# Patient Record
Sex: Female | Born: 1955 | Race: White | Hispanic: No | Marital: Married | Smoking: Current every day smoker
Health system: Southern US, Community
[De-identification: ages and names within clinical notes are randomized; demographics above are authoritative.]

## PROBLEM LIST (undated history)

## (undated) DIAGNOSIS — J189 Pneumonia, unspecified organism: Secondary | ICD-10-CM

## (undated) DIAGNOSIS — I10 Essential (primary) hypertension: Secondary | ICD-10-CM

## (undated) DIAGNOSIS — E039 Hypothyroidism, unspecified: Secondary | ICD-10-CM

## (undated) DIAGNOSIS — M4800 Spinal stenosis, site unspecified: Secondary | ICD-10-CM

## (undated) DIAGNOSIS — N189 Chronic kidney disease, unspecified: Secondary | ICD-10-CM

## (undated) DIAGNOSIS — G43019 Migraine without aura, intractable, without status migrainosus: Secondary | ICD-10-CM

## (undated) DIAGNOSIS — Z9889 Other specified postprocedural states: Secondary | ICD-10-CM

## (undated) DIAGNOSIS — G8929 Other chronic pain: Secondary | ICD-10-CM

## (undated) DIAGNOSIS — Z8701 Personal history of pneumonia (recurrent): Secondary | ICD-10-CM

## (undated) DIAGNOSIS — I5181 Takotsubo syndrome: Secondary | ICD-10-CM

## (undated) DIAGNOSIS — D473 Essential (hemorrhagic) thrombocythemia: Secondary | ICD-10-CM

## (undated) DIAGNOSIS — F419 Anxiety disorder, unspecified: Secondary | ICD-10-CM

## (undated) DIAGNOSIS — K219 Gastro-esophageal reflux disease without esophagitis: Secondary | ICD-10-CM

## (undated) DIAGNOSIS — E538 Deficiency of other specified B group vitamins: Secondary | ICD-10-CM

## (undated) DIAGNOSIS — F5104 Psychophysiologic insomnia: Secondary | ICD-10-CM

## (undated) DIAGNOSIS — N83209 Unspecified ovarian cyst, unspecified side: Secondary | ICD-10-CM

## (undated) HISTORY — DX: Other specified postprocedural states: Z98.890

## (undated) HISTORY — DX: Takotsubo syndrome: I51.81

## (undated) HISTORY — PX: CATARACT EXTRACTION: SUR2

## (undated) HISTORY — DX: Migraine without aura, intractable, without status migrainosus: G43.019

## (undated) HISTORY — PX: DOPPLER ECHOCARDIOGRAPHY: SHX263

## (undated) HISTORY — PX: LEEP: SHX91

## (undated) HISTORY — DX: Psychophysiologic insomnia: F51.04

## (undated) HISTORY — DX: Deficiency of other specified B group vitamins: E53.8

## (undated) HISTORY — PX: RETINAL DETACHMENT REPAIR W/ SCLERAL BUCKLE LE: SHX2338

## (undated) HISTORY — PX: GUM SURGERY: SHX658

## (undated) HISTORY — DX: Essential (hemorrhagic) thrombocythemia: D47.3

## (undated) HISTORY — PX: CHOLECYSTECTOMY: SHX55

---

## 1998-04-10 ENCOUNTER — Other Ambulatory Visit: Admission: RE | Admit: 1998-04-10 | Discharge: 1998-04-10 | Payer: Self-pay | Admitting: *Deleted

## 1998-05-14 ENCOUNTER — Ambulatory Visit (HOSPITAL_COMMUNITY): Admission: RE | Admit: 1998-05-14 | Discharge: 1998-05-14 | Payer: Self-pay | Admitting: *Deleted

## 1999-08-29 ENCOUNTER — Other Ambulatory Visit: Admission: RE | Admit: 1999-08-29 | Discharge: 1999-08-29 | Payer: Self-pay | Admitting: Obstetrics and Gynecology

## 1999-08-30 ENCOUNTER — Other Ambulatory Visit: Admission: RE | Admit: 1999-08-30 | Discharge: 1999-08-30 | Payer: Self-pay | Admitting: Obstetrics and Gynecology

## 1999-08-30 ENCOUNTER — Encounter (INDEPENDENT_AMBULATORY_CARE_PROVIDER_SITE_OTHER): Payer: Self-pay

## 2000-01-24 ENCOUNTER — Encounter: Admission: RE | Admit: 2000-01-24 | Discharge: 2000-01-24 | Payer: Self-pay | Admitting: Otolaryngology

## 2000-01-24 ENCOUNTER — Encounter: Payer: Self-pay | Admitting: Otolaryngology

## 2000-08-28 ENCOUNTER — Other Ambulatory Visit: Admission: RE | Admit: 2000-08-28 | Discharge: 2000-08-28 | Payer: Self-pay | Admitting: Obstetrics and Gynecology

## 2000-09-30 ENCOUNTER — Other Ambulatory Visit: Admission: RE | Admit: 2000-09-30 | Discharge: 2000-09-30 | Payer: Self-pay | Admitting: Obstetrics and Gynecology

## 2000-09-30 ENCOUNTER — Encounter (INDEPENDENT_AMBULATORY_CARE_PROVIDER_SITE_OTHER): Payer: Self-pay | Admitting: Specialist

## 2001-09-22 ENCOUNTER — Other Ambulatory Visit: Admission: RE | Admit: 2001-09-22 | Discharge: 2001-09-22 | Payer: Self-pay | Admitting: Obstetrics and Gynecology

## 2003-02-24 ENCOUNTER — Other Ambulatory Visit: Admission: RE | Admit: 2003-02-24 | Discharge: 2003-02-24 | Payer: Self-pay | Admitting: Obstetrics and Gynecology

## 2003-09-22 ENCOUNTER — Ambulatory Visit (HOSPITAL_COMMUNITY): Admission: RE | Admit: 2003-09-22 | Discharge: 2003-09-22 | Payer: Self-pay | Admitting: General Surgery

## 2004-07-10 ENCOUNTER — Other Ambulatory Visit: Admission: RE | Admit: 2004-07-10 | Discharge: 2004-07-10 | Payer: Self-pay | Admitting: Obstetrics and Gynecology

## 2005-06-13 ENCOUNTER — Ambulatory Visit (HOSPITAL_COMMUNITY): Admission: RE | Admit: 2005-06-13 | Discharge: 2005-06-13 | Payer: Self-pay | Admitting: Family Medicine

## 2005-07-30 ENCOUNTER — Other Ambulatory Visit: Admission: RE | Admit: 2005-07-30 | Discharge: 2005-07-30 | Payer: Self-pay | Admitting: Obstetrics and Gynecology

## 2006-09-15 ENCOUNTER — Ambulatory Visit (HOSPITAL_COMMUNITY): Admission: RE | Admit: 2006-09-15 | Discharge: 2006-09-15 | Payer: Self-pay | Admitting: Family Medicine

## 2007-02-01 ENCOUNTER — Ambulatory Visit (HOSPITAL_COMMUNITY): Admission: RE | Admit: 2007-02-01 | Discharge: 2007-02-01 | Payer: Self-pay | Admitting: Family Medicine

## 2008-03-24 ENCOUNTER — Ambulatory Visit (HOSPITAL_COMMUNITY): Admission: RE | Admit: 2008-03-24 | Discharge: 2008-03-24 | Payer: Self-pay | Admitting: *Deleted

## 2008-03-24 ENCOUNTER — Encounter (INDEPENDENT_AMBULATORY_CARE_PROVIDER_SITE_OTHER): Payer: Self-pay | Admitting: *Deleted

## 2008-04-07 ENCOUNTER — Ambulatory Visit (HOSPITAL_COMMUNITY): Admission: RE | Admit: 2008-04-07 | Discharge: 2008-04-07 | Payer: Self-pay | Admitting: Family Medicine

## 2008-06-28 ENCOUNTER — Ambulatory Visit (HOSPITAL_COMMUNITY): Admission: RE | Admit: 2008-06-28 | Discharge: 2008-06-28 | Payer: Self-pay | Admitting: Family Medicine

## 2008-07-03 ENCOUNTER — Ambulatory Visit (HOSPITAL_COMMUNITY): Admission: RE | Admit: 2008-07-03 | Discharge: 2008-07-03 | Payer: Self-pay | Admitting: Family Medicine

## 2008-12-02 ENCOUNTER — Emergency Department (HOSPITAL_COMMUNITY): Admission: EM | Admit: 2008-12-02 | Discharge: 2008-12-02 | Payer: Self-pay | Admitting: Emergency Medicine

## 2009-05-09 LAB — LAB REPORT - SCANNED
EGFR: 60
TSH: 0.69 (ref 0.41–5.90)

## 2009-10-14 ENCOUNTER — Emergency Department (HOSPITAL_COMMUNITY): Admission: EM | Admit: 2009-10-14 | Discharge: 2009-10-14 | Payer: Self-pay | Admitting: Emergency Medicine

## 2010-02-18 LAB — HEMOGLOBIN A1C: A1c: 5.6

## 2010-05-17 LAB — LAB REPORT - SCANNED: TSH: 0.91 (ref 0.41–5.90)

## 2010-09-03 NOTE — Op Note (Signed)
NAMELYNDSEY, LICHTMAN                 ACCOUNT NO.:  [id number]   MEDICAL RECORD NO.:  [id number]          PATIENT TYPE:  AMB   LOCATION:  ENDO                         FACILITY:  [facility]   PHYSICIAN:  Waverly Ferrari, M.D.    DATE OF BIRTH:  24-Jan-1956   DATE OF PROCEDURE:  DATE OF DISCHARGE:                               OPERATIVE REPORT   PROCEDURE:  Upper endoscopy.   INDICATIONS:  GERD.   ANESTHESIA:  Fentanyl 100 mcg, Versed 7 mg.   PROCEDURE:  With the patient mildly sedated in the left lateral  decubitus position, the Pentax videoscopic endoscope was inserted in the  mouth, passed under direct vision through the esophagus which appeared  normal until we reached the distal esophagus and there was a question of  an area of a tongue of Barrett esophagus, photographed and biopsied.  We  entered into the stomach.  Fundus, body, antrum, duodenal bulb, and  second portion of the duodenum all appeared normal.  From this point the  endoscope was slowly withdrawn, taking circumferential views of duodenal  mucosa until the endoscope had been pulled back into stomach, placed in  retroflexion to view the stomach from below.  The endoscope was  straightened and withdrawn, taking circumferential views of remaining  gastric and esophageal mucosa.  The patient's vital signs and pulse  oximeter remained stable.  The patient tolerated the procedure well  without apparent complications.   FINDINGS:  Loose wrap around the endoscope indicating laxity of the  lower esophageal sphincter and question of Barrett esophagus, biopsied.  Await biopsy report.  The patient will call me for results and follow up  with me as an outpatient.  Proceed to colonoscopy as planned.           ______________________________  Waverly Ferrari, M.D.     GMO/MEDQ  D:  03/24/2008  T:  03/24/2008  Job:  [id number]   cc:   Bonne Dolores, M.D.  Fax: [phone]

## 2010-09-03 NOTE — Op Note (Signed)
NAMEMALIEA, HORGER                 ACCOUNT NO.:  [id number]   MEDICAL RECORD NO.:  [id number]          PATIENT TYPE:  AMB   LOCATION:  ENDO                         [facility]ACILITY:  [facility]   PHYSICIAN:  Waverly Ferrari, M.D.    DATE OF BIRTH:  17-Feb-1956   DATE OF PROCEDURE:  DATE OF DISCHARGE:                               OPERATIVE REPORT   PROCEDURE:  Colonoscopy.   INDICATIONS:  Diarrhea, colon cancer screening.   ANESTHESIA:  1. Fentanyl 35 mcg.  2. Versed 5 mg.   PROCEDURE:  With the patient mildly sedated in the left lateral  decubitus position, the Pentax videoscopic pediatric colonoscope was  inserted in the rectum and passed under direct vision with pressure  applied.  The patient turned to her back, to her right side, and  subsequently back to her back and left side.  We were able to reach the  cecum, identified by ileocecal valve and appendiceal orifice, both of  which were photographed.  From this point the colonoscope was slowly  withdrawn, taking circumferential views of the colonic mucosa, stopping  only in the rectum other than stopping for random biopsies of normal-  appearing mucosa because of the diarrhea.  The rectum appeared normal on  direct and showed hemorrhoids on retroflexed view.  The endoscope was  straightened and withdrawn.  The patient's vital signs and pulse  oximeter remained stable.  The patient tolerated the procedure well  without apparent complication.   FINDINGS:  Internal hemorrhoids, otherwise unremarkable examination.  Biopsies taken from normal-appearing mucosa to evaluate for her  diarrhea.           ______________________________  Waverly Ferrari, M.D.     GMO/MEDQ  D:  03/24/2008  T:  03/24/2008  Job:  [id number]   cc:   Bonne Dolores, M.D.  Fax: [phone]

## 2010-09-06 NOTE — Op Note (Signed)
Rita Stephens, Rita Stephens                           ACCOUNT NO.:  [id number]   MEDICAL RECORD NO.:  [id number]                   PATIENT TYPE:  AMB   LOCATION:  DAY                                  FACILITY:  [facility]   PHYSICIAN:  Jamesetta So, M.D.               DATE OF BIRTH:  [birth year]   DATE OF PROCEDURE:  09/22/2003  DATE OF DISCHARGE:                                 OPERATIVE REPORT   PREOPERATIVE DIAGNOSIS:  Cyst, left elbow.   POSTOPERATIVE DIAGNOSIS:  Cyst, left elbow, 1.5 cm.   PROCEDURE:  Excision of cyst, left elbow.   SURGEON:  Jamesetta So, M.D.   ANESTHESIA:  Regional block.   INDICATIONS FOR PROCEDURE:  The patient is a 55 year old white female who  presents with an enlarging mass just distal to her left elbow.  The risks  and benefits of the procedure, including bleeding, infection, and recurrence  of the cyst were fully explained to the patient who gave informed consent.   DESCRIPTION OF PROCEDURE:  The patient was placed in the supine position.  A  block with tourniquet was performed by anesthesia.  The left elbow was then  prepped and draped using the usual sterile technique with Betadine.  Surgical site confirmation was performed.   A longitudinal incision was made over the mass down to the subcutaneous  tissue.  The mass was excised without difficulty.  Any bleeding was  controlled using Bovie electrocautery.  The skin was reapproximated using 4-  0 nylon interrupted suture.  Betadine ointment and dry sterile dressings  were applied.  Sensorcaine 0.5% was instilled into the surrounding wound.   All tape and needle counts were correct at the end of the procedure.  The  patient was transferred to day surgery in stable condition.   COMPLICATIONS:  None.   SPECIMENS:  Cyst, left elbow   ESTIMATED BLOOD LOSS:  None.      ___________________________________________                                            Jamesetta So, M.D.   MAJ/MEDQ  D:   09/22/2003  T:  09/22/2003  Job:  [id number]   cc:   Bonne Dolores, M.D.  [address], [place] [address]  Fax: [phone]

## 2010-09-06 NOTE — H&P (Signed)
NAME:  Rita Stephens, Rita Stephens                           ACCOUNT NO.:  [id number]   MEDICAL RECORD NO.:  [id number]                   PATIENT TYPE:  AMB   LOCATION:  DAY                                  FACILITY:  [facility]   PHYSICIAN:  Jamesetta So, M.D.               DATE OF BIRTH:  02-23-1956   DATE OF ADMISSION:  DATE OF DISCHARGE:                                HISTORY & PHYSICAL   CHIEF COMPLAINT:  Enlarging mass, left forearm.   HISTORY OF PRESENT ILLNESS:  The patient is a 55 year old white female who  was referred for evaluation and treatment of a mass on her left forearm.  It  has been present for some time, but recently it has increased in size and is  causing her discomfort.  No drainage has been noted.  She would like it  remove.   PAST MEDICAL HISTORY:  1. Depression.  2. __________allergies.   PAST SURGICAL HISTORY:  Laparoscopic cholecystectomy in 1996.   CURRENT MEDICATIONS:  1. Zyrtec.  2. Lexapro.  3. Xanax.   ALLERGIES:  SUDAFED.   REVIEW OF SYSTEMS:  The patient does smoke a pack of cigarettes a day.  She  denies any significant alcohol use.  She denies any other cardiopulmonary  difficulties or bleeding disorders.   PHYSICAL EXAMINATION:  The patient is a well-developed, well-nourished white  female, in no acute distress.  VITAL SIGNS:  She is afebrile.  Vitals signs are stable.  LUNGS:  Clear to auscultation with equal breath sounds bilaterally.  HEART:  Examination reveals a regular rate and rhythm without S3, S4, or  murmurs.  EXTREMITIES:  Examination reveals a bi-lobed, mobile, 2 to 3 cm subcutaneous  mass on the left forearm, dorsal aspect, just distal to the elbow.  No  punctum is present.   IMPRESSION:  Cyst, left forearm.   PLAN:  The patient is scheduled for excision of the mass, left forearm on  September 22, 2003.  The risks and benefits of the procedure, including bleeding,  infection and recurrence of the mass, were full explained to the patient,  who gave informed consent.     ___________________________________________                                         Jamesetta So, M.D.   MAJ/MEDQ  D:  09/21/2003  T:  09/21/2003  Job:  [id number]   cc:   Bonne Dolores, M.D.  [address] [place]  [place] [address]  Fax: [phone]

## 2011-04-03 ENCOUNTER — Other Ambulatory Visit (HOSPITAL_COMMUNITY): Payer: Self-pay | Admitting: Internal Medicine

## 2011-04-03 ENCOUNTER — Encounter: Payer: Self-pay | Admitting: Internal Medicine

## 2011-04-03 ENCOUNTER — Ambulatory Visit (HOSPITAL_COMMUNITY)
Admission: RE | Admit: 2011-04-03 | Discharge: 2011-04-03 | Disposition: A | Discharge status: Home / Self Care | Payer: 59 | Source: Ambulatory Visit | Attending: Internal Medicine | Admitting: Internal Medicine

## 2011-04-03 DIAGNOSIS — R079 Chest pain, unspecified: Secondary | ICD-10-CM | POA: Insufficient documentation

## 2011-04-03 DIAGNOSIS — M549 Dorsalgia, unspecified: Secondary | ICD-10-CM

## 2011-04-03 DIAGNOSIS — M546 Pain in thoracic spine: Secondary | ICD-10-CM | POA: Insufficient documentation

## 2011-04-10 ENCOUNTER — Other Ambulatory Visit (HOSPITAL_COMMUNITY): Payer: Self-pay | Admitting: Internal Medicine

## 2011-04-10 DIAGNOSIS — R945 Abnormal results of liver function studies: Secondary | ICD-10-CM

## 2011-04-16 ENCOUNTER — Ambulatory Visit (HOSPITAL_COMMUNITY)
Admission: RE | Admit: 2011-04-16 | Discharge: 2011-04-16 | Disposition: A | Discharge status: Home / Self Care | Payer: 59 | Source: Ambulatory Visit | Attending: Internal Medicine | Admitting: Internal Medicine

## 2011-04-16 DIAGNOSIS — R748 Abnormal levels of other serum enzymes: Secondary | ICD-10-CM | POA: Insufficient documentation

## 2011-04-16 DIAGNOSIS — R945 Abnormal results of liver function studies: Secondary | ICD-10-CM | POA: Insufficient documentation

## 2011-04-28 ENCOUNTER — Other Ambulatory Visit: Payer: Self-pay | Admitting: Obstetrics and Gynecology

## 2011-05-06 ENCOUNTER — Emergency Department (HOSPITAL_COMMUNITY): Payer: 59

## 2011-05-06 ENCOUNTER — Emergency Department (HOSPITAL_COMMUNITY)
Admission: EM | Admit: 2011-05-06 | Discharge: 2011-05-06 | Disposition: A | Discharge status: Home / Self Care | Payer: 59 | Attending: Emergency Medicine | Admitting: Emergency Medicine

## 2011-05-06 ENCOUNTER — Other Ambulatory Visit: Payer: Self-pay

## 2011-05-06 ENCOUNTER — Encounter (HOSPITAL_COMMUNITY): Payer: Self-pay | Admitting: *Deleted

## 2011-05-06 DIAGNOSIS — R0602 Shortness of breath: Secondary | ICD-10-CM | POA: Insufficient documentation

## 2011-05-06 DIAGNOSIS — Z79899 Other long term (current) drug therapy: Secondary | ICD-10-CM | POA: Insufficient documentation

## 2011-05-06 DIAGNOSIS — I10 Essential (primary) hypertension: Secondary | ICD-10-CM | POA: Insufficient documentation

## 2011-05-06 DIAGNOSIS — R0609 Other forms of dyspnea: Secondary | ICD-10-CM | POA: Insufficient documentation

## 2011-05-06 DIAGNOSIS — R079 Chest pain, unspecified: Secondary | ICD-10-CM | POA: Insufficient documentation

## 2011-05-06 DIAGNOSIS — R61 Generalized hyperhidrosis: Secondary | ICD-10-CM | POA: Insufficient documentation

## 2011-05-06 DIAGNOSIS — R0989 Other specified symptoms and signs involving the circulatory and respiratory systems: Secondary | ICD-10-CM | POA: Insufficient documentation

## 2011-05-06 DIAGNOSIS — F17200 Nicotine dependence, unspecified, uncomplicated: Secondary | ICD-10-CM | POA: Insufficient documentation

## 2011-05-06 LAB — BASIC METABOLIC PANEL
Calcium: 9.3 mg/dL (ref 8.4–10.5)
GFR calc Af Amer: >90 mL/min (ref >90)
GFR calc non Af Amer: >90 mL/min (ref >90)
Sodium: 129 mEq/L — ABNORMAL LOW (ref 135–145)

## 2011-05-06 LAB — TROPONIN I: Troponin I: <0.3 ng/mL (ref <0.30)

## 2011-05-06 LAB — CBC
MCV: 90.6 fL (ref 78.0–100.0)
Platelets: 346 10*3/uL (ref 150–400)
RDW: 13.1 % (ref 11.5–15.5)
WBC: 8.8 10*3/uL (ref 4.0–10.5)

## 2011-05-06 LAB — D-DIMER, QUANTITATIVE: D-Dimer, Quant: <0.22 ug/mL-FEU (ref 0.00–0.48)

## 2011-05-06 NOTE — ED Provider Notes (Signed)
History     CSN: [id number]  Arrival date & time 05/06/11  1111   First MD Initiated Contact with Patient 05/06/11 1226      Chief Complaint  Patient presents with  . Chest Pain  . Shortness of Breath    (Consider location/radiation/quality/duration/timing/severity/associated sxs/prior treatment) HPI  Patient states she has chest tightness, dyspnea, and sweating for months.  Pain is present constantly now for at least a month.  No increasing or decreasing factors except when she takes percocet.  Risk factors positive for smoking, hypertension, no family history of heart disease.    Past Medical History  Diagnosis Date  . Migraine   . Immune deficiency disorder     Past Surgical History  Procedure Date  . Cholecystectomy     No family history on file.  History  Substance Use Topics  . Smoking status: Current Everyday Smoker  . Smokeless tobacco: Not on file  . Alcohol Use: No    OB History    Grav Para Term Preterm Abortions TAB SAB Ect Mult Living                  Review of Systems  All other systems reviewed and are negative.    Allergies  Sudafed  Home Medications   Current Outpatient Rx  Name Route Sig Dispense Refill  . ALPRAZOLAM 1 MG PO TABS Oral Take 1 mg by mouth 3 (three) times daily as needed. For anxiety    . CALCIUM + D PO Oral Take 1 tablet by mouth daily.    . COQ-10 PO Oral Take 1 tablet by mouth daily.    . DULOXETINE HCL 60 MG PO CPEP Oral Take 60 mg by mouth daily.    [name] Kitchen LEVOTHYROXINE SODIUM 75 MCG PO TABS Oral Take 75 mcg by mouth daily.    [name] Kitchen METHOCARBAMOL 750 MG PO TABS Oral Take 750 mg by mouth 3 (three) times daily.    . OXYCODONE-ACETAMINOPHEN 7.5-325 MG PO TABS Oral Take 1 tablet by mouth every 4 (four) hours as needed. For pain    . TRAZODONE HCL 100 MG PO TABS Oral Take 100 mg by mouth at bedtime.    [name] Kitchen VITAMIN B-12 500 MCG PO TABS Oral Take 500 mcg by mouth daily.      BP 131/74  Pulse 71  Temp(Src) 98.6 F (37 C) (Oral)   Resp 20  SpO2 100%  Physical Exam  Vitals reviewed. Constitutional: She is oriented to person, place, and time. She appears well-developed and well-nourished.  HENT:  Head: Normocephalic and atraumatic.  Eyes: Conjunctivae and EOM are normal. Pupils are equal, round, and reactive to light.  Neck: Normal range of motion. Neck supple.  Cardiovascular: Normal rate, regular rhythm and normal heart sounds.   Pulmonary/Chest: Effort normal and breath sounds normal.  Abdominal: Soft. Bowel sounds are normal.  Neurological: She is alert and oriented to person, place, and time. She has normal reflexes.  Skin: Skin is warm and dry.  Psychiatric: She has a normal mood and affect.    ED Course  Procedures (including critical care time)   Labs Reviewed  CBC  BASIC METABOLIC PANEL  TROPONIN I   Dg Chest 2 View  05/06/2011  *RADIOLOGY REPORT*  Clinical Data: Chest and mid back pain.  CHEST - 2 VIEW  Comparison: PA and lateral chest 04/03/2011.  Findings: The lungs are clear.  Heart size is normal.  No pneumothorax or effusion.  No focal bony abnormality.  IMPRESSION: No acute disease.  Original Report Authenticated By: Arvid Right. Luther Parody, M.D.     No diagnosis found.   Date: 05/06/2011  Rate: 79  Rhythm: normal sinus rhythm  QRS Axis: right  Intervals: normal  ST/T Wave abnormalities: normal  Conduction Disutrbances:none  Narrative Interpretation:   Old EKG Reviewed: none available  Results for orders placed during the hospital encounter of [id number]  BASIC METABOLIC PANEL      Component Value Range   Sodium 129 (*) 135 - 145 (mEq/L)   Potassium 4.2  3.5 - 5.1 (mEq/L)   Chloride 92 (*) 96 - 112 (mEq/L)   CO2 29  19 - 32 (mEq/L)   Glucose, Bld 90  70 - 99 (mg/dL)   BUN 9  6 - 23 (mg/dL)   Creatinine, Ser 0.60  0.50 - 1.10 (mg/dL)   Calcium 9.3  8.4 - 10.5 (mg/dL)   GFR calc non Af Amer >90  >90 (mL/min)   GFR calc Af Amer >90  >90 (mL/min)  TROPONIN I      Component Value  Range   Troponin I <0.30  <0.30 (ng/mL)  CBC      Component Value Range   WBC 8.8  4.0 - 10.5 (K/uL)   RBC 4.14  3.87 - 5.11 (MIL/uL)   Hemoglobin 13.0  12.0 - 15.0 (g/dL)   HCT 37.5  36.0 - 46.0 (%)   MCV 90.6  78.0 - 100.0 (fL)   MCH 31.4  26.0 - 34.0 (pg)   MCHC 34.7  30.0 - 36.0 (g/dL)   RDW 13.1  11.5 - 15.5 (%)   Platelets 346  150 - 400 (K/uL)  D-DIMER, QUANTITATIVE      Component Value Range   D-Dimer, Quant <0.22  0.00 - 0.48 (ug/mL-FEU)     MDM  With pain that has been constant for several months which she states only results of Percocet. Followup arranged with cardiology prior to her evaluation here. The cardiac ischemia on EKG or. Chest x-Dodd Schmid is clear without any evidence of pneumothorax or infiltrate. Her d-dimer is normal.      12:46 PM Patient not in room yet.   Shaune Pollack, MD 05/06/11 [phone]

## 2011-05-06 NOTE — ED Notes (Signed)
Pt is here with intermittent chest pain with radiation and sts that it has been intermittent for couple of months.  Pt sts tightness in chest.  Reports sob and diaphoresis

## 2011-05-06 NOTE — ED Notes (Signed)
Pt discharged home. Had no further questions. Ambulatory to discharge.

## 2011-05-08 ENCOUNTER — Institutional Professional Consult (permissible substitution): Payer: 59 | Admitting: Cardiology

## 2011-05-23 ENCOUNTER — Encounter: Payer: Self-pay | Admitting: *Deleted

## 2011-05-26 ENCOUNTER — Ambulatory Visit (INDEPENDENT_AMBULATORY_CARE_PROVIDER_SITE_OTHER): Payer: 59 | Admitting: Internal Medicine

## 2011-05-26 ENCOUNTER — Telehealth: Payer: Self-pay | Admitting: Internal Medicine

## 2011-05-26 ENCOUNTER — Encounter: Payer: Self-pay | Admitting: Internal Medicine

## 2011-05-26 DIAGNOSIS — R079 Chest pain, unspecified: Secondary | ICD-10-CM | POA: Insufficient documentation

## 2011-05-26 DIAGNOSIS — Z720 Tobacco use: Secondary | ICD-10-CM

## 2011-05-26 DIAGNOSIS — Z0000 Encounter for general adult medical examination without abnormal findings: Secondary | ICD-10-CM | POA: Insufficient documentation

## 2011-05-26 DIAGNOSIS — F17200 Nicotine dependence, unspecified, uncomplicated: Secondary | ICD-10-CM

## 2011-05-26 DIAGNOSIS — I10 Essential (primary) hypertension: Secondary | ICD-10-CM | POA: Insufficient documentation

## 2011-05-26 MED ORDER — OMEPRAZOLE 40 MG PO CPDR: 40.0000 mg | DELAYED_RELEASE_CAPSULE | Freq: Every day | ORAL | Status: DC

## 2011-05-26 MED ORDER — VARENICLINE TARTRATE 1 MG PO TABS: 1.0000 mg | ORAL_TABLET | Freq: Two times a day (BID) | ORAL | Status: AC

## 2011-05-26 MED ORDER — VARENICLINE TARTRATE 0.5 MG PO TABS: 0.5000 mg | ORAL_TABLET | Freq: Two times a day (BID) | ORAL | Status: AC

## 2011-05-26 NOTE — Assessment & Plan Note (Signed)
Counselled  Willing to try Chantix.

## 2011-05-26 NOTE — Progress Notes (Signed)
HPI Patient is a 56 year old who was referred from the ER for evaluation of CP. The patient was seen in ER on 1/15.  Complained of chest tightness, dyspnea that was constant for at least a month.  Sent home for cardiac f/u. Still having discomfort  For at least 2 months.  Was constant.  Hurts to wear bra.    Bad in December.  Whole chest would be hurting.  Back hurts bad.  Sometimes get hot, sweat.  Occcasional R shoullder pain. Has a history of HTN, finally controlled.  CP is different from the chest tightness.  Chest pain mid and R upper chest.  occurs at least 1x per day. Not associated with activity.  Breathing is worse than it used to be.  Also not increased indigestion.  Used to be on meds for GERD.  Has had GB out. Does note increased stress.  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 Does go to gym  [place] in swimming pool.,  OCcasionally uses machines. Willing to try Chantix again  Did quit once with it. Allergies  Allergen Reactions  . Sudafed (Pseudoephedrine Hcl)     Climbs walls    Current Outpatient Prescriptions  Medication Sig Dispense Refill  . ALPRAZolam (XANAX) 1 MG tablet Take 1 mg by mouth 3 (three) times daily as needed. For anxiety      . Calcium Carbonate-Vitamin D (CALCIUM + D PO) Take 1 tablet by mouth daily.      . Coenzyme Q10 (COQ-10 PO) Take 1 tablet by mouth daily.      . DULoxetine (CYMBALTA) 60 MG capsule Take 60 mg by mouth daily.      [name] Kitchen levothyroxine (SYNTHROID, LEVOTHROID) 75 MCG tablet Take 75 mcg by mouth daily.      [name] Kitchen olmesartan-hydrochlorothiazide (BENICAR HCT) 20-12.5 MG per tablet Take 1 tablet by mouth daily.      . traZODone (DESYREL) 100 MG tablet Take 100 mg by mouth at bedtime.      . vitamin B-12 (CYANOCOBALAMIN) 500 MCG tablet Take 500 mcg by mouth daily.        Past Medical History  Diagnosis Date  . Migraine   . Immune deficiency disorder   . Chest pain   . HTN (hypertension)   . SOB (shortness of breath)     Past  Surgical History  Procedure Date  . Cholecystectomy   . Cardiac catheterization     No family history on file.  History   Social History  . Marital Status: Married    Spouse Name: N/A    Number of Children: N/A  . Years of Education: N/A   Occupational History  . Not on file.   Social History Main Topics  . Smoking status: Current Everyday Smoker  . Smokeless tobacco: Not on file  . Alcohol Use: No  . Drug Use: No  . Sexually Active:    Other Topics Concern  . Not on file   Social History Narrative  . No narrative on file    Review of Systems:  All systems reviewed.  They are negative to the above problem except as previously stated.  Vital Signs: BP 110/70  Pulse 71  Ht 5\' 2"  (1.575 m)  Wt 145 lb (65.772 kg)  BMI 26.52 kg/m2  Physical Exam Patient is in NAD HEENT:  Normocephalic, atraumatic. EOMI, PERRLA.  Neck: JVP is normal. No thyromegaly. No bruits.  Lungs: clear to auscultation.  Some deceased airflow.  No rales no wheezes.  Chest:  Mild tender in Right upper chest. Heart: Regular rate and rhythm. Normal S1, S2. No S3.   No significant murmurs. PMI not displaced.  Abdomen:  Supple, nontender. Normal bowel sounds. No masses. No hepatomegaly.  Extremities:   Good distal pulses throughout. No lower extremity edema.  Musculoskeletal :moving all extremities.  Neuro:   alert and oriented x3.  CN II-XII grossly intact.  EKG:  SR  71.    Assessment and Plan:

## 2011-05-26 NOTE — Assessment & Plan Note (Signed)
CP is very atypical.  I do nto think it represents cardiac ischemia.  Follow.  Counselled on exercise, quituitting tobacco.

## 2011-05-26 NOTE — Telephone Encounter (Signed)
New problem phamacy wants to verify quantity of chantix please call back

## 2011-05-26 NOTE — Patient Instructions (Addendum)
Call us in 3 weeks to let us know if your chest pain is better.  Fasting Lipid Panel at [facility]. We will call you with results.  Will let you know about Chantix.

## 2011-05-26 NOTE — Assessment & Plan Note (Signed)
Adequate control. 

## 2011-05-26 NOTE — Telephone Encounter (Signed)
Reviewed instructions for Chantix.

## 2011-05-29 ENCOUNTER — Encounter: Payer: Self-pay | Admitting: Internal Medicine

## 2011-05-29 ENCOUNTER — Other Ambulatory Visit: Payer: Self-pay | Admitting: Internal Medicine

## 2011-05-29 LAB — LIPID PANEL
Cholesterol: 185 mg/dL (ref 0–200)
LDL Cholesterol: 120 mg/dL — ABNORMAL HIGH (ref 0–99)
Triglycerides: 92 mg/dL (ref <150)

## 2011-07-03 ENCOUNTER — Telehealth: Payer: Self-pay | Admitting: Internal Medicine

## 2011-07-03 NOTE — Telephone Encounter (Signed)
Gave patient [place] number

## 2011-07-03 NOTE — Telephone Encounter (Signed)
New msg Pt wants to know number to nutritionist that she was referred to. Please call her back

## 2011-08-14 ENCOUNTER — Ambulatory Visit: Payer: 59 | Admitting: *Deleted

## 2012-02-06 LAB — LAB REPORT - SCANNED
EGFR: 85
TSH: 0.74 (ref 0.41–5.90)

## 2012-04-02 ENCOUNTER — Other Ambulatory Visit: Payer: Self-pay

## 2012-04-02 DIAGNOSIS — G4700 Insomnia, unspecified: Secondary | ICD-10-CM

## 2012-04-09 ENCOUNTER — Ambulatory Visit: Payer: 59 | Attending: Neurology | Admitting: Sleep Medicine

## 2012-04-09 DIAGNOSIS — R0609 Other forms of dyspnea: Secondary | ICD-10-CM | POA: Insufficient documentation

## 2012-04-09 DIAGNOSIS — G4700 Insomnia, unspecified: Secondary | ICD-10-CM

## 2012-04-09 DIAGNOSIS — R5381 Other malaise: Secondary | ICD-10-CM | POA: Insufficient documentation

## 2012-04-09 DIAGNOSIS — R0989 Other specified symptoms and signs involving the circulatory and respiratory systems: Secondary | ICD-10-CM | POA: Insufficient documentation

## 2012-04-24 NOTE — Procedures (Signed)
Rita A. Merlene Laughter, MD     [email or web address]        Rita Stephens, Rita Stephens                 ACCOUNT NO.:  [id number]  MEDICAL RECORD NO.:  [id number]          PATIENT TYPE:  OUT  LOCATION:  SLEEP LAB                     FACILITY:  [facility]  PHYSICIAN:  [name], M.D. DATE OF BIRTH:  [birth year]  DATE OF STUDY:  04/09/2012                           NOCTURNAL POLYSOMNOGRAM  REFERRING PHYSICIAN:  [name], M.D.  REFERRING PHYSICIAN:  [name], M.D.  INDICATION:  A 57 year old, who presents with fatigue, snoring, and difficulty sleeping.  The study is being done to evaluate for obstructive sleep apnea syndrome.  MEDICATIONS:  Alprazolam, Neurontin, trazodone, levothyroxine, melatonin, and Mobic.  EPWORTH SLEEPINESS SCALE:  0.  BMI:  21.  ARCHITECTURAL SUMMARY:  The total recording time is 422 minutes.  Sleep efficiency 96%, sleep latency 1.5 minutes, REM latency 128 minutes. Stage N1 3% N2 is 46%, N3 27% and REM sleep 24%.  RESPIRATORY SUMMARY:  Baseline oxygen saturation is 97, lowest saturation 83 during REM sleep.  Diagnostic AHI is 1.5 and RDI 2.2.  LIMB MOVEMENT SUMMARY:  PLM index 0.  ELECTROCARDIOGRAM SUMMARY:  Average heart rate is 69, with no significant dysrhythmias observed.  IMPRESSION:  Unremarkable nocturnal polysomnography.   Virgel Haro A. Merlene Stephens, M.D.    KAD/MEDQ  D:  04/24/2012 15:56:37  T:  04/24/2012 17:26:29  Job:  [id number]

## 2012-09-17 ENCOUNTER — Emergency Department (HOSPITAL_COMMUNITY)
Admission: EM | Admit: 2012-09-17 | Discharge: 2012-09-17 | Disposition: A | Discharge status: Home / Self Care | Payer: 59 | Attending: Emergency Medicine | Admitting: Emergency Medicine

## 2012-09-17 ENCOUNTER — Encounter (HOSPITAL_COMMUNITY): Payer: Self-pay | Admitting: *Deleted

## 2012-09-17 DIAGNOSIS — F17200 Nicotine dependence, unspecified, uncomplicated: Secondary | ICD-10-CM | POA: Insufficient documentation

## 2012-09-17 DIAGNOSIS — G43909 Migraine, unspecified, not intractable, without status migrainosus: Secondary | ICD-10-CM | POA: Insufficient documentation

## 2012-09-17 DIAGNOSIS — Z79899 Other long term (current) drug therapy: Secondary | ICD-10-CM | POA: Insufficient documentation

## 2012-09-17 DIAGNOSIS — R51 Headache: Secondary | ICD-10-CM | POA: Insufficient documentation

## 2012-09-17 DIAGNOSIS — Z8639 Personal history of other endocrine, nutritional and metabolic disease: Secondary | ICD-10-CM | POA: Insufficient documentation

## 2012-09-17 DIAGNOSIS — I10 Essential (primary) hypertension: Secondary | ICD-10-CM | POA: Insufficient documentation

## 2012-09-17 DIAGNOSIS — Z8709 Personal history of other diseases of the respiratory system: Secondary | ICD-10-CM | POA: Insufficient documentation

## 2012-09-17 DIAGNOSIS — Z862 Personal history of diseases of the blood and blood-forming organs and certain disorders involving the immune mechanism: Secondary | ICD-10-CM | POA: Insufficient documentation

## 2012-09-17 DIAGNOSIS — H53149 Visual discomfort, unspecified: Secondary | ICD-10-CM | POA: Insufficient documentation

## 2012-09-17 DIAGNOSIS — Z8679 Personal history of other diseases of the circulatory system: Secondary | ICD-10-CM | POA: Insufficient documentation

## 2012-09-17 DIAGNOSIS — Z9861 Coronary angioplasty status: Secondary | ICD-10-CM | POA: Insufficient documentation

## 2012-09-17 DIAGNOSIS — Z9089 Acquired absence of other organs: Secondary | ICD-10-CM | POA: Insufficient documentation

## 2012-09-17 DIAGNOSIS — R110 Nausea: Secondary | ICD-10-CM | POA: Insufficient documentation

## 2012-09-17 MED ORDER — DIPHENHYDRAMINE HCL 50 MG/ML IJ SOLN
25.0000 mg | Freq: Once | INTRAMUSCULAR | Status: AC
  Administered 2012-09-17: 25 mg via INTRAVENOUS
  Filled 2012-09-17: qty 1

## 2012-09-17 MED ORDER — OXYCODONE-ACETAMINOPHEN 5-325 MG PO TABS
1.0000 | ORAL_TABLET | Freq: Once | ORAL | Status: AC
  Administered 2012-09-17: 1 via ORAL
  Filled 2012-09-17: qty 1

## 2012-09-17 MED ORDER — DEXAMETHASONE SODIUM PHOSPHATE 4 MG/ML IJ SOLN
10.0000 mg | Freq: Once | INTRAMUSCULAR | Status: AC
  Administered 2012-09-17: 10 mg via INTRAVENOUS
  Filled 2012-09-17: qty 2
  Filled 2012-09-17: qty 1

## 2012-09-17 MED ORDER — METOCLOPRAMIDE HCL 5 MG/ML IJ SOLN
10.0000 mg | Freq: Once | INTRAMUSCULAR | Status: AC
  Administered 2012-09-17: 10 mg via INTRAVENOUS
  Filled 2012-09-17: qty 2

## 2012-09-17 MED ORDER — SODIUM CHLORIDE 0.9 % IV SOLN
INTRAVENOUS | Status: AC
  Administered 2012-09-17: 23:00:00 via INTRAVENOUS

## 2012-09-17 NOTE — ED Provider Notes (Signed)
History     CSN: [id number]  Arrival date & time 09/17/12  2032   First MD Initiated Contact with Patient 09/17/12 2152      Chief Complaint  Patient presents with  . Headache    (Consider location/radiation/quality/duration/timing/severity/associated sxs/prior treatment) Patient is a 57 y.o. female presenting with headaches. The history is provided by the patient.  Headache Pain location:  Frontal Radiates to:  Does not radiate Severity currently:  8/10 Onset quality:  Gradual Duration:  4 days Timing:  Constant Progression:  Worsening Chronicity:  New Similar to prior headaches: yes   Relieved by:  Nothing Ineffective treatments:  Aspirin Associated symptoms: nausea and photophobia   Associated symptoms: no abdominal pain, no fever, no neck pain, no sore throat and no vomiting    Rita Stephens is a 57 y.o. female who presents to the ED with a headache. The headache started 4 days ago. Yesterday it was so bad she went to her PCP and had 2 injections. She was better for a while but the the headache came back. This is like the migraines she usually has. She has nausea but no vomiting. The light and noise make the headache worse. The pain is located in the frontal area. She took a [facility] Powder earlier without relief.   Past Medical History  Diagnosis Date  . Migraine   . Immune deficiency disorder   . Chest pain   . HTN (hypertension)   . SOB (shortness of breath)     Past Surgical History  Procedure Laterality Date  . Cholecystectomy    . Cardiac catheterization      History reviewed. No pertinent family history.  History  Substance Use Topics  . Smoking status: Current Every Day Smoker  . Smokeless tobacco: Not on file  . Alcohol Use: No    OB History   Grav Para Term Preterm Abortions TAB SAB Ect Mult Living                  Review of Systems  Constitutional: Negative for fever and chills.  HENT: Negative for sore throat and neck pain.   Eyes: Positive  for photophobia.  Respiratory: Negative for shortness of breath.   Gastrointestinal: Positive for nausea. Negative for vomiting and abdominal pain.  Skin: Negative for rash.  Neurological: Positive for headaches.  Psychiatric/Behavioral: The patient is not nervous/anxious.     Allergies  Actifed cold-allergy; Sudafed; and Other  Home Medications   Current Outpatient Rx  Name  Route  Sig  Dispense  Refill  . ALPRAZolam (XANAX) 1 MG tablet   Oral   Take 1 mg by mouth 3 (three) times daily as needed. For anxiety         . aspirin-acetaminophen-caffeine (MIGRAINE FORMULA) 250-250-65 MG per tablet   Oral   Take 1 tablet by mouth once as needed for pain.         . Aspirin-Salicylamide-Caffeine (BC HEADACHE) 325-95-16 MG TABS   Oral   Take 1 Package by mouth daily as needed (for headache pain).         [name] Kitchen BIOTIN PO   Oral   Take 1 tablet by mouth daily.         . DULoxetine (CYMBALTA) 60 MG capsule   Oral   Take 60 mg by mouth daily.         [name] Kitchen levocetirizine (XYZAL) 5 MG tablet   Oral   Take 5 mg by mouth every evening.         [name] Kitchen  levothyroxine (SYNTHROID, LEVOTHROID) 112 MCG tablet   Oral   Take 112 mcg by mouth daily before breakfast.         . olmesartan (BENICAR) 40 MG tablet   Oral   Take 40 mg by mouth daily.         . traZODone (DESYREL) 100 MG tablet   Oral   Take 100 mg by mouth at bedtime.           BP 188/93  Pulse 68  Temp(Src) 97.2 F (36.2 C) (Oral)  Resp 18  Ht 5\' 2"  (1.575 m)  Wt 145 lb (65.772 kg)  BMI 26.51 kg/m2  SpO2 100%  Physical Exam  Nursing note and vitals reviewed. Constitutional: She is oriented to person, place, and time. She appears well-developed and well-nourished. No distress.  HENT:  Head: Normocephalic and atraumatic.  Right Ear: Tympanic membrane normal.  Left Ear: Tympanic membrane normal.  Nose: Nose normal. Right sinus exhibits no maxillary sinus tenderness and no frontal sinus tenderness. Left sinus  exhibits no maxillary sinus tenderness and no frontal sinus tenderness.  Mouth/Throat: Uvula is midline, oropharynx is clear and moist and mucous membranes are normal.  Eyes: Conjunctivae are normal. Pupils are equal, round, and reactive to light. Left eye exhibits nystagmus.  Neck: Normal range of motion. Neck supple.  Cardiovascular: Normal rate and regular rhythm.   Pulmonary/Chest: Effort normal.  Abdominal: Soft. Bowel sounds are normal. There is no tenderness.  Musculoskeletal: Normal range of motion.  Neurological: She is alert and oriented to person, place, and time. She has normal strength and normal reflexes. No cranial nerve deficit or sensory deficit. She displays a negative Romberg sign. Coordination and gait normal.  Skin: Skin is warm and dry.  Psychiatric: She has a normal mood and affect. Her behavior is normal. Judgment normal.    ED Course  Procedures (including critical care time) IV hydration, Reglan 10 mg IV, Benadryl 25 mg IV, Decadron 10 mg IV Observation. Feeling better since medication. Still has some pain on the right side, frontal. Will give percocet x1 tab.  MDM   Patient feeling better and ready to go home. Will d/c home and she will follow up with her doctor.  Discussed with the patient plan of care and all questioned fully answered. She will return if any problems arise.        Ashley [place], [place] 09/17/12 [phone]

## 2012-09-17 NOTE — ED Notes (Signed)
Pt alert & oriented x4, stable gait. Patient given discharge instructions, paperwork & prescription(s). Patient  instructed to stop at the registration desk to finish any additional paperwork. Patient verbalized understanding. Pt left department w/ no further questions. 

## 2012-09-17 NOTE — ED Notes (Signed)
Headache for 4 days, Seen by MD yesterday and given injections , but pain came back.  Nausea, no vomiting.   No HI.

## 2012-09-17 NOTE — ED Provider Notes (Signed)
Medical screening examination/treatment/procedure(s) were performed by non-physician practitioner and as supervising physician I was immediately available for consultation/collaboration.   Mervin Kung, MD 09/17/12 [phone]

## 2012-09-19 ENCOUNTER — Emergency Department (HOSPITAL_COMMUNITY)
Admission: EM | Admit: 2012-09-19 | Discharge: 2012-09-19 | Disposition: A | Discharge status: Home / Self Care | Payer: 59 | Attending: Emergency Medicine | Admitting: Emergency Medicine

## 2012-09-19 ENCOUNTER — Emergency Department (HOSPITAL_COMMUNITY): Payer: 59

## 2012-09-19 ENCOUNTER — Encounter (HOSPITAL_COMMUNITY): Payer: Self-pay | Admitting: Emergency Medicine

## 2012-09-19 DIAGNOSIS — F411 Generalized anxiety disorder: Secondary | ICD-10-CM | POA: Insufficient documentation

## 2012-09-19 DIAGNOSIS — Z8742 Personal history of other diseases of the female genital tract: Secondary | ICD-10-CM | POA: Insufficient documentation

## 2012-09-19 DIAGNOSIS — I10 Essential (primary) hypertension: Secondary | ICD-10-CM | POA: Insufficient documentation

## 2012-09-19 DIAGNOSIS — F17200 Nicotine dependence, unspecified, uncomplicated: Secondary | ICD-10-CM | POA: Insufficient documentation

## 2012-09-19 DIAGNOSIS — Z79899 Other long term (current) drug therapy: Secondary | ICD-10-CM | POA: Insufficient documentation

## 2012-09-19 DIAGNOSIS — E079 Disorder of thyroid, unspecified: Secondary | ICD-10-CM | POA: Insufficient documentation

## 2012-09-19 DIAGNOSIS — Z7982 Long term (current) use of aspirin: Secondary | ICD-10-CM | POA: Insufficient documentation

## 2012-09-19 DIAGNOSIS — Z8701 Personal history of pneumonia (recurrent): Secondary | ICD-10-CM | POA: Insufficient documentation

## 2012-09-19 DIAGNOSIS — G43909 Migraine, unspecified, not intractable, without status migrainosus: Secondary | ICD-10-CM | POA: Insufficient documentation

## 2012-09-19 HISTORY — DX: Unspecified ovarian cyst, unspecified side: N83.209

## 2012-09-19 HISTORY — DX: Anxiety disorder, unspecified: F41.9

## 2012-09-19 HISTORY — DX: Pneumonia, unspecified organism: J18.9

## 2012-09-19 MED ORDER — METOCLOPRAMIDE HCL 5 MG/ML IJ SOLN
10.0000 mg | Freq: Once | INTRAMUSCULAR | Status: AC
  Administered 2012-09-19: 10 mg via INTRAVENOUS
  Filled 2012-09-19: qty 2

## 2012-09-19 MED ORDER — DIPHENHYDRAMINE HCL 50 MG/ML IJ SOLN
25.0000 mg | Freq: Once | INTRAMUSCULAR | Status: AC
  Administered 2012-09-19: 25 mg via INTRAVENOUS
  Filled 2012-09-19: qty 1

## 2012-09-19 MED ORDER — KETOROLAC TROMETHAMINE 30 MG/ML IJ SOLN
30.0000 mg | Freq: Once | INTRAMUSCULAR | Status: AC
  Administered 2012-09-19: 30 mg via INTRAVENOUS
  Filled 2012-09-19: qty 1

## 2012-09-19 MED ORDER — SODIUM CHLORIDE 0.9 % IV BOLUS (SEPSIS)
1000.0000 mL | Freq: Once | INTRAVENOUS | Status: AC
  Administered 2012-09-19: 1000 mL via INTRAVENOUS

## 2012-09-19 NOTE — ED Notes (Signed)
Pt states headache is better after meds.

## 2012-09-19 NOTE — ED Provider Notes (Signed)
History  This chart was scribed for Ezequiel Essex, MD by Frederich Balding, ED Scribe. This patient was seen in room APA17/APA17 and the patient's care was started at 10:23 AM.  CSN: [id number]  Arrival date & time 09/19/12  1008    Chief Complaint  Patient presents with  . Migraine    The history is provided by the patient. No language interpreter was used.    HPI Comments: Rita Stephens is a 57 y.o. female who presents to the Emergency Department complaining of gradual onset, gradually worsening migraine that started Tuesday 5/26. She states that she saw her doctor on Thursday and received medication with some improvement but she states that her HA returned on Thursday. Pt states that she was seen here on Friday night for the same things. She states that her HA returned yesterday at 4 PM. Pt states she has has a h/o migraines and it's never been this bad. She states she is nauseous and she is sensitive to light and noise. Pt states she is not able to sleep. Pt denies fever, neck pain, sore throat, CP, cough, SOB, abdominal pain, emesis, diarrhea, urinary symptoms, back pain, weakness, numbness and rash as associated symptoms. She states that she sees spots. Pt states she normally take OTC medications for her migraines with no relief.   Past Medical History  Diagnosis Date  . Migraine   . Chest pain   . HTN (hypertension)   . SOB (shortness of breath)   . Pneumonia   . Anxiety   . Thyroid disease   . Ovarian cyst     Past Surgical History  Procedure Laterality Date  . Cholecystectomy    . Doppler echocardiography    . Ovarian biopsy      No family history on file.  History  Substance Use Topics  . Smoking status: Current Every Day Smoker  . Smokeless tobacco: Not on file  . Alcohol Use: No    OB History   Grav Para Term Preterm Abortions TAB SAB Ect Mult Living                  Review of Systems  A complete 10 system review of systems was obtained and all systems  are negative except as noted in the HPI and PMH.   Allergies  Actifed cold-allergy; Sudafed; and Other  Home Medications   Current Outpatient Rx  Name  Route  Sig  Dispense  Refill  . ALPRAZolam (XANAX) 1 MG tablet   Oral   Take 1 mg by mouth 3 (three) times daily as needed. For anxiety         . aspirin-acetaminophen-caffeine (MIGRAINE FORMULA) 250-250-65 MG per tablet   Oral   Take 1 tablet by mouth once as needed for pain.         . Aspirin-Salicylamide-Caffeine (BC HEADACHE) 325-95-16 MG TABS   Oral   Take 1 Package by mouth daily as needed (for headache pain).         [name] Kitchen BIOTIN PO   Oral   Take 1 tablet by mouth daily.         . DULoxetine (CYMBALTA) 60 MG capsule   Oral   Take 60 mg by mouth daily.         [name] Kitchen levocetirizine (XYZAL) 5 MG tablet   Oral   Take 5 mg by mouth every evening.         [name] Kitchen levothyroxine (SYNTHROID, LEVOTHROID) 112 MCG tablet   Oral  Take 112 mcg by mouth daily before breakfast.         . olmesartan (BENICAR) 40 MG tablet   Oral   Take 40 mg by mouth daily.         . traZODone (DESYREL) 100 MG tablet   Oral   Take 100 mg by mouth at bedtime.           BP 161/91  Pulse 68  Temp(Src) 97.6 F (36.4 C) (Oral)  Resp 20  SpO2 100%  Physical Exam  Nursing note and vitals reviewed. Constitutional: She is oriented to person, place, and time. She appears well-developed and well-nourished. No distress.  HENT:  Head: Normocephalic and atraumatic.  Mouth/Throat: Oropharynx is clear and moist.  Eyes: EOM are normal. Pupils are equal, round, and reactive to light.  Subtle horizontal nystagmus on right gaze.  No appreciable papilledema  Neck: Normal range of motion. Neck supple. No tracheal deviation present.  Cardiovascular: Normal rate, regular rhythm and normal heart sounds.   No murmur heard. Pulmonary/Chest: Effort normal. No respiratory distress.  Musculoskeletal: Normal range of motion. She exhibits no edema and no  tenderness.  Neurological: She is alert and oriented to person, place, and time.  No ataxia on finger to nose, 5/5 strength throughout. No meningismus.   Skin: Skin is warm and dry.  Psychiatric: She has a normal mood and affect. Her behavior is normal.    ED Course  Procedures (including critical care time)  DIAGNOSTIC STUDIES: Oxygen Saturation is 100% on RA, normal by my interpretation.    COORDINATION OF CARE: 10:36 AM-Discussed treatment plan with pt at bedside and pt agreed to plan.   12:55 PM- Upon recheck, pt states she does not need any extra pain medication and would like to go home.  Labs Reviewed - No data to display Ct Head Wo Contrast  09/19/2012   *RADIOLOGY REPORT*  Clinical Data: Headache,.  CT HEAD WITHOUT CONTRAST  Technique:  Contiguous axial images were obtained from the base of the skull through the vertex without contrast.  Comparison: 11/20/2010  Findings: Minimal parenchymal atrophy. Patchy areas of hypoattenuation in deep and periventricular white matter bilaterally, stable since prior study. Negative for acute intracranial hemorrhage, mass lesion, acute infarction, midline shift, or mass-effect. Acute infarct may be inapparent on noncontrast CT. Ventricles and sulci symmetric. Bone windows demonstrate no focal lesion.  IMPRESSION:  1. Negative for bleed or other acute intracranial process.  2.Nonspecific white matter changes.   Original Report Authenticated By: D. Wallace Going, MD     No diagnosis found.    MDM  Gradual onset headache similar to previous migraines. Recent visits to ED and PCP for same.  Denies thunderclap onset.  No focal neuro deficits.  States long history of migraines with previous MRIs and CTs.  Used to take topamax. But no home treatments now.  Relief of headache with toradol, reglan, benadryl, IV fluids. Patient feeling better and requesting discharge. Referral to neurology given.     I personally performed the services described  in this documentation, which was scribed in my presence. The recorded information has been reviewed and is accurate.   Ezequiel Essex, MD 09/19/12 [id number]

## 2012-09-19 NOTE — ED Notes (Signed)
States that she started having a migraine headache on Tuesday 5/26.  States that she saw her doctor on Thursday and received medication with some improvement, however, her headache returned on Friday.  States that she was seen here on Friday night for the same.  States that her headache returned yesterday at 4 pm.

## 2012-11-24 ENCOUNTER — Other Ambulatory Visit (HOSPITAL_COMMUNITY): Payer: Self-pay | Admitting: Internal Medicine

## 2012-11-24 DIAGNOSIS — M549 Dorsalgia, unspecified: Secondary | ICD-10-CM

## 2012-11-26 ENCOUNTER — Ambulatory Visit (HOSPITAL_COMMUNITY)
Admission: RE | Admit: 2012-11-26 | Discharge: 2012-11-26 | Disposition: A | Discharge status: Home / Self Care | Payer: 59 | Source: Ambulatory Visit | Attending: Internal Medicine | Admitting: Internal Medicine

## 2012-11-26 DIAGNOSIS — M545 Low back pain, unspecified: Secondary | ICD-10-CM | POA: Insufficient documentation

## 2012-11-26 DIAGNOSIS — M549 Dorsalgia, unspecified: Secondary | ICD-10-CM

## 2012-11-26 DIAGNOSIS — M5126 Other intervertebral disc displacement, lumbar region: Secondary | ICD-10-CM | POA: Insufficient documentation

## 2013-01-17 IMAGING — CR DG CHEST 2V
2 series · 2 of 2 positions shown · non-contrast
Comparison: None.

CLINICAL DATA: Chest pain.  Back pain.

CHEST - 2 VIEW

[view not recorded (1 of 2)]
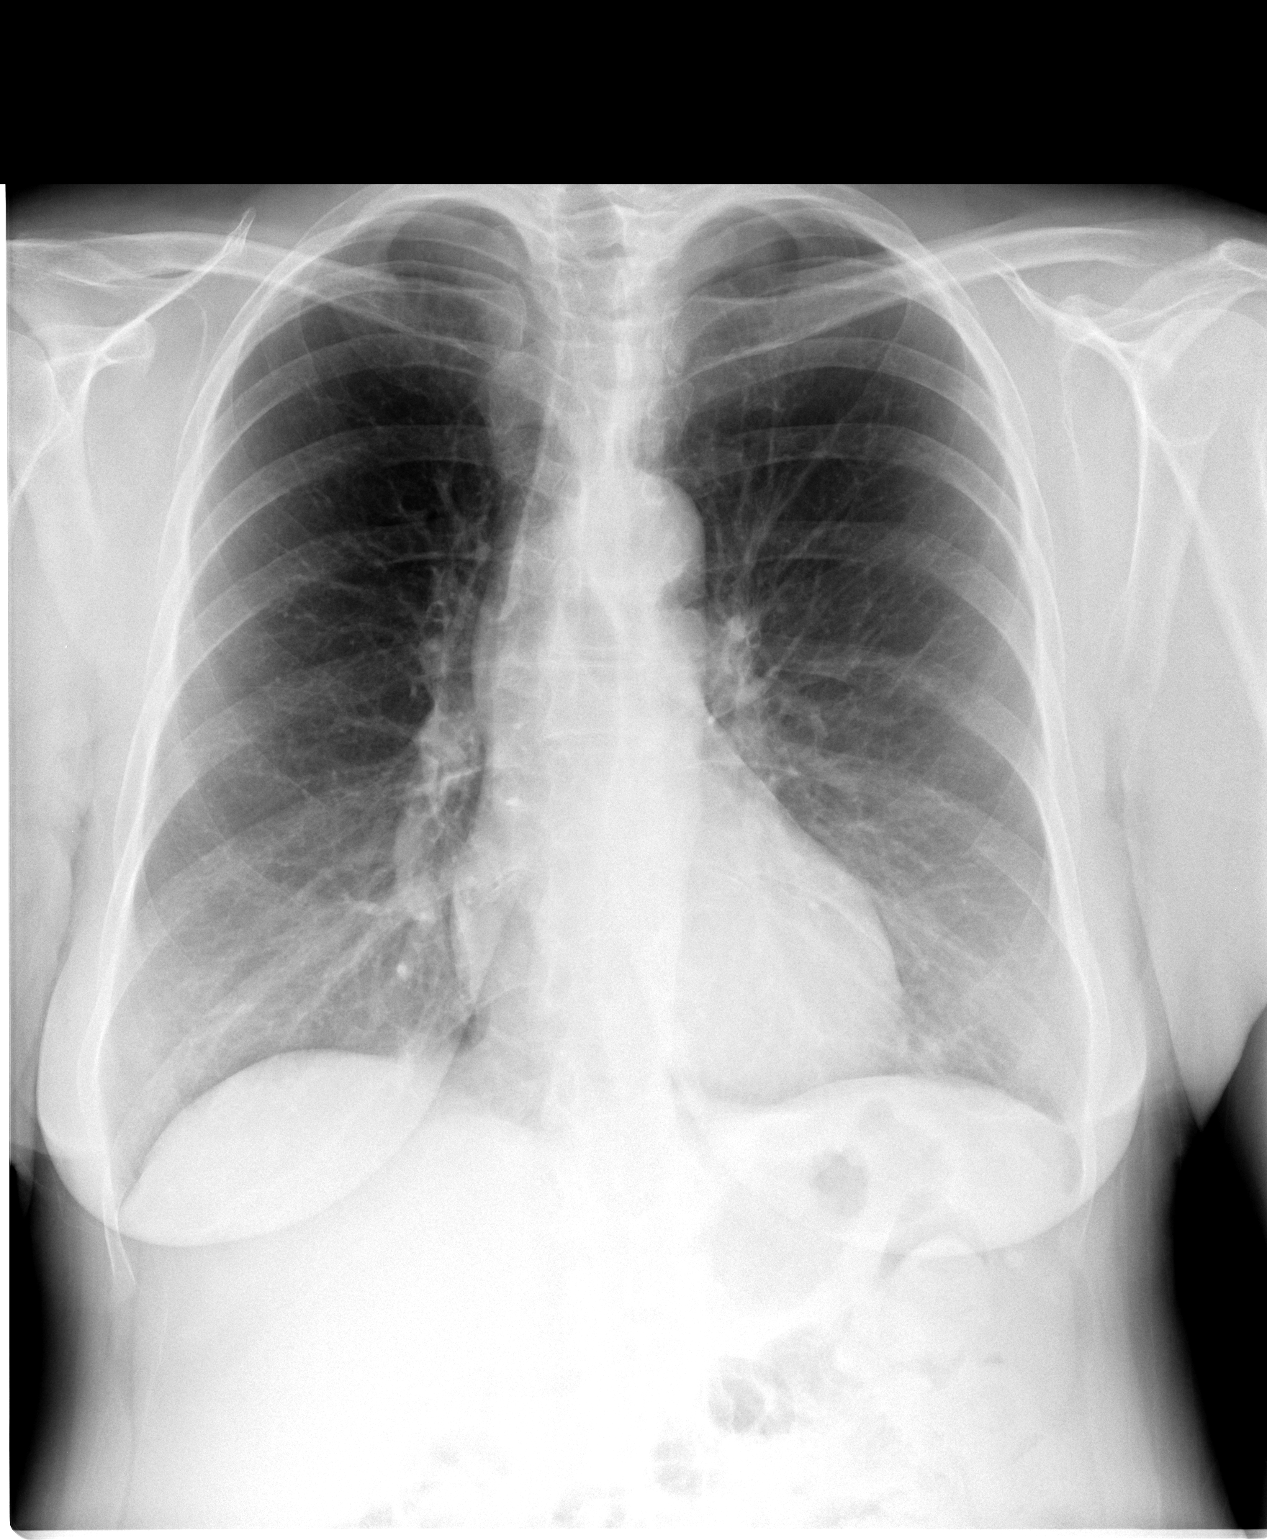

[view not recorded (2 of 2)]
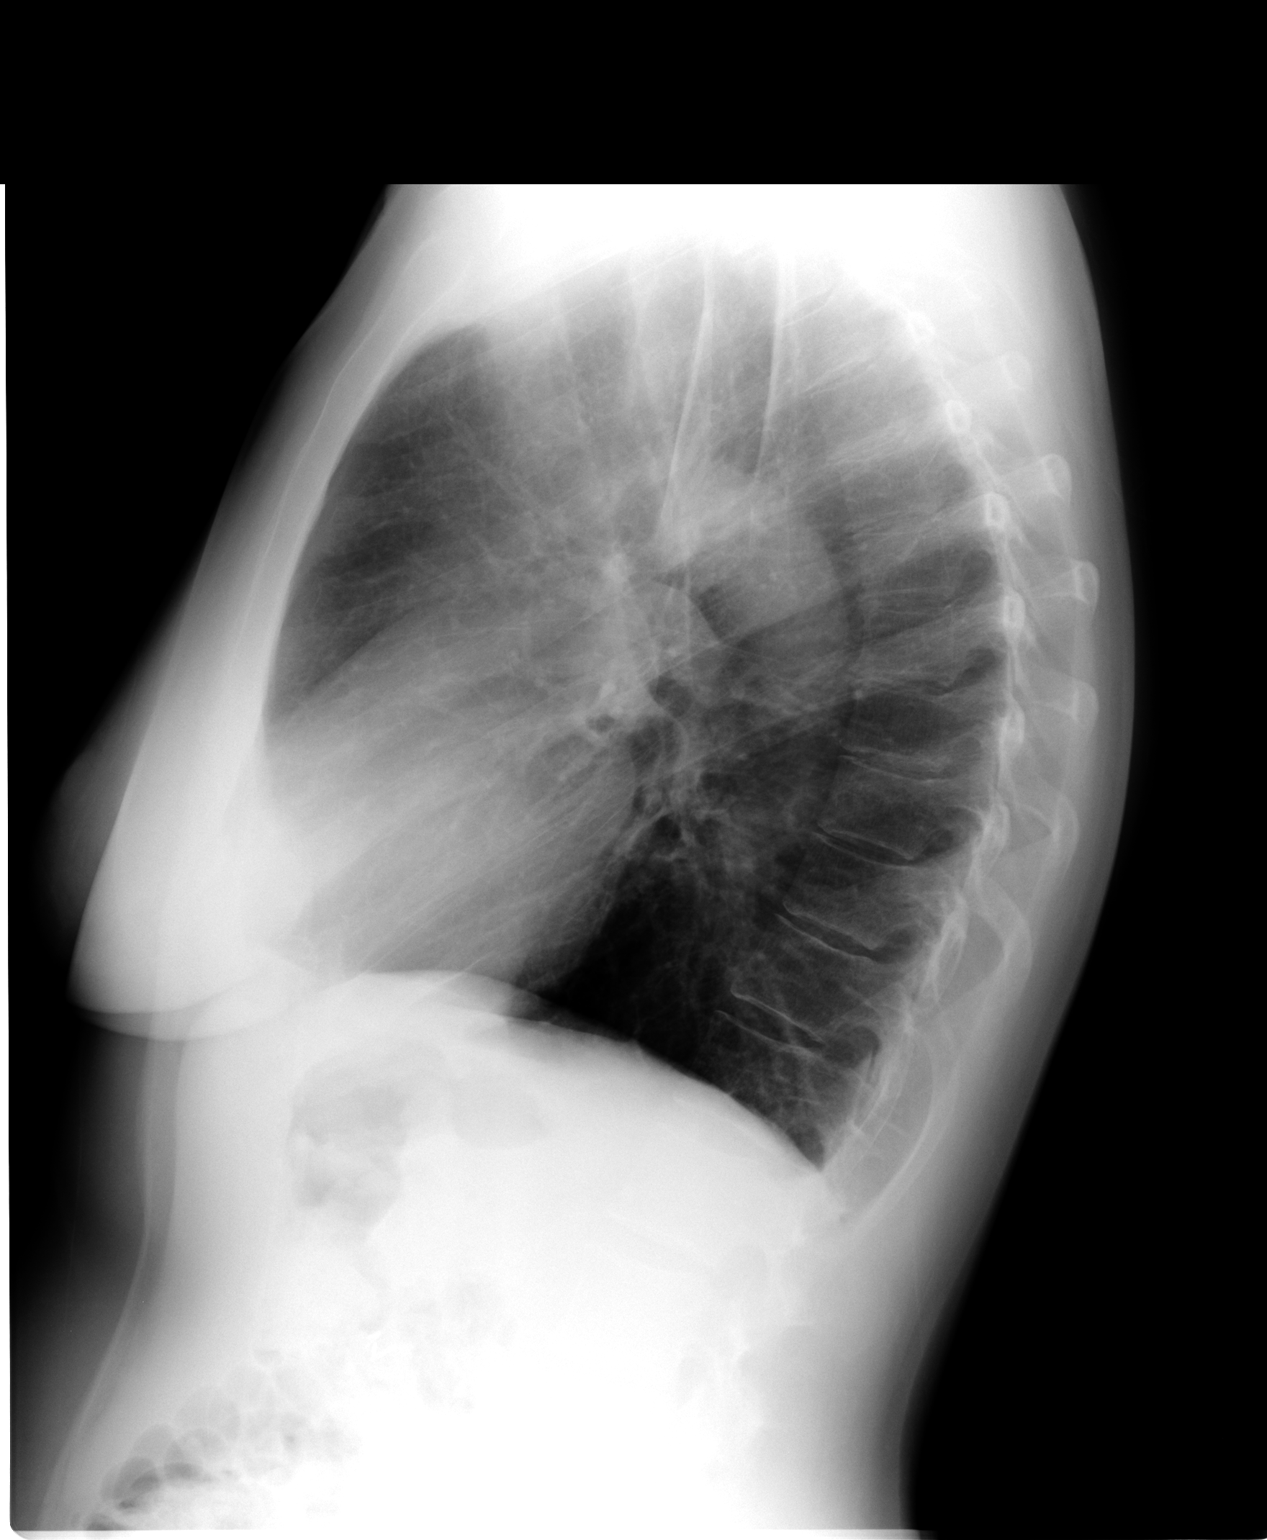

[2 of 2 positions shown; findings below may reference images not displayed]

FINDINGS: Lungs are clear.  Heart size is normal.  No pneumothorax
or effusion.
IMPRESSION: Negative chest.

## 2013-02-04 ENCOUNTER — Other Ambulatory Visit (HOSPITAL_COMMUNITY): Payer: Self-pay | Admitting: Internal Medicine

## 2013-02-04 ENCOUNTER — Ambulatory Visit (HOSPITAL_COMMUNITY)
Admission: RE | Admit: 2013-02-04 | Discharge: 2013-02-04 | Disposition: A | Discharge status: Home / Self Care | Payer: 59 | Source: Ambulatory Visit | Attending: Internal Medicine | Admitting: Internal Medicine

## 2013-02-04 DIAGNOSIS — R079 Chest pain, unspecified: Secondary | ICD-10-CM

## 2013-04-18 ENCOUNTER — Other Ambulatory Visit (HOSPITAL_COMMUNITY): Payer: Self-pay | Admitting: Internal Medicine

## 2013-04-18 DIAGNOSIS — M542 Cervicalgia: Secondary | ICD-10-CM

## 2013-04-18 DIAGNOSIS — R29898 Other symptoms and signs involving the musculoskeletal system: Secondary | ICD-10-CM

## 2013-04-18 DIAGNOSIS — M79602 Pain in left arm: Secondary | ICD-10-CM

## 2013-04-22 ENCOUNTER — Ambulatory Visit (HOSPITAL_COMMUNITY)
Admission: RE | Admit: 2013-04-22 | Discharge: 2013-04-22 | Disposition: A | Discharge status: Home / Self Care | Payer: 59 | Source: Ambulatory Visit | Attending: Internal Medicine | Admitting: Internal Medicine

## 2013-04-22 DIAGNOSIS — M542 Cervicalgia: Secondary | ICD-10-CM

## 2013-04-22 DIAGNOSIS — M5020 Other cervical disc displacement, unspecified cervical region: Secondary | ICD-10-CM | POA: Insufficient documentation

## 2013-04-22 DIAGNOSIS — M79602 Pain in left arm: Secondary | ICD-10-CM

## 2013-04-22 DIAGNOSIS — M4802 Spinal stenosis, cervical region: Secondary | ICD-10-CM | POA: Insufficient documentation

## 2013-04-22 DIAGNOSIS — M79609 Pain in unspecified limb: Secondary | ICD-10-CM | POA: Insufficient documentation

## 2013-04-22 DIAGNOSIS — R29898 Other symptoms and signs involving the musculoskeletal system: Secondary | ICD-10-CM

## 2013-04-22 DIAGNOSIS — R209 Unspecified disturbances of skin sensation: Secondary | ICD-10-CM | POA: Insufficient documentation

## 2013-04-22 DIAGNOSIS — M47812 Spondylosis without myelopathy or radiculopathy, cervical region: Secondary | ICD-10-CM | POA: Insufficient documentation

## 2013-04-27 ENCOUNTER — Ambulatory Visit: Payer: 59 | Admitting: Neurology

## 2013-05-04 ENCOUNTER — Encounter: Payer: Self-pay | Admitting: Neurology

## 2013-05-04 ENCOUNTER — Ambulatory Visit (INDEPENDENT_AMBULATORY_CARE_PROVIDER_SITE_OTHER): Payer: 59 | Admitting: Neurology

## 2013-05-04 VITALS — BP 158/78 | HR 80 | Resp 14 | Ht 62.0 in | Wt 152.3 lb

## 2013-05-04 DIAGNOSIS — R200 Anesthesia of skin: Secondary | ICD-10-CM

## 2013-05-04 DIAGNOSIS — R209 Unspecified disturbances of skin sensation: Secondary | ICD-10-CM

## 2013-05-04 DIAGNOSIS — M546 Pain in thoracic spine: Secondary | ICD-10-CM

## 2013-05-04 LAB — VITAMIN B12: Vitamin B-12: >1500 pg/mL — ABNORMAL HIGH (ref 211–911)

## 2013-05-04 LAB — TSH: TSH: 0.63 u[IU]/mL (ref 0.35–5.50)

## 2013-05-04 LAB — HEMOGLOBIN A1C: HEMOGLOBIN A1C: 5.7 % (ref 4.6–6.5)

## 2013-05-04 NOTE — Progress Notes (Signed)
[facility] Clinic Note - Initial Visit   Date: 05/04/2013    Rita Stephens MRN: [id number] DOB: 1955/11/20   Dear Dr Gerarda Fraction:  Thank you for your kind referral of Rita Stephens for consultation of left arm and leg pain. Although his history is well known to you, please allow [place] to reiterate it for the purpose of our medical record. The patient was accompanied to the clinic by self.   History of Present Illness: Rita Stephens is a 58 y.o. right-handed Caucasian female with history of migraines, hypertension, and anxiety presenting for evaluation of left arm and leg pain.  She reports having pain in the middle of her back since 2012 which worsened in the summer 2014.  Pain is described as constant throbbing/achiness with intermittent knife-like spells.  Achiness is localized to the level of her bra.  The knife-like pain occurs several times a day, lasting <5 minutes.  Pain is worse at the end of the day. Any type of activity (walking, grocery shopping, lifting thing) seems to worsen her pain.  Pain is relieved by pain medication, taking her bra off, and rest.    She also complains of left hand numbness which started about three weeks ago.  It is worse at night.  Denies any numbness of her feet. She reports having swelling of her right knee and right hip also.  She saw Dr. Sherwood Gambler, neurosurgeon, on 1/12 who reviewed her imaging and was told she has age-related changes of her spine which did not need any surgical intervention at this time.  He started her on nabumetone.    Upon review of her record, it seems that she has similar symptoms dating back to 2009 at which time she had imaging of her thoracic spine which was non-revealing.  She has previously had injections of her back in 2012 which did not help. She has tried neurontin and meloxicam without benefit.      She has been referred to see Dr. Alvan Dame in February for joint pain and is also planning to see  rheumatology.  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  Mild to moderate canal stenosis at C5-6, mild at C3-4 and C4-5.  Neural foraminal narrowing C3-4 thru C6-7: Moderate to severe on the right at C5-6.  MRI thoracic spine 04/07/2013: 1. Negative thoracic spine for age as detailed above.  2. Partially visualized cervical spondylosis. A mild degree of cervical spinal stenosis at C5-C6 is suggested.    Past Medical History  Diagnosis Date  . Migraine   . Chest pain   . HTN (hypertension)   . SOB (shortness of breath)   . Pneumonia   . Anxiety   . Thyroid disease   . Ovarian cyst     Past Surgical History  Procedure Laterality Date  . Cholecystectomy    . Doppler echocardiography    . Leep    . Gum surgery      removed cyst     Medications:  Current Outpatient Prescriptions on File Prior to Visit  Medication Sig Dispense Refill  . ALPRAZolam (XANAX) 1 MG tablet Take 1 mg by mouth 3 (three) times daily as needed for anxiety.       [name] Kitchen BIOTIN PO Take 1 tablet by mouth daily.      . DULoxetine (CYMBALTA)  60 MG capsule Take 60 mg by mouth daily.      [name] Kitchen levocetirizine (XYZAL) 5 MG tablet Take 5 mg by mouth every evening.      [name] Kitchen levothyroxine (SYNTHROID, LEVOTHROID) 112 MCG tablet Take 112 mcg by mouth daily before breakfast.      . olmesartan (BENICAR) 40 MG tablet Take 40 mg by mouth daily.      . traZODone (DESYREL) 100 MG tablet Take 200 mg by mouth at bedtime.        No current facility-administered medications on file prior to visit.    Allergies:  Allergies  Allergen Reactions  . Actifed Cold-Allergy [Chlorpheniramine-Phenyleph Er] Other (See Comments)    ALTERS MENTAL STATUS  . Sudafed [Pseudoephedrine Hcl] Other (See Comments)    Climbs walls  . Other  Palpitations    Topamax    Family History: Family History  Problem Relation Age of Onset  . Hypertension Father   . Breast cancer Mother   . Migraines Sister   . Migraines Brother     Social History: History   Social History  . Marital Status: Married    Spouse Name: N/A    Number of Children: N/A  . Years of Education: N/A   Occupational History  . Not on file.   Social History Main Topics  . Smoking status: Current Every Day Smoker -- 0.75 packs/day for 30 years  . Smokeless tobacco: Not on file  . Alcohol Use: No  . Drug Use: No  . Sexual Activity: Not on file   Other Topics Concern  . Not on file   Social History Narrative   She lives with husband and daughter.  She is working as a Radiation protection practitioner.          Review of Systems:  CONSTITUTIONAL: No fevers, chills, night sweats, or weight loss.   EYES: No visual changes or eye pain ENT: No hearing changes.  No history of nose bleeds.   RESPIRATORY: No cough, wheezing and shortness of breath.   CARDIOVASCULAR: Negative for chest pain, and palpitations.   GI: Negative for abdominal discomfort, blood in stools or black stools.  No recent change in bowel habits.   GU:  No history of incontinence.   MUSCLOSKELETAL: +history of joint pain or swelling.  No myalgias.   SKIN: Negative for lesions, rash, and itching.   HEMATOLOGY/ONCOLOGY: Negative for prolonged bleeding, bruising easily, and swollen nodes.    ENDOCRINE: Negative for cold or heat intolerance, polydipsia or goiter.   PSYCH:  +depression or anxiety symptoms.   NEURO: As Above.   Vital Signs:  BP 158/78  Pulse 80  Resp 14  Ht 5\' 2"  (1.575 m)  Wt 152 lb 5 oz (69.088 kg)  BMI 27.85 kg/m2   General Medical Exam:   General:  Well appearing, comfortable.   Eyes/ENT: see cranial nerve examination.   Neck: No masses appreciated.  Full range of motion without tenderness.  No carotid bruits. Respiratory:  Clear to auscultation, good air entry bilaterally.     Cardiac:  Regular rate and rhythm, no murmur.   Back:  Pain over the T5-7 paraspinal muscles    Extremities:  No deformities, edema, or skin discoloration..   Skin:  Skin color, texture, turgor normal. No rashes or lesions.  Neurological Exam: MENTAL STATUS including orientation to time, place, person, recent and remote memory, attention span and concentration, language, and fund of knowledge is normal.  Speech is not dysarthric.  CRANIAL NERVES: II:  No visual field defects.  Unremarkable fundi.   III-IV-VI: Pupils equal round and reactive to light.  Normal conjugate, extra-ocular eye movements in all directions of gaze.  No nystagmus.  No ptosis.   V:  Normal facial sensation.   VII:  Normal facial symmetry and movements.  VIII:  Normal hearing and vestibular function.   IX-X:  Normal palatal movement.   XI:  Normal shoulder shrug and head rotation.   XII:  Normal tongue strength and range of motion, no deviation or fasciculation.  MOTOR:  No atrophy, fasciculations or abnormal movements.  No pronator drift.  Tone is normal.    Right Upper Extremity:    Left Upper Extremity:    Deltoid  5/5   Deltoid  5/5   Biceps  5/5   Biceps  5/5   Triceps  5/5   Triceps  5/5   Wrist extensors  5/5   Wrist extensors  5/5   Wrist flexors  5/5   Wrist flexors  5/5   Finger extensors  5/5   Finger extensors  5/5   Finger flexors  5/5   Finger flexors  5/5   Dorsal interossei  5/5   Dorsal interossei  5/5   Abductor pollicis  5/5   Abductor pollicis  5/5   Tone (Ashworth scale)  0  Tone (Ashworth scale)  0   Right Lower Extremity:    Left Lower Extremity:    Hip flexors  5/5   Hip flexors  5/5   Hip extensors  5/5   Hip extensors  5/5   Knee flexors  5/5   Knee flexors  5/5   Knee extensors  5/5   Knee extensors  5/5   Dorsiflexors  5/5   Dorsiflexors  5/5   Plantarflexors  5/5   Plantarflexors  5/5   Toe extensors  5/5   Toe extensors  5/5   Toe flexors  5/5   Toe flexors  5/5   Tone  (Ashworth scale)  0  Tone (Ashworth scale)  0   MSRs:  Right                                                                 Left brachioradialis 2+  brachioradialis 2+  biceps 2+  biceps 2+  triceps 2+  triceps 2+  patellar 2+  patellar 2+  ankle jerk 2+  ankle jerk 2+  Hoffman no  Hoffman no  plantar response down  plantar response down   SENSORY:  Normal and symmetric perception of light touch, pinprick, vibration, and proprioception.  Romberg's sign absent.   COORDINATION/GAIT: Normal finger-to- nose-finger and heel-to-shin.  Intact rapid alternating movements bilaterally.  Able to rise from a chair without using arms.  Gait narrow based and stable. Tandem and stressed gait intact.    IMPRESSION: Ms. Eid is a 58 year-old female presenting for evaluation of thoracic back pain and paresthesias of her left hand.  Her exam is non-focal.  Prior imaging of her thoracic spine in 2009 was reviewed and does not show any abnormalities.  She had MRI of the cervical spine performed most recently which shows multilevel foraminal stenosis, worse at C5-6 on the right.  Given that she has tenderness over the thoracic paraspinal  muscles, her pain is most likely musculoskeletal in origin and less likely a thoracic radiculopathy (non-radiating quality makes herpetic neuralgia less likely also).  However, I will study the paraspinal muscles on EMG to look for any active motor axon loss.   Left hand symptoms may be due to cervical radiculopathy vs CTS. EMG of the left side will help differentiate this.     PLAN/RECOMMENDATIONS:  1.  Check TSH, vitamin B12, copper, HbA1c 2.  EMG of the left upper extremity with additional needle study of the thoracic spine 3.  Return to clinic in 6-weeks   The duration of this appointment visit was 45 minutes of face-to-face time with the patient.  Greater than 50% of this time was spent in counseling, explanation of diagnosis, planning of further management, and  coordination of care.   Thank you for allowing me to participate in patient's care.  If I can answer any additional questions, I would be pleased to do so.    Sincerely,    Donika K. Posey Pronto, DO

## 2013-05-04 NOTE — Patient Instructions (Signed)
1.  Check blood work today 2.  EMG of the left upper extremity 3.  Return to clinic in 6-weeks

## 2013-05-05 NOTE — Progress Notes (Signed)
faxed

## 2013-05-06 LAB — COPPER, SERUM: COPPER: 142 ug/dL (ref 70–175)

## 2013-05-25 ENCOUNTER — Ambulatory Visit: Payer: 59 | Admitting: Neurology

## 2013-06-01 ENCOUNTER — Ambulatory Visit (INDEPENDENT_AMBULATORY_CARE_PROVIDER_SITE_OTHER): Payer: 59 | Admitting: Neurology

## 2013-06-01 ENCOUNTER — Encounter: Payer: Self-pay | Admitting: Neurology

## 2013-06-01 DIAGNOSIS — G5600 Carpal tunnel syndrome, unspecified upper limb: Secondary | ICD-10-CM

## 2013-06-01 NOTE — Procedures (Signed)
[facility]  [address]  [place], [place] [address] Tel: [phone] Fax:  [phone] Test Date:  06/01/2013  Patient: Rita Stephens DOB: 1956-01-18 Physician: Narda Amber, DO  Sex: Female Height: 5\' 2"  Ref Phys: Narda Amber  ID#: [id number] Temp: 36.2C Technician:    Patient Complaints: This is a 58 year-old female presenting with left arm pain and numbness and mid-thoracic back pain.  NCV & EMG Findings: Extensive evaluation of the left upper extremity and thoracic paraspinal muscles (T7 and T11 levels) reveals: 1. The left median sensory nerve showed prolonged distal peak latency (4.5 ms) and reduced amplitude (5.0 V).  The ulnar and radial sensory responses are normal. 2. Left median motor nerve showed prolonged distal onset latency (4.4 ms).  Proximal median motor response was increased (10.9 mV) while the motor amplitude at the wrist was reduced (9.5 mV).  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  The ulnar motor response recorded at the abductor digiti minimi is normal. 3. No evidence of active or chronic motor axon loss changes affecting the tested muscles, including thoracic paraspinal muscles.  Impression: There is electrophysiologic evidence of a left median neuropathy at or distal to the wrist consistent with the clinical diagnosis with carpal tunnel syndrome, moderate in degree electrically.  Incidentally, there is a left Martin-Gruber anastomosis which is a normal variant.   ___________________________ Narda Amber, DO    Nerve Conduction Studies Anti Sensory Summary Table   Site NR Peak (ms) Norm Peak (ms) P-T Amp (V) Norm P-T Amp  Left Median Anti Sensory (2nd Digit)  Wrist    4.5 <3.6 5.0 >15  Left Radial Anti Sensory (Base 1st Digit)  Wrist    2.2 <2.7 20.2 >14  Left Ulnar Anti Sensory (5th Digit)  Wrist    2.8 <3.1 39.8  >10   Motor Summary Table   Site NR Onset (ms) Norm Onset (ms) O-P Amp (mV) Norm O-P Amp Site1 Site2 Delta-0 (ms) Dist (cm) Vel (m/s) Norm Vel (m/s)  Left Median Motor (Abd Poll Brev)  Wrist    4.4 <4.0 9.5 >6 Elbow Wrist 5.2 26.0 50 >50  Elbow    9.6  10.9  Ulnar-wrist cross-over Elbow 6.0 0.0    Ulnar-wrist cross-over    3.6  4.9         Left Ulnar Motor (Abd Dig Minimi)  Wrist    2.4 <3.1 12.6 >7 B Elbow Wrist 3.3 22.0 67 >50  B Elbow    5.7  10.8  A Elbow B Elbow 2.0 10.0 50 >50  A Elbow    7.7  10.0          EMG   Side Muscle Ins Act Fibs Psw Fasc Number Recrt Dur Dur. Amp Amp. Poly Poly. Comment  Left 1stDorInt Nml Nml Nml Nml Nml Nml Nml Nml Nml Nml Nml Nml N/A  Left Abd Poll Brev Nml Nml Nml Nml Nml Nml Nml Nml Nml Nml Nml Nml N/A  Left Ext Indicis Nml Nml Nml Nml Nml Nml Nml Nml Nml Nml Nml Nml N/A  Left PronatorTeres Nml Nml Nml Nml Nml Nml Nml Nml Nml Nml Nml Nml N/A  Left Biceps Nml Nml Nml Nml Nml Nml Nml Nml Nml Nml Nml Nml N/A  Left Triceps Nml Nml Nml Nml Nml Nml Nml Nml Nml Nml Nml Nml N/A  Left Deltoid Nml Nml  Nml Nml Nml Nml Nml Nml Nml Nml Nml Nml N/A  Left FlexPolLong Nml Nml Nml Nml Nml Nml Nml Nml Nml Nml Nml Nml N/A  Left Thoracic Parasp Mid Nml Nml Nml Nml 1- Mod-V Nml Nml Nml Nml Nml Nml N/A  Left Thoracic Parasp Low Nml Nml Nml Nml 1- Mod Nml Nml Nml Nml Nml Nml N/A      Waveforms:

## 2013-06-01 NOTE — Progress Notes (Signed)
See procedure note for EMG results.  Rita Gilpatrick K. Hulen Mandler, DO  

## 2013-06-02 ENCOUNTER — Encounter: Payer: 59 | Admitting: Neurology

## 2013-06-21 ENCOUNTER — Ambulatory Visit: Payer: 59 | Admitting: Neurology

## 2013-07-18 ENCOUNTER — Encounter: Payer: Self-pay | Admitting: Neurology

## 2013-07-18 ENCOUNTER — Ambulatory Visit (INDEPENDENT_AMBULATORY_CARE_PROVIDER_SITE_OTHER): Payer: 59 | Admitting: Neurology

## 2013-07-18 VITALS — BP 148/84 | HR 85 | Wt 141.2 lb

## 2013-07-18 DIAGNOSIS — G5600 Carpal tunnel syndrome, unspecified upper limb: Secondary | ICD-10-CM

## 2013-07-18 DIAGNOSIS — M5412 Radiculopathy, cervical region: Secondary | ICD-10-CM

## 2013-07-18 NOTE — Progress Notes (Signed)
Follow-up Visit   Date: 07/18/2013    Rita Stephens MRN: [id number] DOB: [birth year]   Interim History: Rita Stephens is a 58 y.o. right-handed Caucasian female with history of migraines, hypertension, and anxiety returning to the clinic for follow-up of left arm and leg pain.  The patient was accompanied to the clinic by self.  She was last seen in the clinic on 05/04/2013.  History of present illness: She reports having pain in the middle of her back since 2012 which worsened in the summer 2014. Pain is described as constant throbbing/achiness with intermittent knife-like spells. Achiness is localized to the level of her bra. The knife-like pain occurs several times a day, lasting <5 minutes. Pain is worse at the end of the day. Any type of activity (walking, grocery shopping, lifting thing) seems to worsen her pain. Pain is relieved by pain medication, taking her bra off, and rest. She saw Dr. Sherwood Gambler, neurosurgeon, on 1/12 who reviewed her imaging and was told she has age-related changes of her spine which did not need any surgical intervention at this time. He started her on nabumetone.   She also complains of left hand numbness which started about three weeks ago. It is worse at night. Denies any numbness of her feet. She reports having swelling of her right knee and right hip also.   Upon review of her record, it seems that she has similar symptoms dating back to 2009 at which time she had imaging of her thoracic spine which was non-revealing. She has previously had injections of her back in 2012 which did not help. She has tried neurontin and meloxicam without benefit.    - Follow-up 07/18/2013:  She continues to have back pain and seems disappointed with the extensive work-up she has received thus far.  She saw Dr. Trudie Reed (rheumatology) in February said her symptoms are most likely due to stenosis in her back.    Medications:  Current Outpatient Prescriptions on File Prior to  Visit  Medication Sig Dispense Refill  . ALPRAZolam (XANAX) 1 MG tablet Take 1 mg by mouth 3 (three) times daily as needed for anxiety.       [name] Kitchen BIOTIN PO Take 1 tablet by mouth daily.      . hydrochlorothiazide (MICROZIDE) 12.5 MG capsule       . levocetirizine (XYZAL) 5 MG tablet Take 5 mg by mouth every evening.      [name] Kitchen levothyroxine (SYNTHROID, LEVOTHROID) 112 MCG tablet Take 112 mcg by mouth daily before breakfast.      . nabumetone (RELAFEN) 500 MG tablet       . olmesartan (BENICAR) 40 MG tablet Take 40 mg by mouth daily.      [name] Kitchen oxyCODONE (ROXICODONE) 15 MG immediate release tablet Take 15 mg by mouth every 4 (four) hours as needed for pain.       No current facility-administered medications on file prior to visit.    Allergies:  Allergies  Allergen Reactions  . Actifed Cold-Allergy [Chlorpheniramine-Phenyleph Er] Other (See Comments)    ALTERS MENTAL STATUS  . Sudafed [Pseudoephedrine Hcl] Other (See Comments)    Climbs walls  . Other Palpitations    Topamax     Review of Systems:  CONSTITUTIONAL: No fevers, chills, night sweats, or weight loss.   EYES: No visual changes or eye pain ENT: No hearing changes.  No history of nose bleeds.   RESPIRATORY: No cough, wheezing and shortness of breath.   CARDIOVASCULAR: Negative  for chest pain, and palpitations.   GI: Negative for abdominal discomfort, blood in stools or black stools.  No recent change in bowel habits.   GU:  No history of incontinence.   MUSCLOSKELETAL: +history of joint pain or swelling.  +myalgias.   SKIN: Negative for lesions, rash, and itching.   ENDOCRINE: Negative for cold or heat intolerance, polydipsia or goiter.   PSYCH:  + depression or anxiety symptoms.   NEURO: As Above.   Vital Signs:  BP 148/84  Pulse 85  Wt 141 lb 4 oz (64.071 kg)  SpO2 92%  Neurological Exam: MENTAL STATUS including orientation to time, place, person, recent and remote memory, attention span and concentration, language,  and fund of knowledge is normal.    CRANIAL NERVES: Pupils equal round and reactive to light.  Normal conjugate, extra-ocular eye movements in all directions of gaze.  Face is symmetric.  MOTOR:  Motor strength is 5/5 in all extremities  MSRs:  Reflexes are 2+/4 throughout  SENSORY:  Intact to light touch and vibration.  COORDINATION/GAIT:  Antalgic gait   Data: Lab Results  Component Value Date   TSH 0.63 05/04/2013   Lab Results  Component Value Date   VITAMINB12 >1500* 05/04/2013   Lab Results  Component Value Date   HGBA1C 5.7 05/04/2013    EMG of the left upper extremity with additional needle study of the thoracic paraspinal muscles 06/01/2013:   There is electrophysiologic evidence of a left median neuropathy at or distal to the wrist consistent with the clinical diagnosis with carpal tunnel syndrome, moderate in degree electrically.   Incidentally, there is a left Martin-Gruber anastomosis which is a normal variant.  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  Mild to moderate canal stenosis at C5-6, mild at C3-4 and C4-5.  Neural foraminal narrowing C3-4 thru C6-7: Moderate to severe on the right at C5-6.  MRI thoracic spine 04/07/2008:  1. Negative thoracic spine for age as detailed above.  2. Partially visualized cervical spondylosis. A mild degree of cervical spinal stenosis at C5-C6 is suggested.     IMPRESSION/PLAN: 1.  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 2.  Left carpal tunnel syndrome, moderate  - Recommended wearing wrist splint during the day and  night.    - Avoid hyperflexion  - Discussed that of symptoms do not improve, trial of steroid injection can be given 3.  Return to clinic as needed   The duration of this appointment visit was 18 minutes of face-to-face time with the patient.  Greater than 50% of this time was spent in counseling, explanation of diagnosis, planning of further management, and coordination of care.   Thank you for allowing me to participate in patient's care.  If I can answer any additional questions, I would be pleased to do so.    Sincerely,    Donika K. Posey Pronto, DO

## 2013-07-18 NOTE — Patient Instructions (Signed)
Recommended to use wrist splint during the day and night. Avoid hyperflexion of the wrist.

## 2013-10-19 ENCOUNTER — Emergency Department (HOSPITAL_COMMUNITY): Payer: 59

## 2013-10-19 ENCOUNTER — Emergency Department (HOSPITAL_COMMUNITY)
Admission: EM | Admit: 2013-10-19 | Discharge: 2013-10-19 | Disposition: A | Discharge status: Home / Self Care | Payer: 59 | Attending: Emergency Medicine | Admitting: Emergency Medicine

## 2013-10-19 DIAGNOSIS — IMO0002 Reserved for concepts with insufficient information to code with codable children: Secondary | ICD-10-CM | POA: Insufficient documentation

## 2013-10-19 DIAGNOSIS — M549 Dorsalgia, unspecified: Secondary | ICD-10-CM

## 2013-10-19 DIAGNOSIS — E079 Disorder of thyroid, unspecified: Secondary | ICD-10-CM | POA: Insufficient documentation

## 2013-10-19 DIAGNOSIS — Z8742 Personal history of other diseases of the female genital tract: Secondary | ICD-10-CM | POA: Insufficient documentation

## 2013-10-19 DIAGNOSIS — Z79899 Other long term (current) drug therapy: Secondary | ICD-10-CM | POA: Insufficient documentation

## 2013-10-19 DIAGNOSIS — Z8701 Personal history of pneumonia (recurrent): Secondary | ICD-10-CM | POA: Insufficient documentation

## 2013-10-19 DIAGNOSIS — F411 Generalized anxiety disorder: Secondary | ICD-10-CM | POA: Insufficient documentation

## 2013-10-19 DIAGNOSIS — Y92410 Unspecified street and highway as the place of occurrence of the external cause: Secondary | ICD-10-CM | POA: Insufficient documentation

## 2013-10-19 DIAGNOSIS — I10 Essential (primary) hypertension: Secondary | ICD-10-CM | POA: Insufficient documentation

## 2013-10-19 DIAGNOSIS — G43909 Migraine, unspecified, not intractable, without status migrainosus: Secondary | ICD-10-CM | POA: Insufficient documentation

## 2013-10-19 DIAGNOSIS — Y9389 Activity, other specified: Secondary | ICD-10-CM | POA: Insufficient documentation

## 2013-10-19 DIAGNOSIS — F17200 Nicotine dependence, unspecified, uncomplicated: Secondary | ICD-10-CM | POA: Insufficient documentation

## 2013-10-19 MED ORDER — IBUPROFEN 800 MG PO TABS: 800.0000 mg | ORAL_TABLET | Freq: Three times a day (TID) | ORAL | Status: DC
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
## 2013-10-19 NOTE — ED Notes (Signed)
Off spinal board, with Ward at bedside. Collar remains in place. 98% RA.
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
## 2013-11-01 LAB — LAB REPORT - SCANNED
EGFR: 89
TSH: 0.62 (ref 0.41–5.90)

## 2014-02-23 LAB — LAB REPORT - SCANNED: TSH: 2.39 (ref 0.41–5.90)

## 2014-03-30 ENCOUNTER — Institutional Professional Consult (permissible substitution): Payer: Self-pay | Admitting: Neurology

## 2014-03-30 ENCOUNTER — Telehealth: Payer: Self-pay | Admitting: Neurology

## 2014-03-30 NOTE — Telephone Encounter (Signed)
Patient is a no show for today's appointment at 10:00(03/30/14)

## 2014-03-31 ENCOUNTER — Encounter (HOSPITAL_COMMUNITY): Payer: 59

## 2014-03-31 ENCOUNTER — Encounter (HOSPITAL_COMMUNITY)
Admission: RE | Admit: 2014-03-31 | Discharge: 2014-03-31 | Disposition: A | Discharge status: Home / Self Care | Payer: 59 | Source: Ambulatory Visit | Attending: "Endocrinology | Admitting: "Endocrinology

## 2014-03-31 DIAGNOSIS — E2740 Unspecified adrenocortical insufficiency: Secondary | ICD-10-CM | POA: Insufficient documentation

## 2014-03-31 MED ORDER — SODIUM CHLORIDE 0.9 % IJ SOLN
10.0000 mL | Freq: Once | INTRAMUSCULAR | Status: AC
  Administered 2014-03-31: 10 mL via INTRAVENOUS

## 2014-03-31 MED ORDER — COSYNTROPIN 0.25 MG IJ SOLR
0.2500 mg | Freq: Once | INTRAMUSCULAR | Status: AC
  Administered 2014-03-31: 0.25 mg via INTRAVENOUS
  Filled 2014-03-31: qty 0.25

## 2014-03-31 NOTE — Progress Notes (Signed)
Pt arrived to Short Stay clinic for ACTH test. States she had eaten prior to coming to hospital per [place] Dr Dorris Fetch. Test continued.

## 2014-04-01 LAB — ACTH STIMULATION, 3 TIME POINTS
Cortisol, 30 Min: 6.7 ug/dL — ABNORMAL LOW (ref >20.0)
Cortisol, 60 Min: 9.6 ug/dL — ABNORMAL LOW (ref >20)
Cortisol, Base: 1.4 ug/dL

## 2014-04-03 NOTE — Progress Notes (Signed)
Results for DANAPAOLA, FURBEE (MRN [id number]) as of 04/03/2014 08:34  Ref. Range 03/31/2014 10:00  Cortisol, Base No range found 1.4  Cortisol, 30 Min Latest Range: >20.0 ug/dL 6.7 (L)  Cortisol, 60 Min Latest Range: >20 ug/dL 9.6 (L)

## 2014-04-04 ENCOUNTER — Encounter: Payer: Self-pay | Admitting: Neurology

## 2014-04-26 ENCOUNTER — Other Ambulatory Visit: Payer: Self-pay | Admitting: Obstetrics and Gynecology

## 2014-04-26 LAB — HM MAMMOGRAPHY

## 2014-05-01 LAB — CYTOLOGY - PAP

## 2014-05-16 LAB — LAB REPORT - SCANNED: TSH: 0.2 — AB (ref 0.41–5.90)

## 2014-06-16 ENCOUNTER — Ambulatory Visit
Admission: RE | Admit: 2014-06-16 | Discharge: 2014-06-16 | Disposition: A | Discharge status: Home / Self Care | Payer: 59 | Source: Ambulatory Visit | Attending: Physician Assistant | Admitting: Physician Assistant

## 2014-06-16 ENCOUNTER — Other Ambulatory Visit: Payer: Self-pay | Admitting: Physician Assistant

## 2014-06-16 DIAGNOSIS — M546 Pain in thoracic spine: Secondary | ICD-10-CM

## 2014-06-19 LAB — LAB REPORT - SCANNED
EGFR: 93
TSH: 0.24 — AB (ref 0.41–5.90)

## 2014-06-21 ENCOUNTER — Other Ambulatory Visit (HOSPITAL_COMMUNITY): Payer: Self-pay | Admitting: Internal Medicine

## 2014-06-21 DIAGNOSIS — R945 Abnormal results of liver function studies: Secondary | ICD-10-CM

## 2014-06-23 ENCOUNTER — Ambulatory Visit (HOSPITAL_COMMUNITY)
Admission: RE | Admit: 2014-06-23 | Discharge: 2014-06-23 | Disposition: A | Discharge status: Home / Self Care | Payer: 59 | Source: Ambulatory Visit | Attending: Internal Medicine | Admitting: Internal Medicine

## 2014-06-23 DIAGNOSIS — Z9049 Acquired absence of other specified parts of digestive tract: Secondary | ICD-10-CM | POA: Diagnosis not present

## 2014-06-23 DIAGNOSIS — R945 Abnormal results of liver function studies: Secondary | ICD-10-CM

## 2014-09-21 LAB — TSH: TSH: 2.04 (ref 0.41–5.90)

## 2014-10-31 LAB — LAB REPORT - SCANNED
EGFR: 98
TSH: 0.37 — AB (ref 0.41–5.90)

## 2015-02-21 ENCOUNTER — Other Ambulatory Visit (HOSPITAL_COMMUNITY): Payer: Self-pay | Admitting: Internal Medicine

## 2015-02-22 ENCOUNTER — Inpatient Hospital Stay (HOSPITAL_COMMUNITY)
Admission: EM | Admit: 2015-02-22 | Discharge: 2015-02-25 | DRG: 281 | Disposition: A | Discharge status: Home / Self Care | Payer: Commercial Managed Care - HMO | Attending: Cardiology | Admitting: Cardiology

## 2015-02-22 ENCOUNTER — Encounter (HOSPITAL_COMMUNITY): Payer: Self-pay | Admitting: Emergency Medicine

## 2015-02-22 ENCOUNTER — Emergency Department (HOSPITAL_COMMUNITY): Payer: Commercial Managed Care - HMO

## 2015-02-22 DIAGNOSIS — Z23 Encounter for immunization: Secondary | ICD-10-CM | POA: Diagnosis not present

## 2015-02-22 DIAGNOSIS — R197 Diarrhea, unspecified: Secondary | ICD-10-CM | POA: Insufficient documentation

## 2015-02-22 DIAGNOSIS — G43909 Migraine, unspecified, not intractable, without status migrainosus: Secondary | ICD-10-CM | POA: Diagnosis present

## 2015-02-22 DIAGNOSIS — G8929 Other chronic pain: Secondary | ICD-10-CM | POA: Diagnosis present

## 2015-02-22 DIAGNOSIS — I429 Cardiomyopathy, unspecified: Secondary | ICD-10-CM | POA: Diagnosis not present

## 2015-02-22 DIAGNOSIS — R4182 Altered mental status, unspecified: Secondary | ICD-10-CM | POA: Diagnosis not present

## 2015-02-22 DIAGNOSIS — G40209 Localization-related (focal) (partial) symptomatic epilepsy and epileptic syndromes with complex partial seizures, not intractable, without status epilepticus: Secondary | ICD-10-CM | POA: Diagnosis not present

## 2015-02-22 DIAGNOSIS — F17210 Nicotine dependence, cigarettes, uncomplicated: Secondary | ICD-10-CM | POA: Diagnosis present

## 2015-02-22 DIAGNOSIS — I214 Non-ST elevation (NSTEMI) myocardial infarction: Secondary | ICD-10-CM | POA: Diagnosis present

## 2015-02-22 DIAGNOSIS — I249 Acute ischemic heart disease, unspecified: Secondary | ICD-10-CM | POA: Insufficient documentation

## 2015-02-22 DIAGNOSIS — I213 ST elevation (STEMI) myocardial infarction of unspecified site: Secondary | ICD-10-CM | POA: Diagnosis not present

## 2015-02-22 DIAGNOSIS — E785 Hyperlipidemia, unspecified: Secondary | ICD-10-CM | POA: Diagnosis present

## 2015-02-22 DIAGNOSIS — F1310 Sedative, hypnotic or anxiolytic abuse, uncomplicated: Secondary | ICD-10-CM

## 2015-02-22 DIAGNOSIS — I5181 Takotsubo syndrome: Secondary | ICD-10-CM | POA: Diagnosis present

## 2015-02-22 DIAGNOSIS — I10 Essential (primary) hypertension: Secondary | ICD-10-CM

## 2015-02-22 DIAGNOSIS — G4089 Other seizures: Secondary | ICD-10-CM | POA: Diagnosis present

## 2015-02-22 DIAGNOSIS — E039 Hypothyroidism, unspecified: Secondary | ICD-10-CM | POA: Diagnosis present

## 2015-02-22 DIAGNOSIS — F419 Anxiety disorder, unspecified: Secondary | ICD-10-CM | POA: Diagnosis present

## 2015-02-22 DIAGNOSIS — D72829 Elevated white blood cell count, unspecified: Secondary | ICD-10-CM | POA: Insufficient documentation

## 2015-02-22 DIAGNOSIS — R569 Unspecified convulsions: Secondary | ICD-10-CM | POA: Diagnosis present

## 2015-02-22 DIAGNOSIS — M4800 Spinal stenosis, site unspecified: Secondary | ICD-10-CM | POA: Diagnosis present

## 2015-02-22 DIAGNOSIS — E876 Hypokalemia: Secondary | ICD-10-CM | POA: Diagnosis not present

## 2015-02-22 HISTORY — DX: Spinal stenosis, site unspecified: M48.00

## 2015-02-22 HISTORY — DX: Essential (primary) hypertension: I10

## 2015-02-22 HISTORY — DX: Other chronic pain: G89.29

## 2015-02-22 HISTORY — DX: Hypothyroidism, unspecified: E03.9

## 2015-02-22 HISTORY — DX: Personal history of pneumonia (recurrent): Z87.01

## 2015-02-22 LAB — BASIC METABOLIC PANEL
Anion gap: 14 (ref 5–15)
BUN: 12 mg/dL (ref 6–20)
CHLORIDE: 85 mmol/L — AB (ref 101–111)
CO2: 27 mmol/L (ref 22–32)
Calcium: 9.5 mg/dL (ref 8.9–10.3)
Creatinine, Ser: 0.86 mg/dL (ref 0.44–1.00)
GFR calc Af Amer: >60 mL/min (ref >60)
GLUCOSE: 113 mg/dL — AB (ref 65–99)
POTASSIUM: 3.3 mmol/L — AB (ref 3.5–5.1)
Sodium: 126 mmol/L — ABNORMAL LOW (ref 135–145)

## 2015-02-22 LAB — RAPID URINE DRUG SCREEN, HOSP PERFORMED
Amphetamines: NOT DETECTED
Barbiturates: NOT DETECTED
Benzodiazepines: POSITIVE — AB
COCAINE: NOT DETECTED
OPIATES: NOT DETECTED
TETRAHYDROCANNABINOL: NOT DETECTED

## 2015-02-22 LAB — CBC
HCT: 36.2 % (ref 36.0–46.0)
HEMOGLOBIN: 12.6 g/dL (ref 12.0–15.0)
MCH: 30.4 pg (ref 26.0–34.0)
MCHC: 34.8 g/dL (ref 30.0–36.0)
MCV: 87.4 fL (ref 78.0–100.0)
Platelets: 435 10*3/uL — ABNORMAL HIGH (ref 150–400)
RBC: 4.14 MIL/uL (ref 3.87–5.11)
RDW: 12.9 % (ref 11.5–15.5)
WBC: 10.7 10*3/uL — ABNORMAL HIGH (ref 4.0–10.5)

## 2015-02-22 LAB — URINE MICROSCOPIC-ADD ON

## 2015-02-22 LAB — URINALYSIS, ROUTINE W REFLEX MICROSCOPIC
Bilirubin Urine: NEGATIVE
Glucose, UA: NEGATIVE mg/dL
Ketones, ur: NEGATIVE mg/dL
Nitrite: NEGATIVE
PH: 6 (ref 5.0–8.0)
Protein, ur: NEGATIVE mg/dL
SPECIFIC GRAVITY, URINE: 1.02 (ref 1.005–1.030)
UROBILINOGEN UA: 0.2 mg/dL (ref 0.0–1.0)

## 2015-02-22 LAB — TROPONIN I: Troponin I: 1.11 ng/mL (ref <0.031)

## 2015-02-22 LAB — CBG MONITORING, ED: GLUCOSE-CAPILLARY: 130 mg/dL — AB (ref 65–99)

## 2015-02-22 MED ORDER — LORAZEPAM 1 MG PO TABS
1.0000 mg | ORAL_TABLET | Freq: Once | ORAL | Status: AC
  Administered 2015-02-22: 1 mg via ORAL
  Filled 2015-02-22: qty 1

## 2015-02-22 MED ORDER — SODIUM CHLORIDE 0.9 % IJ SOLN: 3.0000 mL | INTRAMUSCULAR | Status: DC | PRN

## 2015-02-22 MED ORDER — NITROGLYCERIN 0.4 MG SL SUBL
0.4000 mg | SUBLINGUAL_TABLET | SUBLINGUAL | Status: DC | PRN
  Administered 2015-02-23 (×2): 0.4 mg via SUBLINGUAL
  Filled 2015-02-22: qty 1

## 2015-02-22 MED ORDER — HEPARIN SODIUM (PORCINE) 5000 UNIT/ML IJ SOLN
INTRAMUSCULAR | Status: AC
  Filled 2015-02-22: qty 1

## 2015-02-22 MED ORDER — TRAZODONE HCL 50 MG PO TABS
100.0000 mg | ORAL_TABLET | Freq: Every day | ORAL | Status: DC
  Administered 2015-02-23 – 2015-02-24 (×3): 100 mg via ORAL
  Filled 2015-02-22 (×3): qty 2

## 2015-02-22 MED ORDER — LEVOTHYROXINE SODIUM 112 MCG PO TABS
112.0000 ug | ORAL_TABLET | Freq: Every day | ORAL | Status: DC
  Administered 2015-02-23 – 2015-02-25 (×3): 112 ug via ORAL
  Filled 2015-02-22 (×3): qty 1

## 2015-02-22 MED ORDER — HYDROCHLOROTHIAZIDE 12.5 MG PO CAPS
12.5000 mg | ORAL_CAPSULE | Freq: Every day | ORAL | Status: DC
  Administered 2015-02-23 – 2015-02-25 (×3): 12.5 mg via ORAL
  Filled 2015-02-22 (×3): qty 1

## 2015-02-22 MED ORDER — SODIUM CHLORIDE 0.9 % IV BOLUS (SEPSIS)
1000.0000 mL | Freq: Once | INTRAVENOUS | Status: AC
  Administered 2015-02-22: 1000 mL via INTRAVENOUS

## 2015-02-22 MED ORDER — ONDANSETRON HCL 4 MG/2ML IJ SOLN
4.0000 mg | Freq: Three times a day (TID) | INTRAMUSCULAR | Status: DC | PRN
Start: 2015-02-22 — End: 2015-02-23

## 2015-02-22 MED ORDER — SODIUM CHLORIDE 0.9 % IV SOLN: 250.0000 mL | INTRAVENOUS | Status: DC | PRN

## 2015-02-22 MED ORDER — METOPROLOL TARTRATE 25 MG PO TABS
25.0000 mg | ORAL_TABLET | Freq: Two times a day (BID) | ORAL | Status: DC
Start: 2015-02-22 — End: 2015-02-24
  Administered 2015-02-23 – 2015-02-24 (×4): 25 mg via ORAL
  Filled 2015-02-22 (×4): qty 1

## 2015-02-22 MED ORDER — HEPARIN (PORCINE) IN NACL 100-0.45 UNIT/ML-% IJ SOLN
1050.0000 [IU]/h | INTRAMUSCULAR | Status: DC
  Administered 2015-02-22: 850 [IU]/h via INTRAVENOUS
  Filled 2015-02-22: qty 250

## 2015-02-22 MED ORDER — ATORVASTATIN CALCIUM 80 MG PO TABS
80.0000 mg | ORAL_TABLET | Freq: Every day | ORAL | Status: DC
  Administered 2015-02-23 – 2015-02-24 (×2): 80 mg via ORAL
  Filled 2015-02-22 (×2): qty 1

## 2015-02-22 MED ORDER — ASPIRIN 81 MG PO CHEW
324.0000 mg | CHEWABLE_TABLET | Freq: Once | ORAL | Status: AC
  Administered 2015-02-22: 324 mg via ORAL
  Filled 2015-02-22: qty 4

## 2015-02-22 MED ORDER — SODIUM CHLORIDE 0.9 % IV SOLN
INTRAVENOUS | Status: AC
  Administered 2015-02-23: 01:00:00 via INTRAVENOUS

## 2015-02-22 MED ORDER — DULOXETINE HCL 30 MG PO CPEP: 30.0000 mg | ORAL_CAPSULE | Freq: Every day | ORAL | Status: DC

## 2015-02-22 MED ORDER — ACETAMINOPHEN 325 MG PO TABS: 650.0000 mg | ORAL_TABLET | ORAL | Status: DC | PRN

## 2015-02-22 MED ORDER — ALPRAZOLAM 0.5 MG PO TABS
1.0000 mg | ORAL_TABLET | Freq: Four times a day (QID) | ORAL | Status: DC | PRN
  Administered 2015-02-23 (×2): 1 mg via ORAL
  Filled 2015-02-22 (×2): qty 2

## 2015-02-22 MED ORDER — DULOXETINE HCL 60 MG PO CPEP: 60.0000 mg | ORAL_CAPSULE | Freq: Every day | ORAL | Status: DC

## 2015-02-22 MED ORDER — SODIUM CHLORIDE 0.9 % IJ SOLN
3.0000 mL | Freq: Two times a day (BID) | INTRAMUSCULAR | Status: DC
  Administered 2015-02-23 – 2015-02-24 (×5): 3 mL via INTRAVENOUS

## 2015-02-22 MED ORDER — HEPARIN BOLUS VIA INFUSION
4000.0000 [IU] | Freq: Once | INTRAVENOUS | Status: AC
  Administered 2015-02-22: 4000 [IU] via INTRAVENOUS

## 2015-02-22 MED ORDER — IRBESARTAN 300 MG PO TABS
300.0000 mg | ORAL_TABLET | Freq: Every day | ORAL | Status: DC
  Administered 2015-02-23 – 2015-02-25 (×3): 300 mg via ORAL
  Filled 2015-02-22 (×3): qty 1

## 2015-02-22 MED ORDER — ASPIRIN EC 81 MG PO TBEC
81.0000 mg | DELAYED_RELEASE_TABLET | Freq: Every day | ORAL | Status: DC
  Administered 2015-02-23 – 2015-02-25 (×3): 81 mg via ORAL
  Filled 2015-02-22 (×3): qty 1

## 2015-02-22 MED ORDER — OXYCODONE HCL 5 MG PO TABS
15.0000 mg | ORAL_TABLET | ORAL | Status: DC | PRN
  Administered 2015-02-23 – 2015-02-24 (×2): 15 mg via ORAL
  Filled 2015-02-22 (×2): qty 3

## 2015-02-22 NOTE — ED Provider Notes (Signed)
CSN: [id number]     Arrival date & time 02/22/15  1859 History   First MD Initiated Contact with Patient 02/22/15 1915     Chief Complaint  Patient presents with  . Seizures     (Consider location/radiation/quality/duration/timing/severity/associated sxs/prior Treatment) HPI Comments: The patient is 59 years old, she has a comp located medical history with migraine headaches, anxiety, thyroid disease and hypertension. The patient takes multiple medications at night including 200 mg of trazodone as well as a milligram of Xanax, often 2 mg of Xanax to help with sleep and yet has not been sleeping for several nights. She reports that she was on a phone call with her sister, the next thing she remembers after she hung up is that she saw the numbers flushing on her answering machine, she felt as though the numbers were swirling around the room, she continued to say how loud that she didn't know what was going on with the numbers, her daughter was in the room states that she was there and stated that her mother was acting very erratically, seemed to be hallucinating, she then became very weak, the daughter helped her to the ground followed by witnessing tonic-clonic activity as the patient was on the ground  having stiffness of the arms and the legs with tonic-clonic activity. this lasted approximately 1-2 minutes. It resolved spontaneously after which time the patient had deep sonorous respirations, no vomiting, no urinary incontinence, no tongue biting, she gradually returned to baseline over about 15-20 minutes. This has never happened before, she denies any focal neurologic symptoms prior to this happening, she has not been having any headaches but does report that she has not been sleeping well. She does not use Xanax during the day but usually only uses it at night, last night was half of her normal dose. She denies using any alcohol, denies head injury, denies any other new medications. She denies a  history of depression, bipolar or schizophrenia though states that she does have a problem with chronic anxiety  Patient is a 59 y.o. female presenting with seizures. The history is provided by the patient.  Seizures   Past Medical History  Diagnosis Date  . Migraine   . Chest pain   . HTN (hypertension)   . SOB (shortness of breath)   . Pneumonia   . Anxiety   . Thyroid disease   . Ovarian cyst    Past Surgical History  Procedure Laterality Date  . Cholecystectomy    . Doppler echocardiography    . Leep    . Gum surgery      removed cyst   Family History  Problem Relation Age of Onset  . Hypertension Father   . Breast cancer Mother   . Migraines Sister   . Migraines Brother    Social History  Substance Use Topics  . Smoking status: Current Every Day Smoker -- 0.75 packs/day for 30 years  . Smokeless tobacco: None  . Alcohol Use: No   OB History    No data available     Review of Systems  Neurological: Positive for seizures.  All other systems reviewed and are negative.     Allergies  Actifed cold-allergy; Sudafed; and Other  Home Medications   Prior to Admission medications   Medication Sig Start Date End Date Taking? Authorizing Provider  ALPRAZolam Duanne Moron) 1 MG tablet Take 1 mg by mouth 4 (four) times daily as needed for anxiety.    Yes Historical Provider, MD  DULoxetine (CYMBALTA) 30 MG capsule Take 30 mg by mouth daily. Take 30 mg capsule along with 60 mg capsule to equal 90 mg daily.   Yes Historical Provider, MD  DULoxetine (CYMBALTA) 60 MG capsule Take 60 mg by mouth daily. Take 60 mg capsule along with a 30 mg capsule to equal 90 mg daily.   Yes Historical Provider, MD  furosemide (LASIX) 20 MG tablet Take 20 mg by mouth daily as needed for fluid.   Yes Historical Provider, MD  hydrochlorothiazide (MICROZIDE) 12.5 MG capsule Take 12.5 mg by mouth daily.  04/30/13  Yes Historical Provider, MD  ibuprofen (ADVIL,MOTRIN) 200 MG tablet Take 400-800 mg  by mouth every 6 (six) hours as needed for moderate pain.   Yes Historical Provider, MD  levothyroxine (SYNTHROID, LEVOTHROID) 112 MCG tablet Take 112 mcg by mouth daily before breakfast.   Yes Historical Provider, MD  Magnesium Salicylate (DOANS PILLS PO) Take 2 tablets by mouth daily as needed (pain).   Yes Historical Provider, MD  Multiple Vitamin (MULTIVITAMIN WITH MINERALS) TABS tablet Take 1 tablet by mouth daily.   Yes Historical Provider, MD  olmesartan (BENICAR) 40 MG tablet Take 40 mg by mouth daily.   Yes Historical Provider, MD  oxyCODONE (ROXICODONE) 15 MG immediate release tablet Take 15 mg by mouth every 4 (four) hours as needed for pain.   Yes Historical Provider, MD  traZODone (DESYREL) 100 MG tablet Take 200 mg by mouth at bedtime.   Yes Historical Provider, MD   BP 158/93 mmHg  Pulse 78  Temp(Src) 98.6 F (37 C) (Oral)  Resp 17  Ht 5\' 2"  (1.575 m)  Wt 165 lb (74.844 kg)  BMI 30.17 kg/m2  SpO2 94% Physical Exam  Constitutional: She appears well-developed and well-nourished. No distress.  HENT:  Head: Normocephalic and atraumatic.  Mouth/Throat: Oropharynx is clear and moist. No oropharyngeal exudate.  No evidence of tongue injury or dental injury  Eyes: Conjunctivae and EOM are normal. Pupils are equal, round, and reactive to light. Right eye exhibits no discharge. Left eye exhibits no discharge. No scleral icterus.  Neck: Normal range of motion. Neck supple. No JVD present. No thyromegaly present.  Cardiovascular: Normal rate, regular rhythm, normal heart sounds and intact distal pulses.  Exam reveals no gallop and no friction rub.   No murmur heard. Pulmonary/Chest: Effort normal and breath sounds normal. No respiratory distress. She has no wheezes. She has no rales.  Abdominal: Soft. Bowel sounds are normal. She exhibits no distension and no mass. There is no tenderness.  Musculoskeletal: Normal range of motion. She exhibits no edema or tenderness.  Lymphadenopathy:     She has no cervical adenopathy.  Neurological: She is alert. Coordination normal.  Speech is clear, cranial nerves III through XII are intact, memory is intact, strength is normal in all 4 extremities including grips, sensation is intact to light touch and pinprick in all 4 extremities. Coordination as tested by finger-nose-finger is normal, no limb ataxia. Normal gait, normal reflexes at the patellar tendons bilaterally  Skin: Skin is warm and dry. No rash noted. No erythema.  Psychiatric: She has a normal mood and affect. Her behavior is normal.  The patient has normal mental status, answers questions appropriately, does not appear depressed or anxious, no hallucinations  Nursing note and vitals reviewed.  ED Course  Procedures (including critical care time) Labs Review Labs Reviewed  BASIC METABOLIC PANEL - Abnormal; Notable for the following:    Sodium 126 (*)  Potassium 3.3 (*)    Chloride 85 (*)    Glucose, Bld 113 (*)    All other components within normal limits  CBC - Abnormal; Notable for the following:    WBC 10.7 (*)    Platelets 435 (*)    All other components within normal limits  URINALYSIS, ROUTINE W REFLEX MICROSCOPIC (NOT AT [facility]) - Abnormal; Notable for the following:    Hgb urine dipstick SMALL (*)    Leukocytes, UA SMALL (*)    All other components within normal limits  URINE RAPID DRUG SCREEN, HOSP PERFORMED - Abnormal; Notable for the following:    Benzodiazepines POSITIVE (*)    All other components within normal limits  TROPONIN I - Abnormal; Notable for the following:    Troponin I 1.11 (*)    All other components within normal limits  URINE MICROSCOPIC-ADD ON - Abnormal; Notable for the following:    Squamous Epithelial / LPF MANY (*)    Bacteria, UA FEW (*)    Casts HYALINE CASTS (*)    All other components within normal limits  CBG MONITORING, ED - Abnormal; Notable for the following:    Glucose-Capillary 130 (*)    All other components within  normal limits    Imaging Review Ct Head Wo Contrast  02/22/2015  CLINICAL DATA:  59 year old female acute altered mental status and possible seizure today. EXAM: CT HEAD WITHOUT CONTRAST TECHNIQUE: Contiguous axial images were obtained from the base of the skull through the vertex without intravenous contrast. COMPARISON:  10/19/2013 and prior exams FINDINGS: White matter hypodensities are unchanged. No acute intracranial abnormalities are identified, including mass lesion or mass effect, hydrocephalus, extra-axial fluid collection, midline shift, hemorrhage, or acute infarction. The visualized bony calvarium is unremarkable. IMPRESSION: No evidence of acute intracranial abnormality. Chronic white matter disease. Electronically Signed   By: Margarette Canada M.D.   On: 02/22/2015 20:22   I have personally reviewed and evaluated these images and lab results as part of my medical decision-making.   EKG Interpretation   Date/Time:  Thursday February 22 2015 19:16:15 EDT Ventricular Rate:  69 PR Interval:  154 QRS Duration: 92 QT Interval:  435 QTC Calculation: 466 R Axis:   48 Text Interpretation:  Sinus rhythm Atrial premature complex Probable  anteroseptal infarct, old ST elevation suggests acute pericarditis Since  last tracing ST elevations are new Confirmed by Emry Barbato  MD, [place] ([id number])  on 02/22/2015 7:47:16 PM      EKG Interpretation  Date/Time:  Thursday February 22 2015 21:15:22 EDT Ventricular Rate:  87 PR Interval:  157 QRS Duration: 89 QT Interval:  399 QTC Calculation: 480 R Axis:   48 Text Interpretation:  Sinus rhythm Atrial premature complex Anteroseptal infarct, old Since last tracing ST elevations no longer seen Abnormal ekg Confirmed by Haru Anspaugh  MD, Chihiro Frey ([id number]) on 02/22/2015 9:17:28 PM         MDM   Final diagnoses:  Seizure ([place])  Acute coronary syndrome ([place])    The significance of her EKG is unclear as she is not having any chest pain shortness of breath or  palpitations. She has no focal findings on exam to suggest another source, she has not been having any neurologic symptoms, her sugar is normal, check sodium, CT scan of the brain, seizure precautions  Throughout the course of the patient's stay she continues to deny any symptoms related to her chest, she states she has no chest pain, no shortness of breath, no weakness,  no symptoms in her jaw, her back, her arm. She has no nausea, she does have a headache. CT scan is negative, labs show an elevated troponin over 1  Repeated the EKG at 9:15 PM shows no ST elevations, the case was discussed with Dr. Angelena Form of the cardiology service who agrees that the patient should not be activated as a STEMI but should be transferred to [facility] to the cardiology service for further workup. She has had no further seizures in the emergency department, will give aspirin and heparin. She is not having chest pain  Medications  sodium chloride 0.9 % bolus 1,000 mL (1,000 mLs Intravenous New Bag/Given 02/22/15 1953)  LORazepam (ATIVAN) tablet 1 mg (1 mg Oral Given 02/22/15 1953)  heparin bolus via infusion 4,000 Units (4,000 Units Intravenous Given 02/22/15 2117)  aspirin chewable tablet 324 mg (324 mg Oral Given 02/22/15 2116)    CRITICAL CARE Performed by: Noemi Chapel D Total critical care time: 35 minutes Critical care time was exclusive of separately billable procedures and treating other patients. Critical care was necessary to treat or prevent imminent or life-threatening deterioration. Critical care was time spent personally by me on the following activities: development of treatment plan with patient and/or surrogate as well as nursing, discussions with consultants, evaluation of patient's response to treatment, examination of patient, obtaining history from patient or surrogate, ordering and performing treatments and interventions, ordering and review of laboratory studies, ordering and review of radiographic  studies, pulse oximetry and re-evaluation of patient's condition.   Noemi Chapel, MD 02/22/15 2123

## 2015-02-22 NOTE — Progress Notes (Signed)
ANTICOAGULATION CONSULT NOTE - Initial Consult  Pharmacy Consult for Heparin Indication: chest pain/ACS  Allergies  Allergen Reactions  . Actifed Cold-Allergy [Chlorpheniramine-Phenyleph Er] Other (See Comments)    ALTERS MENTAL STATUS  . Sudafed [Pseudoephedrine Hcl] Other (See Comments)    Climbs walls  . Other Palpitations    Topamax    Patient Measurements: Height: 5\' 2"  (157.5 cm) Weight: 168 lb 14 oz (76.6 kg) IBW/kg (Calculated) : 50.1 Heparin Dosing Weight: 67 kg  Vital Signs: Temp: 99.6 F (37.6 C) (11/03 2315) Temp Source: Oral (11/03 2315) BP: 155/99 mmHg (11/03 2315) Pulse Rate: 98 (11/03 2315)  Labs:  Recent Labs  02/22/15 2009  HGB 12.6  HCT 36.2  PLT 435*  CREATININE 0.86  TROPONINI 1.11*    Estimated Creatinine Clearance: 68.3 mL/min (by C-G formula based on Cr of 0.86).   Medical History: Past Medical History  Diagnosis Date  . Migraine   . Essential hypertension   . History of pneumonia   . Anxiety   . Hypothyroidism   . Ovarian cyst     Medications:  Prescriptions prior to admission  Medication Sig Dispense Refill Last Dose  . ALPRAZolam (XANAX) 1 MG tablet Take 1 mg by mouth 4 (four) times daily as needed for anxiety.    02/21/2015 at Unknown time  . DULoxetine (CYMBALTA) 30 MG capsule Take 30 mg by mouth daily. Take 30 mg capsule along with 60 mg capsule to equal 90 mg daily.   02/22/2015 at Unknown time  . DULoxetine (CYMBALTA) 60 MG capsule Take 60 mg by mouth daily. Take 60 mg capsule along with a 30 mg capsule to equal 90 mg daily.   02/21/2015 at Unknown time  . furosemide (LASIX) 20 MG tablet Take 20 mg by mouth daily as needed for fluid.   02/22/2015 at Unknown time  . hydrochlorothiazide (MICROZIDE) 12.5 MG capsule Take 12.5 mg by mouth daily.    02/22/2015 at Unknown time  . ibuprofen (ADVIL,MOTRIN) 200 MG tablet Take 400-800 mg by mouth every 6 (six) hours as needed for moderate pain.   unknown  . levothyroxine (SYNTHROID,  LEVOTHROID) 112 MCG tablet Take 112 mcg by mouth daily before breakfast.   02/22/2015 at Unknown time  . Magnesium Salicylate (DOANS PILLS PO) Take 2 tablets by mouth daily as needed (pain).   unknown  . Multiple Vitamin (MULTIVITAMIN WITH MINERALS) TABS tablet Take 1 tablet by mouth daily.   02/22/2015 at Unknown time  . olmesartan (BENICAR) 40 MG tablet Take 40 mg by mouth daily.   02/22/2015 at Unknown time  . oxyCODONE (ROXICODONE) 15 MG immediate release tablet Take 15 mg by mouth every 4 (four) hours as needed for pain.   02/22/2015 at Unknown time  . traZODone (DESYREL) 100 MG tablet Take 200 mg by mouth at bedtime.   02/21/2015 at Unknown time    Assessment: 59 y.o. F presents with seizures. Changes noted on EKG and troponin 1.11. To begin heparin for NSTEMI. Heparin IV bolus 4000 units given in ED ~2130. CBC ok on admission.  Goal of Therapy:  Heparin level 0.3-0.7 units/ml Monitor platelets by anticoagulation protocol: Yes   Plan:  Start heparin gtt at 850 units/hr F/u 6 hr heparin level Daily heparin level and CBC  Sherlon Handing, PharmD, BCPS Clinical pharmacist, pager [phone] 02/22/2015,11:42 PM

## 2015-02-22 NOTE — ED Notes (Signed)
Patient assisted to restroom tolerated well.

## 2015-02-22 NOTE — H&P (Signed)
Admitting cardiologist: Dr. Satira Sark Primary care provider: Dr. Redmond School  Reason for admission: NSTEMI  Clinical Summary Rita Stephens is a 59 y.o.female with past medical history outlined below, transferred from [facility] with abnormal troponin I and concern for NSTEMI. Her actual presentation is quite unusual. History obtained from the patient, her husband, and her daughter. She was apparently standing in her kitchen today around dinner time, had just gotten off the phone speaking with her sister. Her daughter states that she began to act very unusual, anxious and agitated, stated that thew number "2" that she saw the phone began to "appear all over the room." She then reportedly began to sink to the floor and started "shaking". Her daughter felt like she was having a seizure. Shaking stopped, was followed by grunting breathing which her husband witnessed. EMS was summoned. While waiting for arrival, patient apparently did wake up and became quite agitated, began to try to get up. Husband stated that she was "fighting" him quite vigorously to try and stand up, but he kept her lying down the floor. She was ultimately transported to the ER, reportedly was much more calm at that time, did complain of some "tightness" in her chest after all this agitation. No seizure activity was witnessed in the ER. Her initial ECG did show mild and not definitively diagnostic ST segment elevation, predominantly in the lateral but to some degree inferior leads concerning for injury current, troponin I level was 1.11, but with repeat tracing the ST segment changes had improved. She did have a head CT scan done that did not show any acute process. UDS was positive for benzodiazepines.  In speaking with the patient and her family members this evening, she has not had an episode quite like this before. She is not aware of any known seizure disorder. She reportedly suffers from chronic pain, and is on  medications at home including relatively high doses of Cymbalta, Xanax, oxycodone, and trazodone. In private, the patient's husband and daughter tell me that they are concerned that she overuses these medications, and that this is a relatively chronic problem.  Rita Stephens did not indicate to me any recent problems with exertional chest tightness or shortness of breath. She has no known history of cardiac disease. She does have a long-standing history of hypertension.   Allergies  Allergen Reactions  . Actifed Cold-Allergy [Chlorpheniramine-Phenyleph Er] Other (See Comments)    ALTERS MENTAL STATUS  . Sudafed [Pseudoephedrine Hcl] Other (See Comments)    Climbs walls  . Other Palpitations    Topamax    Home Medications No current facility-administered medications on file prior to encounter.   Current Outpatient Prescriptions on File Prior to Encounter  Medication Sig Dispense Refill  . ALPRAZolam (XANAX) 1 MG tablet Take 1 mg by mouth 4 (four) times daily as needed for anxiety.     . DULoxetine (CYMBALTA) 60 MG capsule Take 60 mg by mouth daily. Take 60 mg capsule along with a 30 mg capsule to equal 90 mg daily.    . hydrochlorothiazide (MICROZIDE) 12.5 MG capsule Take 12.5 mg by mouth daily.     [name] Kitchen levothyroxine (SYNTHROID, LEVOTHROID) 112 MCG tablet Take 112 mcg by mouth daily before breakfast.    . olmesartan (BENICAR) 40 MG tablet Take 40 mg by mouth daily.    Oxycodone 15 mg every 4 hours when necessary Multivitamin once daily Trazodone 200 mg daily at bedtime Lasix 20 mg as needed  Past Medical History  Diagnosis Date  . Migraine   . Essential hypertension   . History of pneumonia   . Anxiety   . Hypothyroidism   . Ovarian cyst   . Spinal stenosis   . Chronic pain     Past Surgical History  Procedure Laterality Date  . Cholecystectomy    . Doppler echocardiography    . Leep    . Gum surgery      Removed cyst    Family History  Problem Relation Age of Onset  .  Hypertension Father   . Breast cancer Mother   . Migraines Sister   . Migraines Brother     Social History Rita Stephens reports that she has been smoking Cigarettes.  She has a 22.5 pack-year smoking history. She does not have any smokeless tobacco history on file. Rita Stephens reports that she does not drink alcohol.  Review of Systems Complete review of systems negative except as otherwise outlined in the clinical summary and also the following. Reports chronic back pain, trouble with insomnia.  Physical Examination Temp:  [98.6 F (37 C)-99.6 F (37.6 C)] 99.6 F (37.6 C) (11/03 2315) Pulse Rate:  [69-98] 98 (11/03 2315) Resp:  [16-26] 18 (11/03 2315) BP: (143-167)/(69-99) 155/99 mmHg (11/03 2315) SpO2:  [91 %-99 %] 99 % (11/03 2315) Weight:  [165 lb (74.844 kg)-168 lb 14 oz (76.6 kg)] 168 lb 14 oz (76.6 kg) (11/03 2315) No intake or output data in the 24 hours ending 02/22/15 2358  Telemetry: Sinus rhythm  Chronically ill-appearing woman in no distress. HEENT: Conjunctiva and lids normal, oropharynx clear with poor dentition. Neck: Supple, no elevated JVP or carotid bruits, no thyromegaly. Lungs: Clear to auscultation, nonlabored breathing at rest. Cardiac: Regular rate and rhythm, no S3, 2/6 systolic murmur, no pericardial rub. Abdomen: Soft, nontender, bowel sounds present, no guarding or rebound. Extremities: No pitting edema, distal pulses 2+. Skin: Warm and dry. Musculoskeletal: No kyphosis. Neuropsychiatric: Alert and oriented x3, affect grossly appropriate.   Lab Results  Basic Metabolic Panel:  Recent Labs Lab 02/22/15 2009  NA 126*  K 3.3*  CL 85*  CO2 27  GLUCOSE 113*  BUN 12  CREATININE 0.86  CALCIUM 9.5    CBC:  Recent Labs Lab 02/22/15 2009  WBC 10.7*  HGB 12.6  HCT 36.2  MCV 87.4  PLT 435*    Cardiac Enzymes:  Recent Labs Lab 02/22/15 2009  TROPONINI 1.11*    Imaging Head CT 02/22/2015: FINDINGS: White matter hypodensities are  unchanged.  No acute intracranial abnormalities are identified, including mass lesion or mass effect, hydrocephalus, extra-axial fluid collection, midline shift, hemorrhage, or acute infarction.  The visualized bony calvarium is unremarkable.  IMPRESSION: No evidence of acute intracranial abnormality.  Chronic white matter disease.   Impression  1. Suspected NSTEMI, whether this represents a type I or type II event is not clear as yet. Her presentation as noted above is quite unusual. Initial ECG changes are concerning for injury current, although subsequently improved. No baseline history of cardiovascular disease, but long-standing history of hypertension and also tobacco use. She reported chest "tightness" in the ER at [facility], now has more of a pleuritic discomfort when she takes a deep breath.  2. Question seizure activity at home proceeded by change in mental status including anxiousness, agitation, and possibly visual hallucinations. She has no known history of seizure disorder. Head CT did not reveal any acute findings. She is on relatively high doses of  Xanax, oxycodone, Cymbalta, and trazodone at home. This could certainly be playing a role, and in fact she may require evaluation for detoxification.  3. Essential hypertension, on Benicar at home. Blood pressure elevated in the acute setting.  4. Reported history of spinal stenosis and chronic pain.  5. Hypothyroidism, on Synthroid.   Recommendations  Patient is admitted to the stepdown unit. She will be placed on heparin per pharmacy protocol, cycle cardiac markers to further evaluate trend and follow-up ECG in the morning. D-dimer will be added to her lab work. Initiate aspirin, high-dose statin, and beta blocker in addition to ARB. In light of her high doses of Xanax, oxycodone, and trazodone at home, these medications will need to be continued and very gradually weaned. May even need to be evaluated for possible  detoxification to avoid withdrawal. Neurology consultation should be obtained tomorrow to investigate for possible new onset seizure, although this could have been a secondary event rather than primary neurologic process. The anticipation is that she will need to have a heart catheterization eventually to clarify coronary anatomy, particularly if cardiac markers trend is typical. Echocardiogram is also being obtained to assess LVEF and focal wall motion.  Satira Sark, M.D., F.A.C.C.

## 2015-02-22 NOTE — ED Notes (Addendum)
Pt became altered at home then appeared to be having a seizure per the family. Shaking and fighting husband. EMS called. Pt calmed and mental status normalized.  CBG 150. Pt takes xanax and oxycodone at home for chronic pain.

## 2015-02-22 NOTE — ED Notes (Signed)
CRITICAL VALUE ALERT  Critical value received:  Trop 1.11  Date of notification:  02/22/15  Time of notification:  2105  Critical value read back:Yes.    Nurse who received alert:  Nicholson Starace  MD notified (1st page):  Sabra Heck  Time of first page:  21.5  MD notified (2nd page):  Time of second page:  Responding MD:  Sabra Heck  Time MD responded:  2105

## 2015-02-23 ENCOUNTER — Encounter (HOSPITAL_COMMUNITY): Payer: Self-pay | Admitting: Interventional Cardiology

## 2015-02-23 ENCOUNTER — Ambulatory Visit (HOSPITAL_COMMUNITY): Payer: Commercial Managed Care - HMO

## 2015-02-23 ENCOUNTER — Encounter (HOSPITAL_COMMUNITY)
Admission: EM | Disposition: A | Discharge status: Home / Self Care | Payer: 59 | Source: Home / Self Care | Attending: Cardiology

## 2015-02-23 DIAGNOSIS — I249 Acute ischemic heart disease, unspecified: Secondary | ICD-10-CM | POA: Insufficient documentation

## 2015-02-23 DIAGNOSIS — E785 Hyperlipidemia, unspecified: Secondary | ICD-10-CM

## 2015-02-23 DIAGNOSIS — R4182 Altered mental status, unspecified: Secondary | ICD-10-CM

## 2015-02-23 DIAGNOSIS — I213 ST elevation (STEMI) myocardial infarction of unspecified site: Secondary | ICD-10-CM

## 2015-02-23 HISTORY — PX: CARDIAC CATHETERIZATION: SHX172

## 2015-02-23 LAB — CBC
HEMATOCRIT: 34.8 % — AB (ref 36.0–46.0)
HEMOGLOBIN: 12.2 g/dL (ref 12.0–15.0)
MCH: 30.8 pg (ref 26.0–34.0)
MCHC: 35.1 g/dL (ref 30.0–36.0)
MCV: 87.9 fL (ref 78.0–100.0)
Platelets: 455 10*3/uL — ABNORMAL HIGH (ref 150–400)
RBC: 3.96 MIL/uL (ref 3.87–5.11)
RDW: 13.2 % (ref 11.5–15.5)
WBC: 16.2 10*3/uL — ABNORMAL HIGH (ref 4.0–10.5)

## 2015-02-23 LAB — LIPID PANEL
CHOL/HDL RATIO: 4.8 ratio
Cholesterol: 208 mg/dL — ABNORMAL HIGH (ref 0–200)
HDL: 43 mg/dL (ref >40)
LDL CALC: 146 mg/dL — AB (ref 0–99)
Triglycerides: 95 mg/dL (ref <150)
VLDL: 19 mg/dL (ref 0–40)

## 2015-02-23 LAB — BASIC METABOLIC PANEL
ANION GAP: 11 (ref 5–15)
ANION GAP: 9 (ref 5–15)
BUN: 6 mg/dL (ref 6–20)
BUN: 6 mg/dL (ref 6–20)
CHLORIDE: 98 mmol/L — AB (ref 101–111)
CO2: 26 mmol/L (ref 22–32)
CO2: 23 mmol/L (ref 22–32)
Calcium: 9.2 mg/dL (ref 8.9–10.3)
Calcium: 9 mg/dL (ref 8.9–10.3)
Chloride: 94 mmol/L — ABNORMAL LOW (ref 101–111)
Creatinine, Ser: 0.75 mg/dL (ref 0.44–1.00)
Creatinine, Ser: 0.72 mg/dL (ref 0.44–1.00)
GFR calc Af Amer: >60 mL/min (ref >60)
GFR calc non Af Amer: >60 mL/min (ref >60)
GFR calc non Af Amer: >60 mL/min (ref >60)
GLUCOSE: 112 mg/dL — AB (ref 65–99)
Glucose, Bld: 121 mg/dL — ABNORMAL HIGH (ref 65–99)
POTASSIUM: 3 mmol/L — AB (ref 3.5–5.1)
POTASSIUM: 3.4 mmol/L — AB (ref 3.5–5.1)
SODIUM: 130 mmol/L — AB (ref 135–145)
Sodium: 131 mmol/L — ABNORMAL LOW (ref 135–145)

## 2015-02-23 LAB — D-DIMER, QUANTITATIVE (NOT AT ARMC): D DIMER QUANT: 1.66 ug{FEU}/mL — AB (ref 0.00–0.48)

## 2015-02-23 LAB — TROPONIN I
TROPONIN I: 6.41 ng/mL — AB (ref <0.031)
Troponin I: 6.47 ng/mL (ref <0.031)
Troponin I: 4.25 ng/mL (ref <0.031)

## 2015-02-23 LAB — MRSA PCR SCREENING: MRSA by PCR: NEGATIVE

## 2015-02-23 LAB — HEPARIN LEVEL (UNFRACTIONATED): Heparin Unfractionated: 0.12 IU/mL — ABNORMAL LOW (ref 0.30–0.70)

## 2015-02-23 SURGERY — LEFT HEART CATH AND CORONARY ANGIOGRAPHY
Anesthesia: LOCAL

## 2015-02-23 MED ORDER — SODIUM CHLORIDE 0.9 % IJ SOLN: 3.0000 mL | INTRAMUSCULAR | Status: DC | PRN

## 2015-02-23 MED ORDER — MIDAZOLAM HCL 2 MG/2ML IJ SOLN
INTRAMUSCULAR | Status: AC
  Filled 2015-02-23: qty 4

## 2015-02-23 MED ORDER — SODIUM CHLORIDE 0.9 % IV SOLN
INTRAVENOUS | Status: AC
  Administered 2015-02-23: 09:00:00 via INTRAVENOUS

## 2015-02-23 MED ORDER — DULOXETINE HCL 60 MG PO CPEP
60.0000 mg | ORAL_CAPSULE | Freq: Every day | ORAL | Status: DC
  Administered 2015-02-23 – 2015-02-24 (×2): 60 mg via ORAL
  Filled 2015-02-23 (×2): qty 1

## 2015-02-23 MED ORDER — MIDAZOLAM HCL 2 MG/2ML IJ SOLN
INTRAMUSCULAR | Status: DC | PRN
  Administered 2015-02-23: 2 mg via INTRAVENOUS
  Administered 2015-02-23: 1 mg via INTRAVENOUS

## 2015-02-23 MED ORDER — SODIUM CHLORIDE 0.9 % IJ SOLN
3.0000 mL | Freq: Two times a day (BID) | INTRAMUSCULAR | Status: DC
  Administered 2015-02-23 – 2015-02-24 (×3): 3 mL via INTRAVENOUS

## 2015-02-23 MED ORDER — POTASSIUM CHLORIDE CRYS ER 10 MEQ PO TBCR
EXTENDED_RELEASE_TABLET | ORAL | Status: AC
  Administered 2015-02-23: 40 meq
  Filled 2015-02-23: qty 4

## 2015-02-23 MED ORDER — VERAPAMIL HCL 2.5 MG/ML IV SOLN
INTRAVENOUS | Status: AC
  Filled 2015-02-23: qty 2

## 2015-02-23 MED ORDER — PERFLUTREN LIPID MICROSPHERE
INTRAVENOUS | Status: AC
  Administered 2015-02-23: 3 mL
  Filled 2015-02-23: qty 10

## 2015-02-23 MED ORDER — DULOXETINE HCL 60 MG PO CPEP
90.0000 mg | ORAL_CAPSULE | Freq: Every day | ORAL | Status: DC
Start: 2015-02-23 — End: 2015-02-23

## 2015-02-23 MED ORDER — HEPARIN SODIUM (PORCINE) 1000 UNIT/ML IJ SOLN
INTRAMUSCULAR | Status: AC
  Filled 2015-02-23: qty 1

## 2015-02-23 MED ORDER — FENTANYL CITRATE (PF) 100 MCG/2ML IJ SOLN
INTRAMUSCULAR | Status: DC | PRN
  Administered 2015-02-23: 25 ug via INTRAVENOUS
  Administered 2015-02-23: 50 ug via INTRAVENOUS

## 2015-02-23 MED ORDER — VERAPAMIL HCL 2.5 MG/ML IV SOLN
INTRAVENOUS | Status: DC | PRN
  Administered 2015-02-23: 08:00:00 via INTRA_ARTERIAL

## 2015-02-23 MED ORDER — NITROGLYCERIN IN D5W 200-5 MCG/ML-% IV SOLN
2.0000 ug/min | INTRAVENOUS | Status: DC
  Administered 2015-02-23: 5 ug/min via INTRAVENOUS

## 2015-02-23 MED ORDER — ACETAMINOPHEN 325 MG PO TABS: 650.0000 mg | ORAL_TABLET | ORAL | Status: DC | PRN

## 2015-02-23 MED ORDER — ONDANSETRON HCL 4 MG/2ML IJ SOLN: 4.0000 mg | Freq: Four times a day (QID) | INTRAMUSCULAR | Status: DC | PRN

## 2015-02-23 MED ORDER — FENTANYL CITRATE (PF) 100 MCG/2ML IJ SOLN
INTRAMUSCULAR | Status: AC
  Filled 2015-02-23: qty 4

## 2015-02-23 MED ORDER — POTASSIUM CHLORIDE CRYS ER 20 MEQ PO TBCR
40.0000 meq | EXTENDED_RELEASE_TABLET | Freq: Once | ORAL | Status: AC
  Administered 2015-02-23: 40 meq via ORAL

## 2015-02-23 MED ORDER — LIDOCAINE HCL (PF) 1 % IJ SOLN
INTRAMUSCULAR | Status: AC
  Filled 2015-02-23: qty 30

## 2015-02-23 MED ORDER — SODIUM CHLORIDE 0.9 % IV SOLN: 250.0000 mL | INTRAVENOUS | Status: DC | PRN

## 2015-02-23 MED ORDER — DULOXETINE HCL 60 MG PO CPEP
60.0000 mg | ORAL_CAPSULE | Freq: Every day | ORAL | Status: DC
  Administered 2015-02-23: 60 mg via ORAL
  Filled 2015-02-23: qty 1

## 2015-02-23 MED ORDER — HEPARIN SODIUM (PORCINE) 1000 UNIT/ML IJ SOLN
INTRAMUSCULAR | Status: DC | PRN
  Administered 2015-02-23: 4000 [IU] via INTRAVENOUS

## 2015-02-23 MED ORDER — HEPARIN (PORCINE) IN NACL 2-0.9 UNIT/ML-% IJ SOLN
INTRAMUSCULAR | Status: AC
  Filled 2015-02-23: qty 1500

## 2015-02-23 MED ORDER — DULOXETINE HCL 30 MG PO CPEP
30.0000 mg | ORAL_CAPSULE | Freq: Every day | ORAL | Status: DC
  Administered 2015-02-23 – 2015-02-24 (×2): 30 mg via ORAL
  Filled 2015-02-23 (×2): qty 1

## 2015-02-23 MED ORDER — NITROGLYCERIN IN D5W 200-5 MCG/ML-% IV SOLN
INTRAVENOUS | Status: AC
  Filled 2015-02-23: qty 250

## 2015-02-23 MED ORDER — DULOXETINE HCL 30 MG PO CPEP
30.0000 mg | ORAL_CAPSULE | Freq: Every day | ORAL | Status: DC
  Administered 2015-02-23: 30 mg via ORAL
  Filled 2015-02-23: qty 1

## 2015-02-23 SURGICAL SUPPLY — 11 items

## 2015-02-23 NOTE — H&P (View-Only) (Signed)
SUBJECTIVE:  Still having CP  OBJECTIVE:   Vitals:   Filed Vitals:   02/23/15 0000 02/23/15 0109 02/23/15 0400 02/23/15 0418  BP:  154/98 151/100 151/100  Pulse:  100 94 102  Temp:    100.2 F (37.9 C)  TempSrc:    Oral  Resp: 18  20 25   Height:      Weight:    168 lb 14 oz (76.6 kg)  SpO2:   84% 100%   I&O's:   Intake/Output Summary (Last 24 hours) at 02/23/15 [id number] Last data filed at 02/23/15 [id number]  Gross per 24 hour  Intake  659.5 ml  Output    800 ml  Net -140.5 ml   TELEMETRY: Reviewed telemetry pt in NSR:     PHYSICAL EXAM General: Well developed, well nourished, in no acute distress Head: Eyes PERRLA, No xanthomas.   Normal cephalic and atramatic  Lungs:   Clear bilaterally to auscultation and percussion. Heart:   HRRR S1 S2 Pulses are 2+ & equal. Abdomen: Bowel sounds are positive, abdomen soft and non-tender without masses Extremities:   No clubbing, cyanosis or edema.  DP +1 Neuro: Alert and oriented X 3. Psych:  Good affect, responds appropriately   LABS: Basic Metabolic Panel:  Recent Labs  02/22/15 2009 02/23/15 0535  NA 126* 131*  K 3.3* 3.0*  CL 85* 94*  CO2 27 26  GLUCOSE 113* 112*  BUN 12 6  CREATININE 0.86 0.75  CALCIUM 9.5 9.2   Liver Function Tests: No results for input(s): AST, ALT, ALKPHOS, BILITOT, PROT, ALBUMIN in the last 72 hours. No results for input(s): LIPASE, AMYLASE in the last 72 hours. CBC:  Recent Labs  02/22/15 2009 02/23/15 0338  WBC 10.7* 16.2*  HGB 12.6 12.2  HCT 36.2 34.8*  MCV 87.4 87.9  PLT 435* 455*   Cardiac Enzymes:  Recent Labs  02/22/15 2009 02/23/15 0023 02/23/15 0535  TROPONINI 1.11* 6.41* 6.47*   BNP: Invalid input(s): POCBNP D-Dimer:  Recent Labs  02/23/15 0023  DDIMER 1.66*   Hemoglobin A1C: No results for input(s): HGBA1C in the last 72 hours. Fasting Lipid Panel:  Recent Labs  02/23/15 0338  CHOL 208*  HDL 43  LDLCALC 146*  TRIG 95  CHOLHDL 4.8   Thyroid  Function Tests: No results for input(s): TSH, T4TOTAL, T3FREE, THYROIDAB in the last 72 hours.  Invalid input(s): FREET3 Anemia Panel: No results for input(s): VITAMINB12, FOLATE, FERRITIN, TIBC, IRON, RETICCTPCT in the last 72 hours. Coag Panel:   No results found for: INR, PROTIME  RADIOLOGY: Ct Head Wo Contrast  02/22/2015  CLINICAL DATA:  59 year old female acute altered mental status and possible seizure today. EXAM: CT HEAD WITHOUT CONTRAST TECHNIQUE: Contiguous axial images were obtained from the base of the skull through the vertex without intravenous contrast. COMPARISON:  10/19/2013 and prior exams FINDINGS: White matter hypodensities are unchanged. No acute intracranial abnormalities are identified, including mass lesion or mass effect, hydrocephalus, extra-axial fluid collection, midline shift, hemorrhage, or acute infarction. The visualized bony calvarium is unremarkable. IMPRESSION: No evidence of acute intracranial abnormality. Chronic white matter disease. Electronically Signed   By: Margarette Canada M.D.   On: 02/22/2015 20:22    Impression  1. NSTEMI with onging CP and elevated troponin that continues to trend upward. Initial ECG changes were concerning for injury current, although subsequently improved. No baseline history of cardiovascular disease, but long-standing history of hypertension and also tobacco use. She reported chest "tightness" in the ER  at [facility].  Having more CP this am that improves with NTG but then comes back.  Trop now up to 6.47.  EKG with T wave inversions in the anterior leads and ? Minimal ST elevation in aVL.  Cardiac catheterization was discussed with the patient fully including risks on myocardial infarction, death, stroke, bleeding, arrhythmia, dye allergy, renal insufficiency or bleeding.  All patient questions and concerns were discussed and the patient understands and is willing to proceed.  Discussed case with Dr. Irish Lack. Continue ASA/high dose  statin/IV Heparin/BB. Start IV NTG gtt.   Check 2D echo.  2. Question seizure activity at home proceeded by change in mental status including anxiousness, agitation, and possibly visual hallucinations. She has no known history of seizure disorder. Head CT did not reveal any acute findings. She is on relatively high doses of Xanax, oxycodone, Cymbalta, and trazodone at home. This could certainly be playing a role, and in fact she may require evaluation for detoxification.  I am more concerned that she may have had a ventricular arrhythmia from coronary ischemia that led to the event.  3. Essential hypertension, on Benicar at home. Blood pressure elevated in the acute setting.  4. Reported history of spinal stenosis and chronic pain.  Family concerned patient may be abusing pain meds and benzos at home.    5. Hypothyroidism, on Synthroid.  6.  Hypokalemia - replete  7.  Dyslipidemia - will start high dose statin.  8.  Leukocytosis most likely related to MI     Sueanne Margarita, MD  02/23/2015  7:23 AM

## 2015-02-23 NOTE — Progress Notes (Signed)
Echocardiogram 2D Echocardiogram with Definity has been performed.  Tresa Res 02/23/2015, 1:06 PM

## 2015-02-23 NOTE — Progress Notes (Signed)
Utilization review complete. Amelita Risinger RN CCM Case Mgmt phone [phone] 

## 2015-02-23 NOTE — Progress Notes (Signed)
SUBJECTIVE:  Still having CP  OBJECTIVE:   Vitals:   Filed Vitals:   02/23/15 0000 02/23/15 0109 02/23/15 0400 02/23/15 0418  BP:  154/98 151/100 151/100  Pulse:  100 94 102  Temp:    100.2 F (37.9 C)  TempSrc:    Oral  Resp: 18  20 25   Height:      Weight:    168 lb 14 oz (76.6 kg)  SpO2:   84% 100%   I&O's:   Intake/Output Summary (Last 24 hours) at 02/23/15 [id number] Last data filed at 02/23/15 [id number]  Gross per 24 hour  Intake  659.5 ml  Output    800 ml  Net -140.5 ml   TELEMETRY: Reviewed telemetry pt in NSR:     PHYSICAL EXAM General: Well developed, well nourished, in no acute distress Head: Eyes PERRLA, No xanthomas.   Normal cephalic and atramatic  Lungs:   Clear bilaterally to auscultation and percussion. Heart:   HRRR S1 S2 Pulses are 2+ & equal. Abdomen: Bowel sounds are positive, abdomen soft and non-tender without masses Extremities:   No clubbing, cyanosis or edema.  DP +1 Neuro: Alert and oriented X 3. Psych:  Good affect, responds appropriately   LABS: Basic Metabolic Panel:  Recent Labs  02/22/15 2009 02/23/15 0535  NA 126* 131*  K 3.3* 3.0*  CL 85* 94*  CO2 27 26  GLUCOSE 113* 112*  BUN 12 6  CREATININE 0.86 0.75  CALCIUM 9.5 9.2   Liver Function Tests: No results for input(s): AST, ALT, ALKPHOS, BILITOT, PROT, ALBUMIN in the last 72 hours. No results for input(s): LIPASE, AMYLASE in the last 72 hours. CBC:  Recent Labs  02/22/15 2009 02/23/15 0338  WBC 10.7* 16.2*  HGB 12.6 12.2  HCT 36.2 34.8*  MCV 87.4 87.9  PLT 435* 455*   Cardiac Enzymes:  Recent Labs  02/22/15 2009 02/23/15 0023 02/23/15 0535  TROPONINI 1.11* 6.41* 6.47*   BNP: Invalid input(s): POCBNP D-Dimer:  Recent Labs  02/23/15 0023  DDIMER 1.66*   Hemoglobin A1C: No results for input(s): HGBA1C in the last 72 hours. Fasting Lipid Panel:  Recent Labs  02/23/15 0338  CHOL 208*  HDL 43  LDLCALC 146*  TRIG 95  CHOLHDL 4.8   Thyroid  Function Tests: No results for input(s): TSH, T4TOTAL, T3FREE, THYROIDAB in the last 72 hours.  Invalid input(s): FREET3 Anemia Panel: No results for input(s): VITAMINB12, FOLATE, FERRITIN, TIBC, IRON, RETICCTPCT in the last 72 hours. Coag Panel:   No results found for: INR, PROTIME  RADIOLOGY: Ct Head Wo Contrast  02/22/2015  CLINICAL DATA:  59 year old female acute altered mental status and possible seizure today. EXAM: CT HEAD WITHOUT CONTRAST TECHNIQUE: Contiguous axial images were obtained from the base of the skull through the vertex without intravenous contrast. COMPARISON:  10/19/2013 and prior exams FINDINGS: White matter hypodensities are unchanged. No acute intracranial abnormalities are identified, including mass lesion or mass effect, hydrocephalus, extra-axial fluid collection, midline shift, hemorrhage, or acute infarction. The visualized bony calvarium is unremarkable. IMPRESSION: No evidence of acute intracranial abnormality. Chronic white matter disease. Electronically Signed   By: Margarette Canada M.D.   On: 02/22/2015 20:22    Impression  1. NSTEMI with onging CP and elevated troponin that continues to trend upward. Initial ECG changes were concerning for injury current, although subsequently improved. No baseline history of cardiovascular disease, but long-standing history of hypertension and also tobacco use. She reported chest "tightness" in the ER  at [facility].  Having more CP this am that improves with NTG but then comes back.  Trop now up to 6.47.  EKG with T wave inversions in the anterior leads and ? Minimal ST elevation in aVL.  Cardiac catheterization was discussed with the patient fully including risks on myocardial infarction, death, stroke, bleeding, arrhythmia, dye allergy, renal insufficiency or bleeding.  All patient questions and concerns were discussed and the patient understands and is willing to proceed.  Discussed case with Dr. Irish Lack. Continue ASA/high dose  statin/IV Heparin/BB. Start IV NTG gtt.   Check 2D echo.  2. Question seizure activity at home proceeded by change in mental status including anxiousness, agitation, and possibly visual hallucinations. She has no known history of seizure disorder. Head CT did not reveal any acute findings. She is on relatively high doses of Xanax, oxycodone, Cymbalta, and trazodone at home. This could certainly be playing a role, and in fact she may require evaluation for detoxification.  I am more concerned that she may have had a ventricular arrhythmia from coronary ischemia that led to the event.  3. Essential hypertension, on Benicar at home. Blood pressure elevated in the acute setting.  4. Reported history of spinal stenosis and chronic pain.  Family concerned patient may be abusing pain meds and benzos at home.    5. Hypothyroidism, on Synthroid.  6.  Hypokalemia - replete  7.  Dyslipidemia - will start high dose statin.  8.  Leukocytosis most likely related to MI     Sueanne Margarita, MD  02/23/2015  7:23 AM

## 2015-02-23 NOTE — Progress Notes (Signed)
Paged Dr. Haroldine Laws last night at 0240 to report elevated Trop result of 6.41. And D-dimmer result of 1.66. Patient had episodes of chest pressure thru out the night and Nitro tabs SL provided some relief. Notified Romana, Agricultural consultant and she said to give Dr, Haroldine Laws time to return your call.  Dr. Haroldine Laws never returned my page. EKG was done this morning while patient was having chest pressure. See results. Dr. Radford Pax made rounds this morning and she immediately called the cath lab to fit her on the schedule. Patient transported to Cath Lab. Patient was started on a Nitro gtt and potassium 40MEQ was given PO. Patients family was notified of the cath procedure this morning and they will be here in about an hour.  Patient very anxious thru out the night and she is requesting to be put to sleep for the cath procedure. Informed Cath Lab RN of patients dependence on Oxycodone, Xanax, Trazodone and Cymbalta. Patient stayed awake all thru the night. Before giving patient Oxycodone she was trashing in the bed, getting up and down, "I need to pee, now I need to have a bowel movement." BSC placed next to bed. Patient unable to relax. Oxycodone 15mg  given and patient settled down and cooperated with staying in the bed . Report given to Day RN. Will continue to monitor closely post cath.

## 2015-02-23 NOTE — Progress Notes (Signed)
ANTICOAGULATION CONSULT NOTE - Follow Up Consult  Pharmacy Consult for Heparin Indication: chest pain/ACS  Allergies  Allergen Reactions  . Amitriptyline Itching    Allergy identified by daughter  . Actifed Cold-Allergy [Chlorpheniramine-Phenyleph Er] Other (See Comments)    ALTERS MENTAL STATUS  . Sudafed [Pseudoephedrine Hcl] Other (See Comments)    Climbs walls  . Other Palpitations    Topamax    Patient Measurements: Height: 5\' 2"  (157.5 cm) Weight: 168 lb 14 oz (76.6 kg) IBW/kg (Calculated) : 50.1 Heparin Dosing Weight: 67 kg  Vital Signs: Temp: 100.2 F (37.9 C) (11/04 0418) Temp Source: Oral (11/04 0418) BP: 151/100 mmHg (11/04 0418) Pulse Rate: 102 (11/04 0418)  Labs:  Recent Labs  02/22/15 2009 02/23/15 0023 02/23/15 0338 02/23/15 0535  HGB 12.6  --  12.2  --   HCT 36.2  --  34.8*  --   PLT 435*  --  455*  --   HEPARINUNFRC  --   --   --  0.12*  CREATININE 0.86  --   --   --   TROPONINI 1.11* 6.41*  --   --     Estimated Creatinine Clearance: 68.3 mL/min (by C-G formula based on Cr of 0.86).   Medical History: Past Medical History  Diagnosis Date  . Migraine   . Essential hypertension   . History of pneumonia   . Anxiety   . Hypothyroidism   . Ovarian cyst   . Spinal stenosis   . Chronic pain     Medications:  Prescriptions prior to admission  Medication Sig Dispense Refill Last Dose  . ALPRAZolam (XANAX) 1 MG tablet Take 1 mg by mouth 4 (four) times daily as needed for anxiety.    02/21/2015 at Unknown time  . DULoxetine (CYMBALTA) 30 MG capsule Take 30 mg by mouth daily. Take 30 mg capsule along with 60 mg capsule to equal 90 mg daily.   02/22/2015 at Unknown time  . DULoxetine (CYMBALTA) 60 MG capsule Take 60 mg by mouth daily. Take 60 mg capsule along with a 30 mg capsule to equal 90 mg daily.   02/21/2015 at Unknown time  . furosemide (LASIX) 20 MG tablet Take 20 mg by mouth daily as needed for fluid.   02/22/2015 at Unknown time  .  hydrochlorothiazide (MICROZIDE) 12.5 MG capsule Take 12.5 mg by mouth daily.    02/22/2015 at Unknown time  . ibuprofen (ADVIL,MOTRIN) 200 MG tablet Take 400-800 mg by mouth every 6 (six) hours as needed for moderate pain.   unknown  . levothyroxine (SYNTHROID, LEVOTHROID) 112 MCG tablet Take 112 mcg by mouth daily before breakfast.   02/22/2015 at Unknown time  . Magnesium Salicylate (DOANS PILLS PO) Take 2 tablets by mouth daily as needed (pain).   unknown  . Multiple Vitamin (MULTIVITAMIN WITH MINERALS) TABS tablet Take 1 tablet by mouth daily.   02/22/2015 at Unknown time  . olmesartan (BENICAR) 40 MG tablet Take 40 mg by mouth daily.   02/22/2015 at Unknown time  . oxyCODONE (ROXICODONE) 15 MG immediate release tablet Take 15 mg by mouth every 4 (four) hours as needed for pain.   02/22/2015 at Unknown time  . traZODone (DESYREL) 100 MG tablet Take 200 mg by mouth at bedtime.   02/21/2015 at Unknown time    Assessment: 59 y.o. F presents with seizures. Changes noted on EKG and troponin up to 6.41. Currently on IV heparin at 850 units/hr for NSTEMI. HL is sub-therapeutic at 0.12.  H/H wnl. Plt 455K. RN reports no s/s of bleeding   Goal of Therapy:  Heparin level 0.3-0.7 units/ml Monitor platelets by anticoagulation protocol: Yes   Plan:  Increase heparin gtt to 1050 units/hr F/u 6 hr heparin level Daily heparin level and CBC  Albertina Parr, PharmD., BCPS Clinical Pharmacist Pager [phone]

## 2015-02-23 NOTE — Interval H&P Note (Signed)
Cath Lab Visit (complete for each Cath Lab visit)  Clinical Evaluation Leading to the Procedure:   ACS: Yes.    Non-ACS:    Anginal Classification: CCS IV  Anti-ischemic medical therapy: Maximal Therapy (2 or more classes of medications)  Non-Invasive Test Results: No non-invasive testing performed  Prior CABG: No previous CABG      History and Physical Interval Note:  02/23/2015 7:53 AM  Rita Stephens  has presented today for surgery, with the diagnosis of urgent  The various methods of treatment have been discussed with the patient and family. After consideration of risks, benefits and other options for treatment, the patient has consented to  Procedure(s): Left Heart Cath and Coronary Angiography (N/A) as a surgical intervention .  The patient's history has been reviewed, patient examined, no change in status, stable for surgery.  I have reviewed the patient's chart and labs.  Questions were answered to the patient's satisfaction.     Rita Stephens S.

## 2015-02-23 NOTE — Progress Notes (Signed)
CRITICAL VALUE ALERT  Critical value received: Trop 6.41  Date of notification:  02/23/2015  Time of notification:  [id number]  Critical value read back:Yes.    Nurse who received alert:  A.Camera Krienke.RN  MD notified (1st page):  Bensimhon  Time of first page:  0240  MD notified (2nd page):  Time of second page:  Responding MD: Bensimhon  Time MD respond: [id number]

## 2015-02-24 ENCOUNTER — Inpatient Hospital Stay (HOSPITAL_COMMUNITY): Payer: Commercial Managed Care - HMO

## 2015-02-24 DIAGNOSIS — I429 Cardiomyopathy, unspecified: Secondary | ICD-10-CM

## 2015-02-24 DIAGNOSIS — D72829 Elevated white blood cell count, unspecified: Secondary | ICD-10-CM

## 2015-02-24 DIAGNOSIS — I5181 Takotsubo syndrome: Secondary | ICD-10-CM

## 2015-02-24 DIAGNOSIS — F1910 Other psychoactive substance abuse, uncomplicated: Secondary | ICD-10-CM

## 2015-02-24 LAB — CBC
HEMATOCRIT: 35.5 % — AB (ref 36.0–46.0)
HEMOGLOBIN: 12.2 g/dL (ref 12.0–15.0)
MCH: 30 pg (ref 26.0–34.0)
MCHC: 34.4 g/dL (ref 30.0–36.0)
MCV: 87.2 fL (ref 78.0–100.0)
Platelets: 451 10*3/uL — ABNORMAL HIGH (ref 150–400)
RBC: 4.07 MIL/uL (ref 3.87–5.11)
RDW: 13.2 % (ref 11.5–15.5)
WBC: 18.1 10*3/uL — ABNORMAL HIGH (ref 4.0–10.5)

## 2015-02-24 LAB — BASIC METABOLIC PANEL
ANION GAP: 9 (ref 5–15)
BUN: <5 mg/dL — ABNORMAL LOW (ref 6–20)
CHLORIDE: 93 mmol/L — AB (ref 101–111)
CO2: 25 mmol/L (ref 22–32)
CREATININE: 0.73 mg/dL (ref 0.44–1.00)
Calcium: 9.3 mg/dL (ref 8.9–10.3)
GFR calc non Af Amer: >60 mL/min (ref >60)
GLUCOSE: 129 mg/dL — AB (ref 65–99)
Potassium: 3.1 mmol/L — ABNORMAL LOW (ref 3.5–5.1)
Sodium: 127 mmol/L — ABNORMAL LOW (ref 135–145)

## 2015-02-24 LAB — TSH: TSH: 0.639 u[IU]/mL (ref 0.350–4.500)

## 2015-02-24 MED ORDER — DIVALPROEX SODIUM 500 MG PO DR TAB
500.0000 mg | DELAYED_RELEASE_TABLET | Freq: Two times a day (BID) | ORAL | Status: DC
  Administered 2015-02-24 – 2015-02-25 (×2): 500 mg via ORAL
  Filled 2015-02-24 (×3): qty 1

## 2015-02-24 MED ORDER — POTASSIUM CHLORIDE CRYS ER 20 MEQ PO TBCR
40.0000 meq | EXTENDED_RELEASE_TABLET | Freq: Two times a day (BID) | ORAL | Status: AC
  Administered 2015-02-24 (×2): 40 meq via ORAL
  Filled 2015-02-24 (×2): qty 2

## 2015-02-24 MED ORDER — CARVEDILOL 3.125 MG PO TABS
3.1250 mg | ORAL_TABLET | Freq: Two times a day (BID) | ORAL | Status: DC
  Administered 2015-02-24 – 2015-02-25 (×2): 3.125 mg via ORAL
  Filled 2015-02-24 (×2): qty 1

## 2015-02-24 MED ORDER — INFLUENZA VAC SPLIT QUAD 0.5 ML IM SUSY
0.5000 mL | PREFILLED_SYRINGE | INTRAMUSCULAR | Status: AC
  Administered 2015-02-25: 0.5 mL via INTRAMUSCULAR
  Filled 2015-02-24: qty 0.5

## 2015-02-24 NOTE — Progress Notes (Signed)
EKG CRITICAL VALUE     12 lead EKG performed.  Critical value noted.Carmell Austria, RN notified.   Yehuda Mao, CCT 02/24/2015 9:10 AM

## 2015-02-24 NOTE — Progress Notes (Signed)
CARDIAC REHAB PHASE I   PRE:  Rate/Rhythm: 92 SR    BP: sitting 142/81    SaO2:   MODE:  Ambulation: 390 ft   POST:  Rate/Rhythm: 125 ST    BP: sitting 158/90     SaO2:   Pt with increased HR with activity, higher the farther she walked. Rest x2 due to SOB. Increased HR in BR as well. BP elevated.  Ed completed with pt and family, who were very receptive. Pt apparently has decreased memory at times. She did not remember having CP the other night. Family also attests that she does not eat much and has no appetite. Discussed healthy eating habits and vaping cessation. Encouraged more walking/ex and gave gl. Will refer to [facility] at [facility]. Pt sts that she is on pain meds for her back however she also sts that it is her bra that hurts her and she does not have pain without her bra.  [id number]  Rita Stephens CES, ACSM 02/24/2015 10:56 AM

## 2015-02-24 NOTE — Progress Notes (Signed)
Pt has has reported three loose stools in the last 24 hrs. Bhagat,Bhavinkumar PA-C paged about this and he advised to send for C. Diff testing. Pt has been placed on enteric precautions and educated on importance of wearing gowns and washing hands. Will continue to monitor.

## 2015-02-24 NOTE — Progress Notes (Signed)
Patient Name: Rita Stephens Date of Encounter: 02/24/2015   SUBJECTIVE  Family at bedside. Per family, husband and daughter, witnessed seizure activity for ~14mins with confusion and agitation during initial episode. Currently feeling well. Denies chest pain, sob or palpitaions.   CURRENT MEDS . aspirin EC  81 mg Oral Daily  . atorvastatin  80 mg Oral q1800  . DULoxetine  60 mg Oral QHS   And  . DULoxetine  30 mg Oral QHS  . hydrochlorothiazide  12.5 mg Oral Daily  . [START ON 02/25/2015] Influenza vac split quadrivalent PF  0.5 mL Intramuscular Tomorrow-1000  . irbesartan  300 mg Oral Daily  . levothyroxine  112 mcg Oral QAC breakfast  . metoprolol tartrate  25 mg Oral BID  . sodium chloride  3 mL Intravenous Q12H  . sodium chloride  3 mL Intravenous Q12H  . traZODone  100 mg Oral QHS    OBJECTIVE  Filed Vitals:   02/24/15 0723 02/24/15 0800 02/24/15 0900 02/24/15 1111  BP:  151/94 142/80 139/94  Pulse:  94 97   Temp: 97.9 F (36.6 C)   98.2 F (36.8 C)  TempSrc: Oral   Oral  Resp:  20 20 18   Height:      Weight:      SpO2:  98% 98% 98%    Intake/Output Summary (Last 24 hours) at 02/24/15 1142 Last data filed at 02/24/15 0800  Gross per 24 hour  Intake    350 ml  Output   2825 ml  Net  -2475 ml   Filed Weights   02/22/15 2315 02/23/15 0418 02/24/15 0354  Weight: 168 lb 14 oz (76.6 kg) 168 lb 14 oz (76.6 kg) 168 lb 10.4 oz (76.5 kg)    PHYSICAL EXAM  General: Pleasant, NAD. Neuro: Alert and oriented X 3. Moves all extremities spontaneously. Psych: Normal affect. HEENT:  Normal  Neck: Supple without bruits or JVD. Lungs:  Resp regular and unlabored, CTA. Heart: RRR no s3, s4, or murmurs. Abdomen: Soft, non-tender, non-distended, BS + x 4.  Extremities: No clubbing, cyanosis or edema. DP/PT/Radials 2+ and equal bilaterally. Right radial without hematoma or bruit.   Accessory Clinical Findings  CBC  Recent Labs  02/23/15 0338 02/24/15 0325  WBC  16.2* 18.1*  HGB 12.2 12.2  HCT 34.8* 35.5*  MCV 87.9 87.2  PLT 455* [id number]*   Basic Metabolic Panel  Recent Labs  02/23/15 0535 02/23/15 1158  NA 131* 130*  K 3.0* 3.4*  CL 94* 98*  CO2 26 23  GLUCOSE 112* 121*  BUN 6 6  CREATININE 0.75 0.72  CALCIUM 9.2 9.0   Cardiac Enzymes  Recent Labs  02/23/15 0023 02/23/15 0535 02/23/15 1158  TROPONINI 6.41* 6.47* 4.25*   D-Dimer  Recent Labs  02/23/15 0023  DDIMER 1.66*   Fasting Lipid Panel  Recent Labs  02/23/15 0338  CHOL 208*  HDL 43  LDLCALC 146*  TRIG 95  CHOLHDL 4.8   Thyroid Function Tests  Recent Labs  02/24/15 0325  TSH 0.639    Radiology/Studies  Ct Head Wo Contrast  02/22/2015  CLINICAL DATA:  59 year old female acute altered mental status and possible seizure today. EXAM: CT HEAD WITHOUT CONTRAST TECHNIQUE: Contiguous axial images were obtained from the base of the skull through the vertex without intravenous contrast. COMPARISON:  10/19/2013 and prior exams FINDINGS: White matter hypodensities are unchanged. No acute intracranial abnormalities are identified, including mass lesion or mass effect, hydrocephalus, extra-axial fluid collection,  midline shift, hemorrhage, or acute infarction. The visualized bony calvarium is unremarkable. IMPRESSION: No evidence of acute intracranial abnormality. Chronic white matter disease. Electronically Signed   By: Margarette Canada M.D.   On: 02/22/2015 20:22   Left Heart Cath and Coronary Angiography 02/24/15    Conclusion     There is severe left ventricular systolic dysfunction in a pattern of Takatsubo cardiomyopathy.  No significant CAD.  Medical therapy for her Takatsubo cardiomyopathy, which should improve within a few weeks.  Cardiology f/u with Dr. Domenic Polite.   Echo 02/23/15 LV EF: 40% -  45%  ------------------------------------------------------------------- Indications:   MI - acute  410.91.  ------------------------------------------------------------------- History:  Risk factors: Current tobacco use. Hypertension.  ------------------------------------------------------------------- Study Conclusions  - Left ventricle: The cavity size was normal. Wall thickness was normal. Systolic function was mildly to moderately reduced. The estimated ejection fraction was in the range of 40% to 45%. Doppler parameters are consistent with high ventricular filling pressure. - Mitral valve: There was mild regurgitation. - Left atrium: The atrium was moderately dilated. - Tricuspid valve: There was moderate regurgitation. - Pulmonary arteries: PA peak pressure: 72 mm Hg (S).  Impressions:  - Apical wall motion abnormalities suggestive of Takotsubo stress cardiomyopathy.  ASSESSMENT AND PLAN    1. NSTEMI with onging CP and elevated troponin - now trending dowen. Initial ECG changes were concerning for injury current, although subsequently improved. No baseline history of cardiovascular disease, but long-standing history of hypertension and also tobacco use. She reported chest "tightness" in the ER at [facility].  - LHC showed severe left ventricular systolic dysfunction in a pattern of Takatsubo cardiomyopathy. - Echo showed LV EF of 40-45%, mild LVH, moderate dilated LA, PA peak pressure: 72 mm Hg (S). Apical wall motion abnormalities suggestive of Takotsubo stress cardiomyopathy.  2. Question seizure activity at home proceeded by change in mental status including anxiousness, agitation, and possibly visual hallucinations. She has no known history of seizure disorder. Head CT did not reveal any acute findings. She is on relatively high doses of Xanax, oxycodone, Cymbalta, and trazodone at home. This could certainly be playing a role, and in fact she may require evaluation for detoxification.UDS positive for benzo. Family stated today that patient has seizure activity  prior to initial episode.   3. Essential hypertension, on Benicar at home. Blood pressure elevated in the acute setting. Last reading 137/94.   4. Reported history of spinal stenosis and chronic pain. Family concerned patient may be abusing pain meds and benzos at home.   5. Hypothyroidism - Continue Synthroid.  6. Hypokalemia - replete. K of 3.4 today.   7. Dyslipidemia - LDL of 146. Continue lipitor 80mg  for now.   8. Leukocytosis. WBC increased to 18.1 today from 16.2.  Dispo: IM consult for elevated WBC, possible drug overdose/abuse and possible seizure.   Signed, Bhagat,Bhavinkumar PA-C Pager [phone]  The patient was seen and examined, and I agree with the assessment and plan as documented above, with modifications as noted below.  Denies chest pain and shortness of breath. Denies fevers. Has chronic migraines. Has some sinus congestion. Denies burning with micturition but does take a while to initiate a stream.  Will switch metoprolol tartrate to Coreg given Takotsubo cardiomyopathy and elevated BP. Already on ARB. Will check BMET today as K value is from 11/4. Consult neurology given concern for possible seizure. This may in be related to drug abuse/overdose. Leukocytosis with no clear evidence for infection. UA unremarkable 2 days ago. Will  monitor.  Kate Sable, MD, [facility]  02/24/2015 12:08 PM

## 2015-02-24 NOTE — Consult Note (Signed)
Reason for Consult:Seizures Referring Physician: Dr Bronson Ing  CC: Seizure ( No prior seizure history)  HPI: Rita Stephens is a 59 y.o. female with a history of hypertension, spinal stenosis with chronic pain, migraine headaches, tobacco use, hypothyroidism, hypokalemia (3.3), dyslipidemia, and leukocytosis admitted to [facility] on 02/23/2015 by Dr. Radford Pax with chest pain, elevated troponin enzymes, and a non-ST elevation MI. There was a question of seizure activity just prior to admission which included mental status changes, anxiety, agitation, and possible visual hallucinations. The patient has no previous history of a seizure disorder; although, she has been on Xanax, oxycodone, Cymbalta, and trazodone prior to admission and her family was concerned that the patient may have been abusing these medications. Cardiology was also concerned that she may have had a ventricular arrhythmia secondary to coronary ischemia which may have contributed to the events. A CT scan of the head was unremarkable. An EEG has not been performed.  The patient states that she was in her kitchen with her daughter when this event occurred. The patient was speaking on the telephone when she suddenly began to stare off in the distance with a frightened look on her face. She said she saw a #2 everywhere. She asked her daughter if she saw the same thing but the daughter did not. Her daughters voice sounded very far away. The patient then slumped to the floor. She just remembers waking up with EMS there. She initially did not recognize her husband but slowly came around. The patient was initially agitated and combative. She does not know what happened when she was out. The patient's husband and daughter were not in the room at the time of the interview and I was unable to reach them. She states that she has no history of seizures and this has never happened before or since. She does have a history of migraines and has  recently experienced some migraine headaches. She has been taking her medications as prescribed although she had missed several doses of Xanax recently.   Past Medical History  Diagnosis Date  . Migraine   . Essential hypertension   . History of pneumonia   . Anxiety   . Hypothyroidism   . Ovarian cyst   . Spinal stenosis   . Chronic pain     Past Surgical History  Procedure Laterality Date  . Cholecystectomy    . Doppler echocardiography    . Leep    . Gum surgery      Removed cyst  . Cardiac catheterization N/A 02/23/2015    Procedure: Left Heart Cath and Coronary Angiography;  Surgeon: Jettie Booze, MD;  Location: [facility];  Service: Cardiovascular;  Laterality: N/A;    Family History  Problem Relation Age of Onset  . Hypertension Father   . Breast cancer Mother   . Migraines Sister   . Migraines Brother    The patient's mother had seizures after she developed cancer.  Social History:  reports that she has been smoking Cigarettes.  She has a 22.5 pack-year smoking history. She does not have any smokeless tobacco history on file. She reports that she does not drink alcohol or use illicit drugs.  Allergies  Allergen Reactions  . Amitriptyline Itching    Allergy identified by daughter  . Actifed Cold-Allergy [Chlorpheniramine-Phenyleph Er] Other (See Comments)    ALTERS MENTAL STATUS  . Sudafed [Pseudoephedrine Hcl] Other (See Comments)    Climbs walls  . Other Palpitations    Topamax  Medications:  Scheduled: . aspirin EC  81 mg Oral Daily  . atorvastatin  80 mg Oral q1800  . carvedilol  3.125 mg Oral BID WC  . DULoxetine  60 mg Oral QHS   And  . DULoxetine  30 mg Oral QHS  . hydrochlorothiazide  12.5 mg Oral Daily  . [START ON 02/25/2015] Influenza vac split quadrivalent PF  0.5 mL Intramuscular Tomorrow-1000  . irbesartan  300 mg Oral Daily  . levothyroxine  112 mcg Oral QAC breakfast  . potassium chloride  40 mEq Oral BID  . sodium  chloride  3 mL Intravenous Q12H  . sodium chloride  3 mL Intravenous Q12H  . traZODone  100 mg Oral QHS    ROS: History obtained from the patient  General ROS: negative for - chills, fatigue, fever, night sweats, weight gain or weight loss Psychological ROS: negative for - behavioral disorder, hallucinations, memory difficulties, mood swings or suicidal ideation. The patient admits to being sometimes slow to think, speak, and process.[name] Kitchen Ophthalmic ROS: negative for - blurry vision, double vision, eye pain or loss of vision ENT ROS: negative for - epistaxis, nasal discharge, oral lesions, sore throat, tinnitus or vertigo Allergy and Immunology ROS: negative for - hives or itchy/watery eyes Hematological and Lymphatic ROS: negative for - bleeding problems, bruising or swollen lymph nodes Endocrine ROS: negative for - galactorrhea, hair pattern changes, polydipsia/polyuria or temperature intolerance Respiratory ROS: negative for - cough, hemoptysis, shortness of breath or wheezing Cardiovascular ROS: negative for - chest pain, dyspnea on exertion, edema or irregular heartbeat Gastrointestinal ROS: negative for - abdominal pain, hematemesis, nausea/vomiting or stool incontinence. Positive for diarrhea which started yesterday. The patient is on isolation for possible C. Difficile. Genito-Urinary ROS: negative for - dysuria, hematuria, incontinence or urinary frequency/urgency Musculoskeletal ROS: negative for - joint swelling or muscular weakness. Positive for chronic back pain. Neurological ROS: as noted in HPI Dermatological ROS: negative for rash and skin lesion changes   Physical Examination: Blood pressure 139/96, pulse 97, temperature 98.2 F (36.8 C), temperature source Oral, resp. rate 18, height 5\' 2"  (1.575 m), weight 76.5 kg (168 lb 10.4 oz), SpO2 98 %.   General - pleasant 59 year old female in no acute distress Heart - Regular rate and rhythm -2/6 systolic murmur Lungs - Clear  to auscultation Abdomen - Soft - non tender Extremities - Distal pulses intact - no edema Skin - Warm and dry   Neurologic Examination Mental Status: Alert, oriented, thought content appropriate.  Speech fluent without evidence of aphasia although mildly slow to respond.  Able to follow 3 step commands without difficulty. Cranial Nerves: II: Discs not visualized; Visual fields grossly normal, pupils equal, round, reactive to light and accommodation III,IV, VI: ptosis not present, extra-ocular motions intact bilaterally V,VII: smile symmetric, facial light touch sensation normal bilaterally VIII: hearing normal bilaterally IX,X: gag reflex present XI: bilateral shoulder shrug XII: midline tongue extension Motor: Right : Upper extremity   5/5    Left:     Upper extremity   5/5  Lower extremity   5/5     Lower extremity   5/5 Tone and bulk:normal tone throughout; no atrophy noted Sensory: Light touch intact throughout, bilaterally Deep Tendon Reflexes: 2+ and symmetric throughout Plantars: Right: Equivocal   Left: downgoing Cerebellar: normal finger-to-nose, normal rapid alternating movements and normal heel-to-shin test Gait: Deferred CV: pulses palpable throughout    Laboratory Studies:   Basic Metabolic Panel:  Recent Labs Lab 02/22/15 2009 02/23/15 0535  02/23/15 1158 02/24/15 1254  NA 126* 131* 130* 127*  K 3.3* 3.0* 3.4* 3.1*  CL 85* 94* 98* 93*  CO2 27 26 23 25   GLUCOSE 113* 112* 121* 129*  BUN 12 6 6  <5*  CREATININE 0.86 0.75 0.72 0.73  CALCIUM 9.5 9.2 9.0 9.3    Liver Function Tests: No results for input(s): AST, ALT, ALKPHOS, BILITOT, PROT, ALBUMIN in the last 168 hours. No results for input(s): LIPASE, AMYLASE in the last 168 hours. No results for input(s): AMMONIA in the last 168 hours.  CBC:  Recent Labs Lab 02/22/15 2009 02/23/15 0338 02/24/15 0325  WBC 10.7* 16.2* 18.1*  HGB 12.6 12.2 12.2  HCT 36.2 34.8* 35.5*  MCV 87.4 87.9 87.2  PLT 435*  455* 451*    Cardiac Enzymes:  Recent Labs Lab 02/22/15 2009 02/23/15 0023 02/23/15 0535 02/23/15 1158  TROPONINI 1.11* 6.41* 6.47* 4.25*    BNP: Invalid input(s): POCBNP  CBG:  Recent Labs Lab 02/22/15 1925  GLUCAP 130*    Microbiology: Results for orders placed or performed during the hospital encounter of 02/22/15  MRSA PCR Screening     Status: None   Collection Time: 02/23/15  1:03 AM  Result Value Ref Range Status   MRSA by PCR NEGATIVE NEGATIVE Final    Comment:        The GeneXpert MRSA Assay (FDA approved for NASAL specimens only), is one component of a comprehensive MRSA colonization surveillance program. It is not intended to diagnose MRSA infection nor to guide or monitor treatment for MRSA infections.     Coagulation Studies: No results for input(s): LABPROT, INR in the last 72 hours.  Urinalysis:  Recent Labs Lab 02/22/15 2000  COLORURINE YELLOW  LABSPEC 1.020  PHURINE 6.0  GLUCOSEU NEGATIVE  HGBUR SMALL*  BILIRUBINUR NEGATIVE  KETONESUR NEGATIVE  PROTEINUR NEGATIVE  UROBILINOGEN 0.2  NITRITE NEGATIVE  LEUKOCYTESUR SMALL*    Lipid Panel:     Component Value Date/Time   CHOL 208* 02/23/2015 0338   TRIG 95 02/23/2015 0338   HDL 43 02/23/2015 0338   CHOLHDL 4.8 02/23/2015 0338   VLDL 19 02/23/2015 0338   LDLCALC 146* 02/23/2015 0338    HgbA1C:  Lab Results  Component Value Date   HGBA1C 5.7 05/04/2013    Urine Drug Screen:     Component Value Date/Time   LABOPIA NONE DETECTED 02/22/2015 2000   COCAINSCRNUR NONE DETECTED 02/22/2015 2000   LABBENZ POSITIVE* 02/22/2015 2000   AMPHETMU NONE DETECTED 02/22/2015 2000   THCU NONE DETECTED 02/22/2015 2000   LABBARB NONE DETECTED 02/22/2015 2000    Alcohol Level: No results for input(s): ETH in the last 168 hours.  Other results: EKG: - Sinus rhythm rate 69 bpm. Please see the formal cardiology reading for complete details.  Imaging   Ct Head Wo Contrast 02/22/2015    No evidence of acute intracranial abnormality. Chronic white matter disease.     Assessment/Plan: 59 year old female who developed what appeared to be possible seizure activity prior to admission with a non-ST elevation MI. No previous seizure history. No episodes since that occurrence.   Dr. Leonel Ramsay to see the patient for further evaluation and assessment/plan.  Mikey Bussing PA-C [facility] Pager [phone] 02/24/2015, 3:07 PM   I have seen and evaluated the patient. I have reviewed the above note.  I was able to get more info from the daughter who is clearly able to describe figure of four posturing(right arm flexed, left extended)  with post-ictal state. 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   She does have some intermittent episodes of confusion which could be medication related, though I do wonder about partial seizures.   I discussed with her prognosis of new onset seizures and 50 - 60% lifetime recurrence rate. After discussion it is clear that she would prefer to have treatment and I agree that this is reasonable.   She has a history of migraines, but did not tolerate topamax. She gets weekly headaches so I feel that starting a medicine targeting both would be prudent.   1) Depakote 500mg  BID.  2) MRI brain given focal seizure 3) will need to have EEG, but this can be done as outpatient given that we are starting meds either way.  4) I will request follow up as outpatient.   Roland Rack, MD [facility] [phone]  If 7pm- 7am, please page neurology on call as listed in [place].

## 2015-02-25 DIAGNOSIS — R197 Diarrhea, unspecified: Secondary | ICD-10-CM | POA: Insufficient documentation

## 2015-02-25 DIAGNOSIS — I5181 Takotsubo syndrome: Secondary | ICD-10-CM | POA: Insufficient documentation

## 2015-02-25 DIAGNOSIS — R569 Unspecified convulsions: Secondary | ICD-10-CM | POA: Insufficient documentation

## 2015-02-25 DIAGNOSIS — I249 Acute ischemic heart disease, unspecified: Secondary | ICD-10-CM

## 2015-02-25 DIAGNOSIS — I10 Essential (primary) hypertension: Secondary | ICD-10-CM | POA: Insufficient documentation

## 2015-02-25 DIAGNOSIS — G40209 Localization-related (focal) (partial) symptomatic epilepsy and epileptic syndromes with complex partial seizures, not intractable, without status epilepticus: Secondary | ICD-10-CM

## 2015-02-25 DIAGNOSIS — D72829 Elevated white blood cell count, unspecified: Secondary | ICD-10-CM | POA: Insufficient documentation

## 2015-02-25 DIAGNOSIS — E876 Hypokalemia: Secondary | ICD-10-CM | POA: Insufficient documentation

## 2015-02-25 LAB — BASIC METABOLIC PANEL
ANION GAP: 9 (ref 5–15)
CALCIUM: 9.2 mg/dL (ref 8.9–10.3)
CO2: 25 mmol/L (ref 22–32)
CREATININE: 0.65 mg/dL (ref 0.44–1.00)
Chloride: 94 mmol/L — ABNORMAL LOW (ref 101–111)
GFR calc Af Amer: >60 mL/min (ref >60)
GLUCOSE: 119 mg/dL — AB (ref 65–99)
Potassium: 3.1 mmol/L — ABNORMAL LOW (ref 3.5–5.1)
Sodium: 128 mmol/L — ABNORMAL LOW (ref 135–145)

## 2015-02-25 LAB — CBC
HEMATOCRIT: 35.3 % — AB (ref 36.0–46.0)
Hemoglobin: 12.1 g/dL (ref 12.0–15.0)
MCH: 29.7 pg (ref 26.0–34.0)
MCHC: 34.3 g/dL (ref 30.0–36.0)
MCV: 86.7 fL (ref 78.0–100.0)
PLATELETS: 423 10*3/uL — AB (ref 150–400)
RBC: 4.07 MIL/uL (ref 3.87–5.11)
RDW: 13.3 % (ref 11.5–15.5)
WBC: 15.1 10*3/uL — AB (ref 4.0–10.5)

## 2015-02-25 LAB — C DIFFICILE QUICK SCREEN W PCR REFLEX
C DIFFICILE (CDIFF) INTERP: NEGATIVE
C DIFFICLE (CDIFF) ANTIGEN: NEGATIVE
C Diff toxin: NEGATIVE

## 2015-02-25 MED ORDER — CARVEDILOL 6.25 MG PO TABS: 6.2500 mg | ORAL_TABLET | Freq: Two times a day (BID) | ORAL | Status: DC

## 2015-02-25 MED ORDER — POTASSIUM CHLORIDE CRYS ER 20 MEQ PO TBCR
40.0000 meq | EXTENDED_RELEASE_TABLET | Freq: Once | ORAL | Status: AC
  Administered 2015-02-25: 40 meq via ORAL
  Filled 2015-02-25: qty 2

## 2015-02-25 MED ORDER — ATORVASTATIN CALCIUM 40 MG PO TABS: 40.0000 mg | ORAL_TABLET | Freq: Every day | ORAL | Status: DC

## 2015-02-25 MED ORDER — ASPIRIN 81 MG PO TBEC: 81.0000 mg | DELAYED_RELEASE_TABLET | Freq: Every day | ORAL | Status: AC

## 2015-02-25 MED ORDER — DIVALPROEX SODIUM 500 MG PO DR TAB: 500.0000 mg | DELAYED_RELEASE_TABLET | Freq: Two times a day (BID) | ORAL | Status: DC

## 2015-02-25 NOTE — Progress Notes (Addendum)
SUBJECTIVE: Denies chest pain and shortness of breath. Had 3 loose stools, C diff to be tested. Eager to go home.     Intake/Output Summary (Last 24 hours) at 02/25/15 1007 Last data filed at 02/24/15 2100  Gross per 24 hour  Intake    590 ml  Output    200 ml  Net    390 ml    Current Facility-Administered Medications  Medication Dose Route Frequency Provider Last Rate Last Dose  . 0.9 %  sodium chloride infusion  250 mL Intravenous PRN Satira Sark, MD      . 0.9 %  sodium chloride infusion  250 mL Intravenous PRN Jettie Booze, MD      . acetaminophen (TYLENOL) tablet 650 mg  650 mg Oral Q4H PRN Satira Sark, MD      . acetaminophen (TYLENOL) tablet 650 mg  650 mg Oral Q4H PRN Jettie Booze, MD      . ALPRAZolam Duanne Moron) tablet 1 mg  1 mg Oral QID PRN Satira Sark, MD   1 mg at 02/23/15 2058  . aspirin EC tablet 81 mg  81 mg Oral Daily Satira Sark, MD   81 mg at 02/24/15 0940  . atorvastatin (LIPITOR) tablet 80 mg  80 mg Oral q1800 Satira Sark, MD   80 mg at 02/24/15 1710  . carvedilol (COREG) tablet 6.25 mg  6.25 mg Oral BID WC Herminio Commons, MD      . divalproex (DEPAKOTE) DR tablet 500 mg  500 mg Oral Q12H Greta Doom, MD   500 mg at 02/24/15 2108  . DULoxetine (CYMBALTA) DR capsule 60 mg  60 mg Oral QHS Satira Sark, MD   60 mg at 02/24/15 2108   And  . DULoxetine (CYMBALTA) DR capsule 30 mg  30 mg Oral QHS Satira Sark, MD   30 mg at 02/24/15 2108  . hydrochlorothiazide (MICROZIDE) capsule 12.5 mg  12.5 mg Oral Daily Satira Sark, MD   12.5 mg at 02/24/15 0940  . Influenza vac split quadrivalent PF (FLUARIX) injection 0.5 mL  0.5 mL Intramuscular Tomorrow-1000 Sueanne Margarita, MD      . irbesartan (AVAPRO) tablet 300 mg  300 mg Oral Daily Satira Sark, MD   300 mg at 02/24/15 0940  . levothyroxine (SYNTHROID, LEVOTHROID) tablet 112 mcg  112 mcg Oral QAC breakfast Satira Sark, MD   112 mcg  at 02/25/15 0801  . nitroGLYCERIN (NITROSTAT) SL tablet 0.4 mg  0.4 mg Sublingual Q5 Min x 3 PRN Satira Sark, MD   0.4 mg at 02/23/15 0425  . ondansetron (ZOFRAN) injection 4 mg  4 mg Intravenous Q6H PRN Jettie Booze, MD      . oxyCODONE (Oxy IR/ROXICODONE) immediate release tablet 15 mg  15 mg Oral Q4H PRN Satira Sark, MD   15 mg at 02/24/15 0156  . sodium chloride 0.9 % injection 3 mL  3 mL Intravenous Q12H Satira Sark, MD   3 mL at 02/24/15 2200  . sodium chloride 0.9 % injection 3 mL  3 mL Intravenous PRN Satira Sark, MD      . sodium chloride 0.9 % injection 3 mL  3 mL Intravenous Q12H Jettie Booze, MD   3 mL at 02/24/15 2200  . sodium chloride 0.9 % injection 3 mL  3 mL Intravenous PRN Jettie Booze, MD      .  traZODone (DESYREL) tablet 100 mg  100 mg Oral QHS Satira Sark, MD   100 mg at 02/24/15 2108    Filed Vitals:   02/25/15 0000 02/25/15 0358 02/25/15 0730 02/25/15 0801  BP: 132/85 154/97 154/100 159/100  Pulse:  105  105  Temp:  98.2 F (36.8 C) 98.2 F (36.8 C)   TempSrc:  Oral Oral   Resp:  16 16   Height:      Weight:  164 lb 14.5 oz (74.8 kg)    SpO2:  98% 97%     PHYSICAL EXAM General: NAD HEENT: Normal. Neck: No JVD, no thyromegaly.  Lungs: Clear to auscultation bilaterally with normal respiratory effort. CV: Nondisplaced PMI.  Regular rate and rhythm, normal S1/S2, no S3/S4, soft 1/6 pansystolic murmur along LSB.  No pretibial edema.    Abdomen: Soft, nontender, no hepatosplenomegaly, no distention.  Neurologic: Alert and oriented x 3.  Psych: Normal affect. Musculoskeletal: No gross deformities. Extremities: No clubbing or cyanosis.   TELEMETRY: Reviewed telemetry pt in sinus rhythm/sinus tach.  LABS: Basic Metabolic Panel:  Recent Labs  02/24/15 1254 02/25/15 0115  NA 127* 128*  K 3.1* 3.1*  CL 93* 94*  CO2 25 25  GLUCOSE 129* 119*  BUN <5* <5*  CREATININE 0.73 0.65  CALCIUM 9.3 9.2   Liver  Function Tests: No results for input(s): AST, ALT, ALKPHOS, BILITOT, PROT, ALBUMIN in the last 72 hours. No results for input(s): LIPASE, AMYLASE in the last 72 hours. CBC:  Recent Labs  02/24/15 0325 02/25/15 0115  WBC 18.1* 15.1*  HGB 12.2 12.1  HCT 35.5* 35.3*  MCV 87.2 86.7  PLT 451* 423*   Cardiac Enzymes:  Recent Labs  02/23/15 0023 02/23/15 0535 02/23/15 1158  TROPONINI 6.41* 6.47* 4.25*   BNP: Invalid input(s): POCBNP D-Dimer:  Recent Labs  02/23/15 0023  DDIMER 1.66*   Hemoglobin A1C: No results for input(s): HGBA1C in the last 72 hours. Fasting Lipid Panel:  Recent Labs  02/23/15 0338  CHOL 208*  HDL 43  LDLCALC 146*  TRIG 95  CHOLHDL 4.8   Thyroid Function Tests:  Recent Labs  02/24/15 0325  TSH 0.639   Anemia Panel: No results for input(s): VITAMINB12, FOLATE, FERRITIN, TIBC, IRON, RETICCTPCT in the last 72 hours.  RADIOLOGY: Ct Head Wo Contrast  02/22/2015  CLINICAL DATA:  59 year old female acute altered mental status and possible seizure today. EXAM: CT HEAD WITHOUT CONTRAST TECHNIQUE: Contiguous axial images were obtained from the base of the skull through the vertex without intravenous contrast. COMPARISON:  10/19/2013 and prior exams FINDINGS: White matter hypodensities are unchanged. No acute intracranial abnormalities are identified, including mass lesion or mass effect, hydrocephalus, extra-axial fluid collection, midline shift, hemorrhage, or acute infarction. The visualized bony calvarium is unremarkable. IMPRESSION: No evidence of acute intracranial abnormality. Chronic white matter disease. Electronically Signed   By: Margarette Canada M.D.   On: 02/22/2015 20:22   Mr Brain Wo Contrast  02/24/2015  CLINICAL DATA:  59 year old female with altered mental status, possible seizure. Initial encounter. EXAM: MRI HEAD WITHOUT CONTRAST TECHNIQUE: Multiplanar, multiecho pulse sequences of the brain and surrounding structures were obtained without  intravenous contrast. COMPARISON:  Head CT without contrast 02/22/2015 and earlier. Brain MRI 07/03/2008. FINDINGS: Cerebral volume is not significantly changed since 2010. Major intracranial vascular flow voids are stable and within normal limits. No restricted diffusion to suggest acute infarction. No midline shift, mass effect, evidence of mass lesion, ventriculomegaly, extra-axial collection or acute  intracranial hemorrhage. Cervicomedullary junction and pituitary are within normal limits. Scattered and patchy cerebral white matter T2 and FLAIR hyperintensity is chronic and has mildly progressed since 2010. As before, the pattern is nonspecific. No cortical encephalomalacia. No definite chronic cerebral blood products deep gray matter nuclei, brainstem, and cerebellum are within normal limits. Mesial temporal lobe structures including the hippocampal formations appear within normal limits. Stable visualized internal auditory structures. Mastoids are clear. Mild ethmoid sinus mucosal thickening. Negative orbit and scalp soft tissues. Negative visualized cervical spine. Visualized bone marrow signal is within normal limits. IMPRESSION: 1.  No acute intracranial abnormality. 2. Mildly progressed chronic nonspecific cerebral white matter signal changes since 2010. Electronically Signed   By: Genevie Ann M.D.   On: 02/24/2015 19:54      ASSESSMENT AND PLAN: 1. Takotsubo cardiomyopathy, EF 40-45%: No evidence of heart failure. Will increase Coreg given elevated BP. Already on ARB.  2. Seizure: Evaluated by neurology and started on Depakote. MRI without focal abnormalities.  3. Diarrhea: C Diff to be checked.  4. Essential HTN: Increase Coreg.  5. Leukocytosis: Trending down today. C Diff?  6. Hypokalemia: Will replete.  7. Hyperlipidemia: On Lipitor.   Kate Sable, M.D., F.A.C.C.  ADDENDUM: C diff negative. HR 88 bpm. Pt stable. Can d/c to home. Follow up with Dr. Domenic Polite.

## 2015-02-25 NOTE — Discharge Summary (Signed)
Discharge Summary   Patient ID: Rita Stephens,  MRN: [id number], DOB/AGE: 12-22-1955 59 y.o.  Admit date: 02/22/2015 Discharge date: 02/25/2015  Primary Care Provider: Glo Stephens Primary Cardiologist: Rita Stephens  Discharge Diagnoses Active Problems:   NSTEMI (non-ST elevated myocardial infarction) Rita Stephens)   Acute coronary syndrome ([place])   Seizure (Rita Stephens)   Takotsubo syndrome   Essential hypertension   Diarrhea   Hypokalemia   Leukocytosis  Reported history of spinal stenosis and chronic pain   Allergies Allergies  Allergen Reactions  . Amitriptyline Itching    Allergy identified by daughter  . Actifed Cold-Allergy [Chlorpheniramine-Phenyleph Er] Other (See Comments)    ALTERS MENTAL STATUS  . Sudafed [Pseudoephedrine Hcl] Other (See Comments)    Climbs walls  . Other Palpitations    Topamax    Consultant: Neurology  Procedures:   Left Heart Cath and Coronary Angiography 02/24/15    Conclusion     There is severe left ventricular systolic dysfunction in a pattern of Takatsubo cardiomyopathy.  No significant CAD.  Medical therapy for her Takatsubo cardiomyopathy, which should improve within a few weeks.  Cardiology f/u with Rita Stephens.   Echo 02/23/15 LV EF: 40% -  45%  ------------------------------------------------------------------- Indications:   MI - acute 410.91.  ------------------------------------------------------------------- History:  Risk factors: Current tobacco use. Hypertension.  ------------------------------------------------------------------- Study Conclusions  - Left ventricle: The cavity size was normal. Wall thickness was normal. Systolic function was mildly to moderately reduced. The estimated ejection fraction was in the range of 40% to 45%. Doppler parameters are consistent with high ventricular filling pressure. - Mitral valve: There was mild regurgitation. - Left atrium: The atrium was  moderately dilated. - Tricuspid valve: There was moderate regurgitation. - Pulmonary arteries: PA peak pressure: 72 mm Hg (S).  Impressions:  - Apical wall motion abnormalities suggestive of Takotsubo stress cardiomyopathy.       History of Present Illness  Rita Stephens is a 59 y.o.female with past medical history outlined below, transferred from Rita Stephens to Rita Stephens 02/22/15 with abnormal troponin I and concern for NSTEMI.   She was apparently standing in her kitchen around dinner time, had just gotten off the phone speaking with her sister. Her daughter states that she began to act very unusual, anxious and agitated, stated that thew number "2" that she saw the phone began to "appear all over the room." She then reportedly began to sink to the floor and started "shaking". Her daughter felt like she was having a seizure. Shaking stopped, was followed by grunting breathing which her husband witnessed. EMS was summoned. While waiting for arrival, patient apparently did wake up and became quite agitated, began to try to get up. Husband stated that she was "fighting" him quite vigorously to try and stand up, but he kept her lying down the floor. She was ultimately transported to the ER, reportedly was much more calm at that time, did complain of some "tightness" in her chest after all this agitation. No seizure activity was witnessed in the ER. Her initial ECG did show mild and not definitively diagnostic ST segment elevation, predominantly in the lateral but to some degree inferior leads concerning for injury current, troponin I level was 1.11, but with repeat tracing the ST segment changes had improved. She did have a head CT scan done that did not show any acute process. UDS was positive for benzodiazepines.  She has not had an episode quite like this before per family. She is not aware of  any known seizure disorder. She reportedly suffers from chronic pain, and is on medications at home  including relatively high doses of Cymbalta, Xanax, oxycodone, and trazodone. In private, the patient's husband and daughter told Rita Stephens that they are concerned that she overuses these medications, and that this is a relatively chronic problem.  Rita Stephens did not indicate to me any recent problems with exertional chest tightness or shortness of breath. She has no known history of cardiac disease. She does have a long-standing history of hypertension.  Hospital Course  She was admitted to the stepdown unit and started on heparin per pharmacy protocol aspirin, high-dose statin, and beta blocker in addition to ARB. Peak of troponin overnight was 6.47 with ongoing chest pain. Started IV NTG gtt. LHC showed severe left ventricular systolic dysfunction in a pattern of Takatsubo cardiomyopathy. Echo showed LV EF of 40-45%, mild LVH, moderate dilated LA, PA peak pressure: 72 mm Hg (S). Apical wall motion abnormalities suggestive of Takotsubo stress cardiomyopathy. Switched metoprolol tartrate to Coreg given Takotsubo cardiomyopathy and elevated BP. The patient was evaluated by neurology for seizure. Family describe figure of four posturing(right arm flexed, left extended) with post-ictal state. 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 She has a history of migraines, but did not tolerate topamax. She gets weekly headaches thus started on depakote 500mg  BID. MRI of brain without acute intracranial abnormality. The patient had episode of 3 loose stool. She was negative for C.diff. She was given supplement for low potassium.   She has been seen by Rita Stephens today and deemed ready for discharge home. All follow-up appointments have been scheduled. Discharge medications are listed below.   Rita Rack, MD will arrange outpatient f/u with EEG. WBC continued to trend down. She will need BMET to check her potassium and CBC to check  leucocytosis during outpatient visit.     Discharge Vitals Blood pressure 150/97, pulse 105, temperature 98.3 F (36.8 C), temperature source Oral, resp. rate 16, height 5\' 2"  (1.575 m), weight 164 lb 14.5 oz (74.8 kg), SpO2 98 %.  Filed Weights   02/23/15 0418 02/24/15 0354 02/25/15 0358  Weight: 168 lb 14 oz (76.6 kg) 168 lb 10.4 oz (76.5 kg) 164 lb 14.5 oz (74.8 kg)    Labs  CBC  Recent Labs  02/24/15 0325 02/25/15 0115  WBC 18.1* 15.1*  HGB 12.2 12.1  HCT 35.5* 35.3*  MCV 87.2 86.7  PLT 451* 99991111*   Basic Metabolic Panel  Recent Labs  02/24/15 1254 02/25/15 0115  NA 127* 128*  K 3.1* 3.1*  CL 93* 94*  CO2 25 25  GLUCOSE 129* 119*  BUN <5* <5*  CREATININE 0.73 0.65  CALCIUM 9.3 9.2   Cardiac Enzymes  Recent Labs  02/23/15 0023 02/23/15 0535 02/23/15 1158  TROPONINI 6.41* 6.47* 4.25*   BNP Invalid input(s): POCBNP D-Dimer  Recent Labs  02/23/15 0023  DDIMER 1.66*   Fasting Lipid Panel  Recent Labs  02/23/15 0338  CHOL 208*  HDL 43  LDLCALC 146*  TRIG 95  CHOLHDL 4.8   Thyroid Function Tests  Recent Labs  02/24/15 0325  TSH 0.639    Disposition  Pt is being discharged home today in good condition.  Follow-up Plans & Appointments  Follow-up Information    Follow up with Rozann Lesches, MD.   Specialty:  Cardiology   Why:  The office will call with Appoinment date and time.  Contact information:   [place] [address] [phone]       Follow up with Greta Doom.   Contact information:   Office will call with appoinment date and time.           Discharge Instructions    Amb Referral to Cardiac Rehabilitation    Complete by:  As directed   Diagnosis:  Myocardial Infarction     Ambulatory referral to Neurology    Complete by:  As directed   An appointment is requested in approximately: 2 - 4 weeks     Call MD for:  redness, tenderness, or signs of infection (pain, swelling, redness,  odor or green/yellow discharge around incision site)    Complete by:  As directed      Diet - low sodium heart healthy    Complete by:  As directed      Discharge instructions    Complete by:  As directed   NO HEAVY LIFTING (>10lbs) X 2 WEEKS. NO SEXUAL ACTIVITY X 2 WEEKS. NO DRIVING X 1 WEEK. NO SOAKING BATHS, HOT TUBS, POOLS, ETC., X 7 DAYS.  No driving until seen in clinic or cleared by neurology.     Increase activity slowly    Complete by:  As directed            F/u Labs/Studies: She will need BMET to check her potassium and CBC to check leucocytosis during outpatient visit.    Discharge Medications    Medication List    TAKE these medications        ALPRAZolam 1 MG tablet  Commonly known as:  XANAX  Take 1 mg by mouth 4 (four) times daily as needed for anxiety.     aspirin 81 MG EC tablet  Take 1 tablet (81 mg total) by mouth daily.     atorvastatin 40 MG tablet  Commonly known as:  LIPITOR  Take 1 tablet (40 mg total) by mouth daily at 6 PM.     carvedilol 6.25 MG tablet  Commonly known as:  COREG  Take 1 tablet (6.25 mg total) by mouth 2 (two) times daily with a meal.     divalproex 500 MG DR tablet  Commonly known as:  DEPAKOTE  Take 1 tablet (500 mg total) by mouth every 12 (twelve) hours.     DOANS PILLS PO  Take 2 tablets by mouth daily as needed (pain).     DULoxetine 30 MG capsule  Commonly known as:  CYMBALTA  Take 30 mg by mouth daily. Take 30 mg capsule along with 60 mg capsule to equal 90 mg daily.     DULoxetine 60 MG capsule  Commonly known as:  CYMBALTA  Take 60 mg by mouth daily. Take 60 mg capsule along with a 30 mg capsule to equal 90 mg daily.     furosemide 20 MG tablet  Commonly known as:  LASIX  Take 20 mg by mouth daily as needed for fluid.     hydrochlorothiazide 12.5 MG capsule  Commonly known as:  MICROZIDE  Take 12.5 mg by mouth daily.     ibuprofen 200 MG tablet  Commonly known as:  ADVIL,MOTRIN  Take 400-800 mg by  mouth every 6 (six) hours as needed for moderate pain.     levothyroxine 112 MCG tablet  Commonly known as:  SYNTHROID, LEVOTHROID  Take 112 mcg by mouth daily before breakfast.     multivitamin with minerals Tabs tablet  Take 1 tablet by mouth daily.     olmesartan 40 MG tablet  Commonly known as:  BENICAR  Take 40 mg by mouth daily.     oxyCODONE 15 MG immediate release tablet  Commonly known as:  ROXICODONE  Take 15 mg by mouth every 4 (four) hours as needed for pain.     traZODone 100 MG tablet  Commonly known as:  DESYREL  Take 200 mg by mouth at bedtime.        Duration of Discharge Encounter   Greater than 30 minutes including physician time.  Signed, Deaysia Grigoryan PA-C 02/25/2015, 1:29 PM

## 2015-02-25 NOTE — Progress Notes (Signed)
K 3.1, orders to give 40 PO potassium.

## 2015-02-25 NOTE — Progress Notes (Signed)
Discharge instructions reviewed with patient. Patient verbalized understanding. Discharged via private vehicle. 

## 2015-02-25 NOTE — Progress Notes (Signed)
Subjective: No further events.   Exam: Filed Vitals:   02/25/15 0730  BP: 154/100  Pulse:   Temp: 98.2 F (36.8 C)  Resp: 16   Gen: In bed, NAD Resp: non-labored breathing, no acute distress Abd: soft, nt  Neuro: MS: awake, alert, interactive and appropriate CN: EOMI, face symmetric Motor: MAEW Sensory:intact to LT   Impression: 59 yo F with new onset seizure. Though her previous epsiodes of confusion could be simply medication related, there are some concerning for possible partial seizures in the past(sudden onset of confusion). Given her clear partial semiology(figure of four posturing) with possible seizures in the past and patient's wishes, I feel starting AED therapy is reasonable.   She also has migraines and has not tolerated topamax in the past so I have started depakote.   Recommendations: 1) depakote DR 500mg  BID 2) I will request outpatient follow up. 3) Please call with further questions or concerns.   Roland Rack, MD [facility] [phone]  If 7pm- 7am, please page neurology on call as listed in [place].

## 2015-02-27 MED FILL — Lidocaine HCl Local Preservative Free (PF) Inj 1%: INTRAMUSCULAR | Qty: 30 | Status: AC

## 2015-02-27 MED FILL — Heparin Sodium (Porcine) 2 Unit/ML in Sodium Chloride 0.9%: INTRAMUSCULAR | Qty: 1000 | Status: AC

## 2015-03-12 ENCOUNTER — Encounter: Payer: Self-pay | Admitting: Neurology

## 2015-03-12 ENCOUNTER — Ambulatory Visit (INDEPENDENT_AMBULATORY_CARE_PROVIDER_SITE_OTHER): Payer: 59 | Admitting: Neurology

## 2015-03-12 VITALS — BP 127/78 | HR 68 | Ht 62.0 in | Wt 173.0 lb

## 2015-03-12 DIAGNOSIS — R569 Unspecified convulsions: Secondary | ICD-10-CM

## 2015-03-12 MED ORDER — ZONISAMIDE 50 MG PO CAPS: ORAL_CAPSULE | ORAL | Status: DC

## 2015-03-12 NOTE — Patient Instructions (Addendum)
Taper off of the Valproic acid to one tablet a day for 2 weeks, then stop. Start on the zonegran for seizures now. We will check an EEG.   Epilepsy Epilepsy is a disorder in which a person has repeated seizures over time. A seizure is a release of abnormal electrical activity in the brain. Seizures can cause a change in attention, behavior, or the ability to remain awake and alert (altered mental status). Seizures often involve uncontrollable shaking (convulsions).  Most people with epilepsy lead normal lives. However, people with epilepsy are at an increased risk of falls, accidents, and injuries. Therefore, it is important to begin treatment right away. CAUSES  Epilepsy has many possible causes. Anything that disturbs the normal pattern of brain cell activity can lead to seizures. This may include:   Head injury.  Birth trauma.  High fever as a child.  Stroke.  Bleeding into or around the brain.  Certain drugs.  Prolonged low oxygen, such as what occurs after CPR efforts.  Abnormal brain development.  Certain illnesses, such as meningitis, encephalitis (brain infection), malaria, and other infections.  An imbalance of nerve signaling chemicals (neurotransmitters).  SIGNS AND SYMPTOMS  The symptoms of a seizure can vary greatly from one person to another. Right before a seizure, you may have a warning (aura) that a seizure is about to occur. An aura may include the following symptoms:  Fear or anxiety.  Nausea.  Feeling like the room is spinning (vertigo).  Vision changes, such as seeing flashing lights or spots. Common symptoms during a seizure include:  Abnormal sensations, such as an abnormal smell or a bitter taste in the mouth.   Sudden, general body stiffness.   Convulsions that involve rhythmic jerking of the face, arm, or leg on one or both sides.   Sudden change in consciousness.   Appearing to be awake but not responding.   Appearing to be asleep  but cannot be awakened.   Grimacing, chewing, lip smacking, drooling, tongue biting, or loss of bowel or bladder control. After a seizure, you may feel sleepy for a while. DIAGNOSIS  Your health care provider will ask about your symptoms and take a medical history. Descriptions from any witnesses to your seizures will be very helpful in the diagnosis. A physical exam, including a detailed neurological exam, is necessary. Various tests may be done, such as:   An electroencephalogram (EEG). This is a painless test of your brain waves. In this test, a diagram is created of your brain waves. These diagrams can be interpreted by a specialist.  An MRI of the brain.   A CT scan of the brain.   A spinal tap (lumbar puncture, LP).  Blood tests to check for signs of infection or abnormal blood chemistry. TREATMENT  There is no cure for epilepsy, but it is generally treatable. Once epilepsy is diagnosed, it is important to begin treatment as soon as possible. For most people with epilepsy, seizures can be controlled with medicines. The following may also be used:  A pacemaker for the brain (vagus nerve stimulator) can be used for people with seizures that are not well controlled by medicine.  Surgery on the brain. For some people, epilepsy eventually goes away. HOME CARE INSTRUCTIONS   Follow your health care provider's recommendations on driving and safety in normal activities.  Get enough rest. Lack of sleep can cause seizures.  Only take over-the-counter or prescription medicines as directed by your health care provider. Take any prescribed  medicine exactly as directed.  Avoid any known triggers of your seizures.  Keep a seizure diary. Record what you recall about any seizure, especially any possible trigger.   Make sure the people you live and work with know that you are prone to seizures. They should receive instructions on how to help you. In general, a witness to a seizure should:    Cushion your head and body.   Turn you on your side.   Avoid unnecessarily restraining you.   Not place anything inside your mouth.   Call for emergency medical help if there is any question about what has occurred.   Follow up with your health care provider as directed. You may need regular blood tests to monitor the levels of your medicine.  SEEK MEDICAL CARE IF:   You develop signs of infection or other illness. This might increase the risk of a seizure.   You seem to be having more frequent seizures.   Your seizure pattern is changing.  SEEK IMMEDIATE MEDICAL CARE IF:   You have a seizure that does not stop after a few moments.   You have a seizure that causes any difficulty in breathing.   You have a seizure that results in a very severe headache.   You have a seizure that leaves you with the inability to speak or use a part of your body.    This information is not intended to replace advice given to you by your health care provider. Make sure you discuss any questions you have with your health care provider.   Document Released: 04/07/2005 Document Revised: 01/26/2013 Document Reviewed: 11/17/2012 Elsevier Interactive Patient Education [employer].

## 2015-03-12 NOTE — Progress Notes (Signed)
Reason for visit: Seizures  Referring physician: [facility]  Rita Stephens is a 59 y.o. female  History of present illness:  Rita Stephens is a 3 year old right-handed white female with a history of an admission to the hospital that occurred on 02/22/2015. The patient had an event at home that was felt related to a seizure event. The patient was looking at her answering machine, she saw the number 2, when she looked up, she could see multiple 2's throughout. She tried to talk, but went into a staring event, and then started to fall, with an associated generalized jerking episode. EMS was called, it took the patient about 15 or 20 minutes to become oriented to the people around her. The patient went to the hospital, and MRI brain evaluation showed some degree of chronic small vessel disease. The patient was found have a myocardial infarction, but no evidence of coronary artery disease. The patient had a mild cardiomyopathy, felt secondary to a Takotsubo cardiomyopathy. The patient has been placed on Depakote given the history of the seizure and migraine headache. She comes to this office for an evaluation. She has not had any further episodes. She is currently not operating a motor vehicle. She indicates that she is not tolerating the medication well secondary to emotional lability, depression. The patient takes Xanax 2 mg at night, she indicates that she did not miss a dose prior to the seizure.  Past Medical History  Diagnosis Date  . Migraine   . Essential hypertension   . History of pneumonia   . Anxiety   . Hypothyroidism   . Ovarian cyst   . Spinal stenosis   . Chronic pain     Past Surgical History  Procedure Laterality Date  . Cholecystectomy    . Doppler echocardiography    . Leep    . Gum surgery      Removed cyst  . Cardiac catheterization N/A 02/23/2015    Procedure: Left Heart Cath and Coronary Angiography;  Surgeon: Jettie Booze, MD;  Location: [facility];  Service: Cardiovascular;  Laterality: N/A;    Family History  Problem Relation Age of Onset  . Hypertension Father   . Breast cancer Mother     metastatic to brain  . Migraines Sister   . Migraines Brother   . Heart attack Maternal Grandfather   . Seizures Neg Hx     Social history:  reports that she has quit smoking. Her smoking use included Cigarettes. She has a 22.5 pack-year smoking history. She has never used smokeless tobacco. She reports that she does not drink alcohol or use illicit drugs.  Medications:  Prior to Admission medications   Medication Sig Start Date End Date Taking? Authorizing Provider  ALPRAZolam Duanne Moron) 1 MG tablet Take 1 mg by mouth 4 (four) times daily as needed for anxiety.    Yes Historical Provider, MD  aspirin EC 81 MG EC tablet Take 1 tablet (81 mg total) by mouth daily. 02/25/15  Yes Bhavinkumar Bhagat, PA  atorvastatin (LIPITOR) 40 MG tablet Take 1 tablet (40 mg total) by mouth daily at 6 PM. 02/25/15  Yes Bhavinkumar Bhagat, PA  carvedilol (COREG) 6.25 MG tablet Take 1 tablet (6.25 mg total) by mouth 2 (two) times daily with a meal. 02/25/15  Yes Bhavinkumar Bhagat, PA  Diclofenac Sodium (PENNSAID TD) Place 400 mg onto the skin 2 (two) times daily.   Yes Historical Provider, MD  divalproex (DEPAKOTE) 500 MG DR  tablet Take 1 tablet (500 mg total) by mouth every 12 (twelve) hours. 02/25/15  Yes Bhavinkumar Bhagat, PA  DULoxetine (CYMBALTA) 30 MG capsule Take 30 mg by mouth daily. Take 30 mg capsule along with 60 mg capsule to equal 90 mg daily.   Yes Historical Provider, MD  DULoxetine (CYMBALTA) 60 MG capsule Take 60 mg by mouth daily. Take 60 mg capsule along with a 30 mg capsule to equal 90 mg daily.   Yes Historical Provider, MD  furosemide (LASIX) 20 MG tablet Take 20 mg by mouth daily as needed for fluid.   Yes Historical Provider, MD  hydrochlorothiazide (MICROZIDE) 12.5 MG capsule Take 12.5 mg by mouth daily.  04/30/13  Yes Historical Provider, MD    ibuprofen (ADVIL,MOTRIN) 200 MG tablet Take 400-800 mg by mouth every 6 (six) hours as needed for moderate pain.   Yes Historical Provider, MD  levothyroxine (SYNTHROID, LEVOTHROID) 112 MCG tablet Take 112 mcg by mouth daily before breakfast.   Yes Historical Provider, MD  Magnesium Salicylate (DOANS PILLS PO) Take 2 tablets by mouth daily as needed (pain).   Yes Historical Provider, MD  Multiple Vitamin (MULTIVITAMIN WITH MINERALS) TABS tablet Take 1 tablet by mouth daily.   Yes Historical Provider, MD  olmesartan (BENICAR) 40 MG tablet Take 40 mg by mouth daily.   Yes Historical Provider, MD  oxyCODONE (ROXICODONE) 15 MG immediate release tablet Take 15 mg by mouth every 4 (four) hours as needed for pain.   Yes Historical Provider, MD  traZODone (DESYREL) 100 MG tablet Take 200 mg by mouth at bedtime.   Yes Historical Provider, MD      Allergies  Allergen Reactions  . Amitriptyline Itching    Allergy identified by daughter  . Actifed Cold-Allergy [Chlorpheniramine-Phenyleph Er] Other (See Comments)    ALTERS MENTAL STATUS  . Sudafed [Pseudoephedrine Hcl] Other (See Comments)    Climbs walls  . Other Palpitations    Topamax    ROS:  Out of a complete 14 system review of symptoms, the patient complains only of the following symptoms, and all other reviewed systems are negative.  Short of breath Seizures, tremor Anxiety Insomnia  Blood pressure 127/78, pulse 68, height 5\' 2"  (1.575 m), weight 173 lb (78.472 kg).  Physical Exam  General: The patient is alert and cooperative at the time of the examination.  Eyes: Pupils are equal, round, and reactive to light. Discs are flat bilaterally.  Neck: The neck is supple, no carotid bruits are noted.  Respiratory: The respiratory examination is clear.  Cardiovascular: The cardiovascular examination reveals a regular rate and rhythm, no obvious murmurs or rubs are noted.  Skin: Extremities are without significant  edema.  Neurologic Exam  Mental status: The patient is alert and oriented x 3 at the time of the examination. The patient has apparent normal recent and remote memory, with an apparently normal attention span and concentration ability.  Cranial nerves: Facial symmetry is present. There is good sensation of the face to pinprick and soft touch bilaterally. The strength of the facial muscles and the muscles to head turning and shoulder shrug are normal bilaterally. Speech is well enunciated, no aphasia or dysarthria is noted. Extraocular movements are full. Visual fields are full. The tongue is midline, and the patient has symmetric elevation of the soft palate. No obvious hearing deficits are noted.  Motor: The motor testing reveals 5 over 5 strength of all 4 extremities. Good symmetric motor tone is noted throughout.  Sensory:  Sensory testing is intact to pinprick, soft touch, vibration sensation, and position sense on all 4 extremities. No evidence of extinction is noted.  Coordination: Cerebellar testing reveals good finger-nose-finger and heel-to-shin bilaterally.  Gait and station: Gait is normal. Tandem gait is normal. Romberg is negative. No drift is seen.  Reflexes: Deep tendon reflexes are symmetric and normal bilaterally. Toes are downgoing bilaterally.   MRI brain 02/24/15:  IMPRESSION: 1. No acute intracranial abnormality. 2. Mildly progressed chronic nonspecific cerebral white matter signal changes since 2010.  * MRI scan images were reviewed online. I agree with the written report.   Assessment/Plan:  1. Seizure event  2. Migraine headache  The patient is on Depakote, she cannot tolerate the medication well. She will taper off of the medication going to 1 tablet at night for 2 weeks then stop the medication. She will be placed on Zonegran. In the past she did not tolerate Topamax secondary to cognitive side effects. She had been placed on this for migraine headaches.  The patient will undergo an EEG study, she will follow-up in 4 months. She is not to operate a motor vehicle for at least 6 months.  Jill Alexanders MD 03/12/2015 7:39 PM  [facility] [address] [place], [place] [address]  Phone [phone] Fax [phone]

## 2015-03-30 ENCOUNTER — Ambulatory Visit (INDEPENDENT_AMBULATORY_CARE_PROVIDER_SITE_OTHER): Payer: Commercial Managed Care - HMO | Admitting: Cardiology

## 2015-03-30 ENCOUNTER — Encounter: Payer: Self-pay | Admitting: Cardiology

## 2015-03-30 VITALS — BP 122/68 | HR 83 | Ht 62.0 in | Wt 168.0 lb

## 2015-03-30 DIAGNOSIS — I5181 Takotsubo syndrome: Secondary | ICD-10-CM | POA: Diagnosis not present

## 2015-03-30 DIAGNOSIS — E785 Hyperlipidemia, unspecified: Secondary | ICD-10-CM | POA: Diagnosis not present

## 2015-03-30 DIAGNOSIS — I10 Essential (primary) hypertension: Secondary | ICD-10-CM | POA: Diagnosis not present

## 2015-03-30 NOTE — Progress Notes (Signed)
Cardiology Office Note  Date: 03/30/2015   ID: JESSICAROSE Stephens, DOB [birth year], MRN [id number]  PCP: Rita Stephens., MD  Primary Cardiologist: Rita Lesches, MD   Chief Complaint  Patient presents with  . Takotsubo cardiomyopathy   History of Present Illness: Rita Stephens is a 59 y.o. female that I admitted to [facility] while on call back in November with abnormal troponin I level suggestive of possible NSTEMI. Her presentation was actually fairly unusual - please refer to the history and physical. She ultimately underwent a cardiac catheterization that demonstrated normal coronary arteries, and had findings consistent with Takotsubo cardiomyopathy. Echocardiogram is reviewed below.  She is here today with her husband for a follow-up visit. States that she has been doing well, no chest pain or unusual shortness of breath. She states that she has been compliant with her medications. From a cardiac perspective she is on Coreg, and a car, Lasix, Lipitor, and aspirin.  Today we discussed her hospital stay, expected typical improvement in LVEF with Takotsubo cardiomyopathy. Encouraged her to continue with walking for exercise.  She has not yet followed up with Dr. Gerarda Stephens, but has a planned visit later this month.  I did review her neurology visit note from Dr. Jannifer Stephens in November. She is being treated for seizure, recently transitioned to [place] with planned follow-up EEG.  Past Medical History  Diagnosis Date  . Migraine   . Essential hypertension   . History of pneumonia   . Anxiety   . Hypothyroidism   . Ovarian cyst   . Spinal stenosis   . Chronic pain   . Takotsubo syndrome     November 2016  . History of cardiac catheterization     Normal coronaries November 2016    Past Surgical History  Procedure Laterality Date  . Cholecystectomy    . Doppler echocardiography    . Leep    . Gum surgery      Removed cyst  . Cardiac catheterization N/A 02/23/2015   Procedure: Left Heart Cath and Coronary Angiography;  Surgeon: Jettie Booze, MD;  Location: [facility];  Service: Cardiovascular;  Laterality: N/A;    Current Outpatient Prescriptions  Medication Sig Dispense Refill  . ALPRAZolam (XANAX) 1 MG tablet Take 1 mg by mouth 4 (four) times daily as needed for anxiety.     [name] Kitchen aspirin EC 81 MG EC tablet Take 1 tablet (81 mg total) by mouth daily.    [name] Kitchen atorvastatin (LIPITOR) 40 MG tablet Take 1 tablet (40 mg total) by mouth daily at 6 PM. 30 tablet 11  . carvedilol (COREG) 6.25 MG tablet Take 1 tablet (6.25 mg total) by mouth 2 (two) times daily with a meal. 60 tablet 11  . Diclofenac Sodium (PENNSAID TD) Place 400 mg onto the skin 2 (two) times daily.    . DULoxetine (CYMBALTA) 30 MG capsule Take 30 mg by mouth daily. Take 30 mg capsule along with 60 mg capsule to equal 90 mg daily.    . DULoxetine (CYMBALTA) 60 MG capsule Take 60 mg by mouth daily. Take 60 mg capsule along with a 30 mg capsule to equal 90 mg daily.    . furosemide (LASIX) 20 MG tablet Take 20 mg by mouth daily as needed for fluid.    . hydrochlorothiazide (MICROZIDE) 12.5 MG capsule Take 12.5 mg by mouth daily.     [name] Kitchen ibuprofen (ADVIL,MOTRIN) 200 MG tablet Take 400-800 mg by mouth every 6 (six) hours  as needed for moderate pain.    [name] Kitchen levothyroxine (SYNTHROID, LEVOTHROID) 112 MCG tablet Take 112 mcg by mouth daily before breakfast.    . Magnesium Salicylate (DOANS PILLS PO) Take 2 tablets by mouth daily as needed (pain).    . Multiple Vitamin (MULTIVITAMIN WITH MINERALS) TABS tablet Take 1 tablet by mouth daily.    [name] Kitchen olmesartan (BENICAR) 40 MG tablet Take 40 mg by mouth daily.    [name] Kitchen oxyCODONE (ROXICODONE) 15 MG immediate release tablet Take 15 mg by mouth every 4 (four) hours as needed for pain.    . traZODone (DESYREL) 100 MG tablet Take 200 mg by mouth at bedtime.    [name] Kitchen zonisamide (ZONEGRAN) 50 MG capsule One capsule twice a day for 2 weeks, then take 2 capsules twice a day  120 capsule 3   No current facility-administered medications for this visit.   Allergies:  Amitriptyline; Actifed cold-allergy; Sudafed; and Other   Social History: The patient  reports that she has quit smoking. Her smoking use included Cigarettes. She has a 22.5 pack-year smoking history. She has never used smokeless tobacco. She reports that she does not drink alcohol or use illicit drugs.   ROS:  Please see the history of present illness. Otherwise, complete review of systems is positive for intermittent trouble with insomnia, anxiety.  All other systems are reviewed and negative.   Physical Exam: VS:  BP 122/68 mmHg  Pulse 83  Ht 5\' 2"  (1.575 m)  Wt 168 lb (76.204 kg)  BMI 30.72 kg/m2  SpO2 97%, BMI Body mass index is 30.72 kg/(m^2).  Wt Readings from Last 3 Encounters:  03/30/15 168 lb (76.204 kg)  03/12/15 173 lb (78.472 kg)  02/25/15 164 lb 14.5 oz (74.8 kg)    General: Patient appears comfortable at rest. HEENT: Conjunctiva and lids normal, oropharynx clear. Neck: Supple, no elevated JVP or carotid bruits, no thyromegaly. Lungs: Clear to auscultation, nonlabored breathing at rest. Cardiac: Regular rate and rhythm, no S3 or significant systolic murmur, no pericardial rub. Abdomen: Soft, nontender, bowel sounds present, no guarding or rebound. Extremities: No pitting edema, distal pulses 2+. Skin: Warm and dry. Musculoskeletal: No kyphosis. Neuropsychiatric: Alert and oriented x3, affect grossly appropriate.  ECG: ECG is not ordered today.  Recent Labwork: 02/24/2015: TSH 0.639 02/25/2015: BUN <5*; Creatinine, Ser 0.65; Hemoglobin 12.1; Platelets 423*; Potassium 3.1*; Sodium 128*     Component Value Date/Time   CHOL 208* 02/23/2015 0338   TRIG 95 02/23/2015 0338   HDL 43 02/23/2015 0338   CHOLHDL 4.8 02/23/2015 0338   VLDL 19 02/23/2015 0338   LDLCALC 146* 02/23/2015 0338    Other Studies Reviewed Today:  Echocardiogram 02/23/2015: Study Conclusions  - Left  ventricle: The cavity size was normal. Wall thickness was normal. Systolic function was mildly to moderately reduced. The estimated ejection Stephens was in the range of 40% to 45%. Doppler parameters are consistent with high ventricular filling pressure. - Mitral valve: There was mild regurgitation. - Left atrium: The atrium was moderately dilated. - Tricuspid valve: There was moderate regurgitation. - Pulmonary arteries: PA peak pressure: 72 mm Hg (S).  Impressions:  - Apical wall motion abnormalities suggestive of Takotsubo stress cardiomyopathy.  Assessment and Plan:  1. Takotsubo cardiomyopathy diagnosed in November, LVEF 40-45% at that time. She is doing well clinically, we will continue current regimen and plan to repeat echocardiogram is prior to her next visit in 2 months. Would anticipate recovery in LVEF.  2. Normal coronary arteries documented  at cardiac catheterization in November.  3. Essential hypertension, blood pressure is well controlled today. No changes made to current regimen.  4. Hyperlipidemia, LDL 146. She is on Lipitor.  Current medicines were reviewed with the patient today.   Orders Placed This Encounter  Procedures  . Echocardiogram    Disposition: FU with me in 2 months.   Signed, Satira Sark, MD, [facility] 03/30/2015 1:37 PM    [facility] [address] [place], [place] [address] Phone: [phone]; Fax: [phone]

## 2015-03-30 NOTE — Patient Instructions (Signed)
Your physician recommends that you schedule a follow-up appointment in: 2 months with Dr Domenic Polite   Your physician recommends that you continue on your current medications as directed. Please refer to the Current Medication list given to you today.    If you need a refill on your cardiac medications before your next appointment, please call your pharmacy.   Your physician has requested that you have an echocardiogram JUST BEFORE NEXT VISIT. Echocardiography is a painless test that uses sound waves to create images of your heart. It provides your doctor with information about the size and shape of your heart and how well your heart's chambers and valves are working. This procedure takes approximately one hour. There are no restrictions for this procedure.   Thank you for choosing [facility] !

## 2015-04-11 ENCOUNTER — Ambulatory Visit (INDEPENDENT_AMBULATORY_CARE_PROVIDER_SITE_OTHER): Payer: Commercial Managed Care - HMO | Admitting: Neurology

## 2015-04-11 ENCOUNTER — Telehealth: Payer: Self-pay | Admitting: Neurology

## 2015-04-11 DIAGNOSIS — R569 Unspecified convulsions: Secondary | ICD-10-CM

## 2015-04-11 NOTE — Procedures (Signed)
     History: Rita Stephens is a 59 year old patient with a history of migraine headaches. The patient had an event on 02/22/2015 associated with a staring event, then subsequent generalized jerking. The patient is being evaluated for possible seizure events.  This is a routine EEG. No skull defects are noted. Medications include alprazolam, aspirin, Lipitor, Coreg, diclofenac, Cymbalta, Lasix, hydrochlorothiazide, ibuprofen, Synthroid, magnesium supplementation, multivitamins, Benicar, oxycodone, trazodone, and Zonegran.  EEG classification: Dysrhythmia grade 2 left temporal  Description of the recording: The background rhythms of this recording consists of a relatively well-modulated medium amplitude for rhythm of 8 Hz that is reactive to eye opening and closure. As the record progresses, the patient appears to remain in the waking state throughout the recording. Photic stimulation is performed, this results in a bilateral and symmetric photic driving response. Hyperventilation is then performed resulting in a slight buildup of the background rhythm activities without significant slowing seen. Episodically during the recording, rare sharp wave activity is seen emanating from the left mid temporal region. At no time does there appear to be evidence of spike or spike-wave discharges. Occasionally brief theta frequency slowing is seen emanating from the left temporal regions as well. EKG monitor shows no evidence of cardiac rhythm abnormalities with a heart rate of 60.  Impression: This is an abnormal EEG recording secondary to occasional sharp transients emanating from the left temporal region suggesting a left brain abnormality with a lowered seizure threshold. No electrographic seizures were recorded during the study, however.

## 2015-04-11 NOTE — Telephone Encounter (Signed)
I called the patient. The EEG study showed some mild irritability in the left brain. No electrographic seizures. The patient is getting up on Zonegran to treat both the headache and for the seizures. She will contact me if she has any further events.

## 2015-05-28 ENCOUNTER — Ambulatory Visit (HOSPITAL_COMMUNITY)
Admission: RE | Admit: 2015-05-28 | Discharge: 2015-05-28 | Disposition: A | Discharge status: Home / Self Care | Payer: Commercial Managed Care - HMO | Source: Ambulatory Visit | Attending: Cardiology | Admitting: Cardiology

## 2015-05-28 DIAGNOSIS — I5181 Takotsubo syndrome: Secondary | ICD-10-CM | POA: Diagnosis not present

## 2015-05-28 DIAGNOSIS — I059 Rheumatic mitral valve disease, unspecified: Secondary | ICD-10-CM | POA: Insufficient documentation

## 2015-05-28 DIAGNOSIS — F17200 Nicotine dependence, unspecified, uncomplicated: Secondary | ICD-10-CM | POA: Diagnosis not present

## 2015-05-28 DIAGNOSIS — I10 Essential (primary) hypertension: Secondary | ICD-10-CM | POA: Insufficient documentation

## 2015-05-28 DIAGNOSIS — I517 Cardiomegaly: Secondary | ICD-10-CM | POA: Insufficient documentation

## 2015-05-31 ENCOUNTER — Ambulatory Visit: Payer: Commercial Managed Care - HMO | Admitting: Cardiology

## 2015-06-08 ENCOUNTER — Encounter: Payer: Self-pay | Admitting: Cardiology

## 2015-06-08 ENCOUNTER — Ambulatory Visit (INDEPENDENT_AMBULATORY_CARE_PROVIDER_SITE_OTHER): Payer: Commercial Managed Care - HMO | Admitting: Cardiology

## 2015-06-08 VITALS — BP 90/44 | HR 70 | Ht 62.0 in | Wt 181.0 lb

## 2015-06-08 DIAGNOSIS — I5181 Takotsubo syndrome: Secondary | ICD-10-CM

## 2015-06-08 DIAGNOSIS — I10 Essential (primary) hypertension: Secondary | ICD-10-CM | POA: Diagnosis not present

## 2015-06-08 NOTE — Progress Notes (Signed)
Cardiology Office Note  Date: 06/08/2015   ID: Rita Stephens, DOB 01-Jul-1955, MRN [id number]  PCP: Glo Herring., MD  Primary Cardiologist: Rozann Lesches, MD   Chief Complaint  Patient presents with  . History of Takotsubo cardiomyopathy    History of Present Illness: Rita Stephens is a 60 y.o. female last seen in December 2016. She is here today with her husband for a follow-up visit. She does not report any recurrent chest pain, no palpitations or syncope. Recent follow-up echocardiogram showed normalization of LVEF. We discussed the results today.  She does report some reflux symptoms, has not tried any antacids treatments, but had been on medication for this in the past.  I reviewed her current medications. She is on aspirin, Lipitor, Coreg, Lasix, and Benicar.  Past Medical History  Diagnosis Date  . Migraine   . Essential hypertension   . History of pneumonia   . Anxiety   . Hypothyroidism   . Ovarian cyst   . Spinal stenosis   . Chronic pain   . Takotsubo syndrome     November 2016  . History of cardiac catheterization     Normal coronaries November 2016    Current Outpatient Prescriptions  Medication Sig Dispense Refill  . ALPRAZolam (XANAX) 1 MG tablet Take 1 mg by mouth 4 (four) times daily as needed for anxiety.     [name] Kitchen aspirin EC 81 MG EC tablet Take 1 tablet (81 mg total) by mouth daily.    [name] Kitchen atorvastatin (LIPITOR) 40 MG tablet Take 1 tablet (40 mg total) by mouth daily at 6 PM. 30 tablet 11  . carvedilol (COREG) 6.25 MG tablet Take 1 tablet (6.25 mg total) by mouth 2 (two) times daily with a meal. 60 tablet 11  . Diclofenac Sodium (PENNSAID TD) Place 400 mg onto the skin 2 (two) times daily.    . DULoxetine (CYMBALTA) 30 MG capsule Take 30 mg by mouth daily. Take 30 mg capsule along with 60 mg capsule to equal 90 mg daily.    . DULoxetine (CYMBALTA) 60 MG capsule Take 60 mg by mouth daily. Take 60 mg capsule along with a 30 mg capsule to equal 90 mg  daily.    . furosemide (LASIX) 20 MG tablet Take 20 mg by mouth daily as needed for fluid.    . hydrochlorothiazide (MICROZIDE) 12.5 MG capsule Take 12.5 mg by mouth daily.     [name] Kitchen ibuprofen (ADVIL,MOTRIN) 200 MG tablet Take 400-800 mg by mouth every 6 (six) hours as needed for moderate pain.    [name] Kitchen levothyroxine (SYNTHROID, LEVOTHROID) 112 MCG tablet Take 112 mcg by mouth daily before breakfast.    . Magnesium Salicylate (DOANS PILLS PO) Take 2 tablets by mouth daily as needed (pain).    . Multiple Vitamin (MULTIVITAMIN WITH MINERALS) TABS tablet Take 1 tablet by mouth daily.    [name] Kitchen olmesartan (BENICAR) 40 MG tablet Take 40 mg by mouth daily.    [name] Kitchen oxyCODONE (ROXICODONE) 15 MG immediate release tablet Take 15 mg by mouth every 4 (four) hours as needed for pain.    . traZODone (DESYREL) 100 MG tablet Take 200 mg by mouth at bedtime.    [name] Kitchen zonisamide (ZONEGRAN) 50 MG capsule One capsule twice a day for 2 weeks, then take 2 capsules twice a day 120 capsule 3   No current facility-administered medications for this visit.   Allergies:  Amitriptyline; Actifed cold-allergy; Sudafed; and Other   Social History: The patient  reports that she has quit smoking. Her smoking use included Cigarettes. She has a 22.5 pack-year smoking history. She has never used smokeless tobacco. She reports that she does not drink alcohol or use illicit drugs.   ROS:  Please see the history of present illness. Otherwise, complete review of systems is positive for fatigue.  All other systems are reviewed and negative.   Physical Exam: VS:  BP 90/44 mmHg  Pulse 70  Ht 5\' 2"  (1.575 m)  Wt 181 lb (82.101 kg)  BMI 33.10 kg/m2  SpO2 99%, BMI Body mass index is 33.1 kg/(m^2).  Wt Readings from Last 3 Encounters:  06/08/15 181 lb (82.101 kg)  03/30/15 168 lb (76.204 kg)  03/12/15 173 lb (78.472 kg)    General: Patient appears comfortable at rest. HEENT: Conjunctiva and lids normal, oropharynx clear. Neck: Supple, no elevated  JVP or carotid bruits, no thyromegaly. Lungs: Clear to auscultation, nonlabored breathing at rest. Cardiac: Regular rate and rhythm, no S3 or significant systolic murmur, no pericardial rub. Abdomen: Soft, nontender, bowel sounds present, no guarding or rebound. Extremities: No pitting edema, distal pulses 2+.  ECG: ECG is not ordered today.  Recent Labwork: 02/24/2015: TSH 0.639 02/25/2015: BUN <5*; Creatinine, Ser 0.65; Hemoglobin 12.1; Platelets 423*; Potassium 3.1*; Sodium 128*     Component Value Date/Time   CHOL 208* 02/23/2015 0338   TRIG 95 02/23/2015 0338   HDL 43 02/23/2015 0338   CHOLHDL 4.8 02/23/2015 0338   VLDL 19 02/23/2015 0338   LDLCALC 146* 02/23/2015 0338    Other Studies Reviewed Today:  Echocardiogram 05/28/2015: Study Conclusions  - Left ventricle: The cavity size was normal. Wall thickness was normal. Systolic function was normal. The estimated ejection fraction was in the range of 60% to 65%. Wall motion was normal; there were no regional wall motion abnormalities. Doppler parameters are consistent with abnormal left ventricular relaxation (grade 1 diastolic dysfunction). Indeterminate filling pressures. - Mitral valve: Calcified annulus. Mildly thickened leaflets . - Left atrium: The atrium was moderately dilated. - Inferior vena cava: The vessel was small, appearing collapsed, consistent with low central venous pressure.  Assessment and Plan:  1. History of Takotsubo cardiomyopathy with normalization of LVEF by recent follow-up echocardiogram. Blood pressure is low normal, she is asymptomatic. Would suggest continuing Coreg and Benicar, although may be able to come off of HCTZ and even Lasix. I asked her to follow-up with Dr. Gerarda Fraction and we will see her back in one year.  2. Normal coronary arteries documented at cardiac catheterization in November 2016.  Current medicines were reviewed with the patient today.  Disposition: FU with me in  1 year.   Signed, Satira Sark, MD, [facility] 06/08/2015 1:09 PM    [facility] at [facility] [address] [place], [place] [address] Phone: [phone]; Fax: [phone]

## 2015-06-08 NOTE — Patient Instructions (Signed)
Your physician recommends that you schedule a follow-up appointment in: as needed   Thank you for choosing [facility] !         

## 2015-06-18 DIAGNOSIS — Z0271 Encounter for disability determination: Secondary | ICD-10-CM

## 2015-07-10 ENCOUNTER — Ambulatory Visit (INDEPENDENT_AMBULATORY_CARE_PROVIDER_SITE_OTHER): Payer: Commercial Managed Care - HMO | Admitting: Adult Health

## 2015-07-10 ENCOUNTER — Encounter: Payer: Self-pay | Admitting: Adult Health

## 2015-07-10 VITALS — BP 138/85 | HR 61 | Ht 62.0 in | Wt 170.0 lb

## 2015-07-10 DIAGNOSIS — G43019 Migraine without aura, intractable, without status migrainosus: Secondary | ICD-10-CM

## 2015-07-10 DIAGNOSIS — R569 Unspecified convulsions: Secondary | ICD-10-CM

## 2015-07-10 MED ORDER — ZONISAMIDE 50 MG PO CAPS: ORAL_CAPSULE | ORAL | Status: DC

## 2015-07-10 NOTE — Progress Notes (Signed)
I have read the note, and I agree with the clinical assessment and plan.  Kmari Halter KEITH   

## 2015-07-10 NOTE — Progress Notes (Signed)
PATIENT: Rita Stephens DOB: 08/18/1955  REASON FOR VISIT: follow up- migraine headaches, seizure events HISTORY FROM: patient  HISTORY OF PRESENT ILLNESS: Rita Stephens is a 60 year old female with a history of migraine headaches and seizure events. She returns today for follow-up. She is currently taken Zonegran 100 mg twice a day. She reports that she is not had any additional seizure events. She is able to complete all ADLs independentely. She does not operate a motor vehicle at this time. The patient states that she continues to have frequent headaches. She has at least 6-8 migraines a month. Her headaches are normally located around the eyes. She states that recently she's been having more sinus headaches. She reports with her headache she does have photophobia and phonophobia. She states that the nausea and vomiting has resolved. She states that typically when she gets a headache she has to lay down in order for it to resolve. She denies any new neurological symptoms. She returns today for an evaluation.  HISTORY 03/12/15 (WILLIS):  Rita Stephens is a 57 year old right-handed white female with a history of an admission to the hospital that occurred on 02/22/2015. The patient had an event at home that was felt related to a seizure event. The patient was looking at her answering machine, she saw the number 2, when she looked up, she could see multiple 2's throughout. She tried to talk, but went into a staring event, and then started to fall, with an associated generalized jerking episode. EMS was called, it took the patient about 15 or 20 minutes to become oriented to the people around her. The patient went to the hospital, and MRI brain evaluation showed some degree of chronic small vessel disease. The patient was found have a myocardial infarction, but no evidence of coronary artery disease. The patient had a mild cardiomyopathy, felt secondary to a Takotsubo cardiomyopathy. The patient has been placed on  Depakote given the history of the seizure and migraine headache. She comes to this office for an evaluation. She has not had any further episodes. She is currently not operating a motor vehicle. She indicates that she is not tolerating the medication well secondary to emotional lability, depression. The patient takes Xanax 2 mg at night, she indicates that she did not miss a dose prior to the seizure.  REVIEW OF SYSTEMS: Out of a complete 14 system review of symptoms, the patient complains only of the following symptoms, and all other reviewed systems are negative.  See history of present illness  ALLERGIES: Allergies  Allergen Reactions  . Amitriptyline Itching    Allergy identified by daughter  . Actifed Cold-Allergy [Chlorpheniramine-Phenyleph Er] Other (See Comments)    ALTERS MENTAL STATUS  . Sudafed [Pseudoephedrine Hcl] Other (See Comments)    Climbs walls  . Other Palpitations    Topamax    HOME MEDICATIONS: Outpatient Prescriptions Prior to Visit  Medication Sig Dispense Refill  . ALPRAZolam (XANAX) 1 MG tablet Take 1 mg by mouth 4 (four) times daily as needed for anxiety.     [name] Kitchen aspirin EC 81 MG EC tablet Take 1 tablet (81 mg total) by mouth daily.    [name] Kitchen atorvastatin (LIPITOR) 40 MG tablet Take 1 tablet (40 mg total) by mouth daily at 6 PM. 30 tablet 11  . carvedilol (COREG) 6.25 MG tablet Take 1 tablet (6.25 mg total) by mouth 2 (two) times daily with a meal. 60 tablet 11  . Diclofenac Sodium (PENNSAID TD) Place 400 mg onto  the skin 2 (two) times daily.    . DULoxetine (CYMBALTA) 30 MG capsule Take 30 mg by mouth daily. Take 30 mg capsule along with 60 mg capsule to equal 90 mg daily.    . DULoxetine (CYMBALTA) 60 MG capsule Take 60 mg by mouth daily. Take 60 mg capsule along with a 30 mg capsule to equal 90 mg daily.    . furosemide (LASIX) 20 MG tablet Take 20 mg by mouth daily as needed for fluid.    . hydrochlorothiazide (MICROZIDE) 12.5 MG capsule Take 12.5 mg by mouth  daily.     [name] Kitchen ibuprofen (ADVIL,MOTRIN) 200 MG tablet Take 400-800 mg by mouth every 6 (six) hours as needed for moderate pain.    [name] Kitchen levothyroxine (SYNTHROID, LEVOTHROID) 112 MCG tablet Take 112 mcg by mouth daily before breakfast.    . Magnesium Salicylate (DOANS PILLS PO) Take 2 tablets by mouth daily as needed (pain).    . Multiple Vitamin (MULTIVITAMIN WITH MINERALS) TABS tablet Take 1 tablet by mouth daily.    [name] Kitchen olmesartan (BENICAR) 40 MG tablet Take 40 mg by mouth daily.    [name] Kitchen oxyCODONE (ROXICODONE) 15 MG immediate release tablet Take 15 mg by mouth every 4 (four) hours as needed for pain.    . traZODone (DESYREL) 100 MG tablet Take 200 mg by mouth at bedtime.    [name] Kitchen zonisamide (ZONEGRAN) 50 MG capsule One capsule twice a day for 2 weeks, then take 2 capsules twice a day 120 capsule 3   No facility-administered medications prior to visit.    PAST MEDICAL HISTORY: Past Medical History  Diagnosis Date  . Migraine   . Essential hypertension   . History of pneumonia   . Anxiety   . Hypothyroidism   . Ovarian cyst   . Spinal stenosis   . Chronic pain   . Takotsubo syndrome     November 2016  . History of cardiac catheterization     Normal coronaries November 2016    PAST SURGICAL HISTORY: Past Surgical History  Procedure Laterality Date  . Cholecystectomy    . Doppler echocardiography    . Leep    . Gum surgery      Removed cyst  . Cardiac catheterization N/A 02/23/2015    Procedure: Left Heart Cath and Coronary Angiography;  Surgeon: Jettie Booze, MD;  Location: [facility];  Service: Cardiovascular;  Laterality: N/A;    FAMILY HISTORY: Family History  Problem Relation Age of Onset  . Hypertension Father   . Breast cancer Mother     metastatic to brain  . Migraines Sister   . Migraines Brother   . Heart attack Maternal Grandfather   . Seizures Neg Hx     SOCIAL HISTORY: Social History   Social History  . Marital Status: Married    Spouse Name: N/A    . Number of Children: 1  . Years of Education: HS   Occupational History  . Not on file.   Social History Main Topics  . Smoking status: Former Smoker -- 0.75 packs/day for 30 years    Types: Cigarettes  . Smokeless tobacco: Never Used  . Alcohol Use: No  . Drug Use: No  . Sexual Activity: Not on file   Other Topics Concern  . Not on file   Social History Narrative   She lives with husband and daughter.  She is working as a Radiation protection practitioner.      Patient does not drink caffeine.  Patient is right handed.             PHYSICAL EXAM  Filed Vitals:   07/10/15 1431  BP: 138/85  Pulse: 61  Height: 5\' 2"  (1.575 m)  Weight: 170 lb (77.111 kg)   Body mass index is 31.09 kg/(m^2).  Generalized: Well developed, in no acute distress   Neurological examination  Mentation: Alert oriented to time, place, history taking. Follows all commands speech and language fluent Cranial nerve II-XII: Pupils were equal round reactive to light. Extraocular movements were full, visual field were full on confrontational test. Facial sensation and strength were normal. Uvula tongue midline. Head turning and shoulder shrug  were normal and symmetric. Motor: The motor testing reveals 5 over 5 strength of all 4 extremities. Good symmetric motor tone is noted throughout.  Sensory: Sensory testing is intact to soft touch on all 4 extremities. No evidence of extinction is noted.  Coordination: Cerebellar testing reveals good finger-nose-finger and heel-to-shin bilaterally.  Gait and station: Gait is normal. Tandem gait is normal. Romberg is negative. No drift is seen.  Reflexes: Deep tendon reflexes are symmetric and normal bilaterally.   DIAGNOSTIC DATA (LABS, IMAGING, TESTING) - I reviewed patient records, labs, notes, testing and imaging myself where available.  Lab Results  Component Value Date   WBC 15.1* 02/25/2015   HGB 12.1 02/25/2015   HCT 35.3* 02/25/2015   MCV 86.7 02/25/2015   PLT 423*  02/25/2015      Component Value Date/Time   NA 128* 02/25/2015 0115   K 3.1* 02/25/2015 0115   CL 94* 02/25/2015 0115   CO2 25 02/25/2015 0115   GLUCOSE 119* 02/25/2015 0115   BUN <5* 02/25/2015 0115   CREATININE 0.65 02/25/2015 0115   CALCIUM 9.2 02/25/2015 0115   GFRNONAA >60 02/25/2015 0115   GFRAA >60 02/25/2015 0115   Lab Results  Component Value Date   CHOL 208* 02/23/2015   HDL 43 02/23/2015   LDLCALC 146* 02/23/2015   TRIG 95 02/23/2015   CHOLHDL 4.8 02/23/2015   Lab Results  Component Value Date   HGBA1C 5.7 05/04/2013   Lab Results  Component Value Date   VITAMINB12 >1500* 05/04/2013   Lab Results  Component Value Date   TSH 0.639 02/24/2015      ASSESSMENT AND PLAN 60 y.o. year old female  has a past medical history of Migraine; Essential hypertension; History of pneumonia; Anxiety; Hypothyroidism; Ovarian cyst; Spinal stenosis; Chronic pain; Takotsubo syndrome; and History of cardiac catheterization. here with:  1. Seizures 2. Migraines  The patient has not had any seizure events since the last visit. She continues to have frequent migraines. We will increase Zonegran 200 mg in the morning and 150 mg in the evening. Patient advised that if she does not have any seizure events she can resume driving in May. She will let us know if her headache frequency or severity does not improve. She will follow-up in 6 months or sooner if needed.     Ward Givens, MSN, NP-C 07/10/2015, 3:27 PM [facility] [address], [place] [employer], [place] [address] [phone]

## 2015-07-10 NOTE — Patient Instructions (Signed)
Increase Zonegran to 2 tablets in the morning and 3 tablets in the evening If you have any seizure events please let us know You can resume driving in May if you are seizure free If your symptoms worsen or you develop new symptoms please let us know.

## 2016-01-09 LAB — LAB REPORT - SCANNED
EGFR: 63
TSH: 0.86 (ref 0.41–5.90)

## 2016-01-10 ENCOUNTER — Ambulatory Visit: Payer: Commercial Managed Care - HMO | Admitting: Adult Health

## 2016-01-24 ENCOUNTER — Encounter (HOSPITAL_COMMUNITY): Payer: Self-pay | Admitting: Hematology & Oncology

## 2016-01-24 ENCOUNTER — Encounter (HOSPITAL_COMMUNITY): Payer: Commercial Managed Care - HMO | Attending: Hematology & Oncology | Admitting: Hematology & Oncology

## 2016-01-24 ENCOUNTER — Encounter (HOSPITAL_COMMUNITY): Payer: Commercial Managed Care - HMO

## 2016-01-24 DIAGNOSIS — D649 Anemia, unspecified: Secondary | ICD-10-CM | POA: Diagnosis not present

## 2016-01-24 DIAGNOSIS — Z79899 Other long term (current) drug therapy: Secondary | ICD-10-CM | POA: Diagnosis not present

## 2016-01-24 DIAGNOSIS — E871 Hypo-osmolality and hyponatremia: Secondary | ICD-10-CM | POA: Diagnosis not present

## 2016-01-24 DIAGNOSIS — D473 Essential (hemorrhagic) thrombocythemia: Secondary | ICD-10-CM | POA: Diagnosis not present

## 2016-01-24 DIAGNOSIS — E876 Hypokalemia: Secondary | ICD-10-CM | POA: Diagnosis not present

## 2016-01-24 DIAGNOSIS — G43909 Migraine, unspecified, not intractable, without status migrainosus: Secondary | ICD-10-CM | POA: Insufficient documentation

## 2016-01-24 DIAGNOSIS — E039 Hypothyroidism, unspecified: Secondary | ICD-10-CM | POA: Diagnosis not present

## 2016-01-24 DIAGNOSIS — Z7982 Long term (current) use of aspirin: Secondary | ICD-10-CM | POA: Diagnosis not present

## 2016-01-24 DIAGNOSIS — Z87891 Personal history of nicotine dependence: Secondary | ICD-10-CM | POA: Insufficient documentation

## 2016-01-24 DIAGNOSIS — D75839 Thrombocytosis, unspecified: Secondary | ICD-10-CM

## 2016-01-24 DIAGNOSIS — G43009 Migraine without aura, not intractable, without status migrainosus: Secondary | ICD-10-CM

## 2016-01-24 DIAGNOSIS — D7589 Other specified diseases of blood and blood-forming organs: Secondary | ICD-10-CM | POA: Insufficient documentation

## 2016-01-24 HISTORY — DX: Thrombocytosis, unspecified: D75.839

## 2016-01-24 LAB — IRON AND TIBC
Iron: 55 ug/dL (ref 28–170)
Saturation Ratios: 14 % (ref 10.4–31.8)
TIBC: 406 ug/dL (ref 250–450)
UIBC: 351 ug/dL

## 2016-01-24 LAB — RETICULOCYTES
RBC.: 4.04 MIL/uL (ref 3.87–5.11)
RETIC CT PCT: 1.5 % (ref 0.4–3.1)
Retic Count, Absolute: 60.6 10*3/uL (ref 19.0–186.0)

## 2016-01-24 LAB — CBC WITH DIFFERENTIAL/PLATELET
BASOS ABS: 0 10*3/uL (ref 0.0–0.1)
BASOS PCT: 0 %
EOS PCT: 2 %
Eosinophils Absolute: 0.2 10*3/uL (ref 0.0–0.7)
HCT: 36.2 % (ref 36.0–46.0)
Hemoglobin: 12.8 g/dL (ref 12.0–15.0)
Lymphocytes Relative: 14 %
Lymphs Abs: 1.5 10*3/uL (ref 0.7–4.0)
MCH: 31.7 pg (ref 26.0–34.0)
MCHC: 35.4 g/dL (ref 30.0–36.0)
MCV: 89.6 fL (ref 78.0–100.0)
MONO ABS: 1.1 10*3/uL — AB (ref 0.1–1.0)
Monocytes Relative: 10 %
Neutro Abs: 8.2 10*3/uL — ABNORMAL HIGH (ref 1.7–7.7)
Neutrophils Relative %: 74 %
PLATELETS: 401 10*3/uL — AB (ref 150–400)
RBC: 4.04 MIL/uL (ref 3.87–5.11)
RDW: 13.4 % (ref 11.5–15.5)
WBC: 10.9 10*3/uL — ABNORMAL HIGH (ref 4.0–10.5)

## 2016-01-24 LAB — COMPREHENSIVE METABOLIC PANEL
ALK PHOS: 156 U/L — AB (ref 38–126)
ALT: 49 U/L (ref 14–54)
AST: 23 U/L (ref 15–41)
Albumin: 4.4 g/dL (ref 3.5–5.0)
Anion gap: 10 (ref 5–15)
BUN: 14 mg/dL (ref 6–20)
CALCIUM: 9.6 mg/dL (ref 8.9–10.3)
CHLORIDE: 86 mmol/L — AB (ref 101–111)
CO2: 25 mmol/L (ref 22–32)
CREATININE: 1.02 mg/dL — AB (ref 0.44–1.00)
GFR, EST NON AFRICAN AMERICAN: 59 mL/min — AB (ref >60)
Glucose, Bld: 98 mg/dL (ref 65–99)
Potassium: 3.1 mmol/L — ABNORMAL LOW (ref 3.5–5.1)
SODIUM: 121 mmol/L — AB (ref 135–145)
Total Bilirubin: 0.5 mg/dL (ref 0.3–1.2)
Total Protein: 7.6 g/dL (ref 6.5–8.1)

## 2016-01-24 LAB — LACTATE DEHYDROGENASE: LDH: 116 U/L (ref 98–192)

## 2016-01-24 LAB — FOLATE: Folate: 5.2 ng/mL — ABNORMAL LOW (ref >5.9)

## 2016-01-24 LAB — FERRITIN: Ferritin: 57 ng/mL (ref 11–307)

## 2016-01-24 LAB — VITAMIN B12: VITAMIN B 12: 872 pg/mL (ref 180–914)

## 2016-01-24 LAB — SEDIMENTATION RATE: SED RATE: 9 mm/h (ref 0–22)

## 2016-01-24 NOTE — Progress Notes (Signed)
[place] NOTE  Patient Care Team: Redmond School, MD as PCP - General (Internal Medicine) Clifton James, MD as Referring Physician (Internal Medicine)  CHIEF COMPLAINTS/PURPOSE OF CONSULTATION:  Thrombocytosis  HISTORY OF PRESENTING ILLNESS:  Rita Stephens 60 y.o. female is here for consultation due to thrombocytosis.  Patient in unaccompanied today. She has elevated blood platelets and mild anemia. Platelets were normal in 2013. In 2016, platelets started to rise (445,000). Laboratory studies dated 01/14/2016 showed a platelet count of 501K, mild anemia with H/H of 11.4/33.3. Additional review of labs going back to 04/03/2011 show intermittent elevation of LFT's principally ALT up to 178 IU/L.   Pam reports feeling fatigued often. She denies pica or pagophagia.   Her last colonoscopy was done in [place] a few years ago (~3 years). Nothing abnormal was found. Last endoscopy was done years ago. She has a mammogram done every year and has never had an abnormal one.  Patient had a seizure last year and cause is unknown.   Pam reports having a poor appetite and states that she does not like to eat. She notes that this is chronic.  She has Takotsubo cardiomyopathy, EF 40-45%, and chronic migraines. She has hypothyroidism.   MEDICAL HISTORY:  Past Medical History:  Diagnosis Date  . Anxiety   . Chronic pain   . Essential hypertension   . History of cardiac catheterization    Normal coronaries November 2016  . History of pneumonia   . Hypothyroidism   . Migraine   . Ovarian cyst   . Spinal stenosis   . Takotsubo syndrome    November 2016    SURGICAL HISTORY: Past Surgical History:  Procedure Laterality Date  . CARDIAC CATHETERIZATION N/A 02/23/2015   Procedure: Left Heart Cath and Coronary Angiography;  Surgeon: Jettie Booze, MD;  Location: [facility];  Service: Cardiovascular;  Laterality: N/A;  . CHOLECYSTECTOMY    . DOPPLER  ECHOCARDIOGRAPHY    . GUM SURGERY     Removed cyst  . LEEP      SOCIAL HISTORY: Social History   Social History  . Marital status: Married    Spouse name: N/A  . Number of children: 1  . Years of education: HS   Occupational History  . Not on file.   Social History Main Topics  . Smoking status: Former Smoker    Packs/day: 0.75    Years: 30.00    Types: Cigarettes  . Smokeless tobacco: Never Used  . Alcohol use No  . Drug use: No  . Sexual activity: Not on file   Other Topics Concern  . Not on file   Social History Narrative   She lives with husband and daughter.  She is working as a Radiation protection practitioner.      Patient does not drink caffeine.   Patient is right handed.          Born in [place], [place]. Husband had leukemia but is cured. They have 2 children.  Doesn't work outside home She currently doesn't smoke, but is a former smoker (20 years- 1 pack a day) She does not drink alcohol She enjoys to read  FAMILY HISTORY: Family History  Problem Relation Age of Onset  . Hypertension Father   . Breast cancer Mother     metastatic to brain  . Migraines Sister   . Migraines Brother   . Heart attack Maternal Grandfather   . Seizures Neg Hx   Parents  deceased; Mom deceased at 6 years old. She died of breast cancer that mestasised to brain. Dad deceased at 57. Cause of death unknown. 1 brother, 2 half brothers, 1 sister alive, 1 sister that died of 35 years old Mathilde and all her siblings suffer from migraines.    ALLERGIES:  is allergic to amitriptyline; actifed cold-allergy [chlorpheniramine-phenyleph er]; sudafed [pseudoephedrine hcl]; and other.  MEDICATIONS:  Current Outpatient Prescriptions  Medication Sig Dispense Refill  . ALPRAZolam (XANAX) 1 MG tablet Take 1 mg by mouth 4 (four) times daily as needed for anxiety.     [name] Kitchen aspirin EC 81 MG EC tablet Take 1 tablet (81 mg total) by mouth daily.    [name] Kitchen atorvastatin (LIPITOR) 40 MG tablet Take 1 tablet (40 mg total)  by mouth daily at 6 PM. 30 tablet 11  . carvedilol (COREG) 6.25 MG tablet Take 1 tablet (6.25 mg total) by mouth 2 (two) times daily with a meal. 60 tablet 11  . Diclofenac Sodium (PENNSAID TD) Place 400 mg onto the skin 2 (two) times daily.    . DULoxetine (CYMBALTA) 30 MG capsule Take 30 mg by mouth daily. Take 30 mg capsule along with 60 mg capsule to equal 90 mg daily.    . DULoxetine (CYMBALTA) 60 MG capsule Take 60 mg by mouth daily. Take 60 mg capsule along with a 30 mg capsule to equal 90 mg daily.    . furosemide (LASIX) 20 MG tablet Take 20 mg by mouth daily as needed for fluid.    . hydrochlorothiazide (MICROZIDE) 12.5 MG capsule Take 12.5 mg by mouth daily.     [name] Kitchen ibuprofen (ADVIL,MOTRIN) 200 MG tablet Take 400-800 mg by mouth every 6 (six) hours as needed for moderate pain.    [name] Kitchen levothyroxine (SYNTHROID, LEVOTHROID) 88 MCG tablet Take 88 mcg by mouth daily before breakfast.    . Magnesium Salicylate (DOANS PILLS PO) Take 2 tablets by mouth daily as needed (pain).    . Multiple Vitamin (MULTIVITAMIN WITH MINERALS) TABS tablet Take 1 tablet by mouth daily.    [name] Kitchen olmesartan (BENICAR) 40 MG tablet Take 40 mg by mouth daily.    [name] Kitchen oxyCODONE (ROXICODONE) 15 MG immediate release tablet Take 15 mg by mouth every 4 (four) hours as needed for pain.    [name] Kitchen QUEtiapine (SEROQUEL) 50 MG tablet     . zonisamide (ZONEGRAN) 50 MG capsule Two capsule in the morning and 3 capsules in the evening 150 capsule 5   No current facility-administered medications for this visit.     Review of Systems  Constitutional: Positive for malaise/fatigue.       Poor appetite  HENT: Negative.   Eyes: Negative.   Respiratory: Negative.   Cardiovascular: Negative.   Gastrointestinal: Negative.   Genitourinary: Negative.   Musculoskeletal: Negative.   Skin: Negative.   Neurological: Negative.   Endo/Heme/Allergies: Negative.   Psychiatric/Behavioral: Negative.   All other systems reviewed and are negative. 14  point ROS was done and is otherwise as detailed above or in HPI   PHYSICAL EXAMINATION: ECOG PERFORMANCE STATUS: 0 - Asymptomatic  Vitals:   01/24/16 1434  BP: 124/76  Pulse: 96  Resp: 18  Temp: 98.8 F (37.1 C)   Filed Weights   01/24/16 1434  Weight: 168 lb 12.8 oz (76.6 kg)     Physical Exam  Constitutional: She is oriented to person, place, and time and well-developed, well-nourished, and in no distress.  HENT:  Head: Normocephalic and atraumatic.  Nose:  Nose normal.  Mouth/Throat: Oropharynx is clear and moist. No oropharyngeal exudate.  Eyes: Conjunctivae and EOM are normal. Pupils are equal, round, and reactive to light. Right eye exhibits no discharge. Left eye exhibits no discharge. No scleral icterus.  Neck: Normal range of motion. Neck supple. No tracheal deviation present. No thyromegaly present.  Cardiovascular: Normal rate, regular rhythm and normal heart sounds.  Exam reveals no gallop and no friction rub.   No murmur heard. Pulmonary/Chest: Effort normal and breath sounds normal. She has no wheezes. She has no rales.  Abdominal: Soft. Bowel sounds are normal. She exhibits no distension and no mass. There is no tenderness. There is no rebound and no guarding.  Musculoskeletal: Normal range of motion. She exhibits no edema.  Lymphadenopathy:    She has no cervical adenopathy.  Neurological: She is alert and oriented to person, place, and time. She has normal reflexes. No cranial nerve deficit. Gait normal. Coordination normal.  Skin: Skin is warm and dry. No rash noted.  Psychiatric: Mood, memory, affect and judgment normal.  Nursing note and vitals reviewed.   LABORATORY DATA:  I have reviewed the data as listed Lab Results  Component Value Date   WBC 10.9 (H) 01/24/2016   HGB 12.8 01/24/2016   HCT 36.2 01/24/2016   MCV 89.6 01/24/2016   PLT 401 (H) 01/24/2016   CMP     Component Value Date/Time   NA 121 (L) 01/24/2016 1528   K 3.1 (L) 01/24/2016  1528   CL 86 (L) 01/24/2016 1528   CO2 25 01/24/2016 1528   GLUCOSE 98 01/24/2016 1528   BUN 14 01/24/2016 1528   CREATININE 1.02 (H) 01/24/2016 1528   CALCIUM 9.6 01/24/2016 1528   PROT 7.6 01/24/2016 1528   ALBUMIN 4.4 01/24/2016 1528   AST 23 01/24/2016 1528   ALT 49 01/24/2016 1528   ALKPHOS 156 (H) 01/24/2016 1528   BILITOT 0.5 01/24/2016 1528   GFRNONAA 59 (L) 01/24/2016 1528   GFRAA >60 01/24/2016 1528     RADIOGRAPHIC STUDIES: I have personally reviewed the radiological images as listed and agreed with the findings in the report. No results found.  ASSESSMENT & PLAN:  Thrombocytosis Anemia History elevated LFT's Hyponatremia Hypokalemia   I discussed causes of thrombocytosis with the patient. The initial diagnostic question is whether thrombocytosis is a reactive phenomenon or a marker for the presence of a hematologic disorder Causes of reactive thrombocytosis include iron deficiency, B12 deficiency, malignancy, rheumatologic disorders, chronic infection, allergies, reaction to medications. Etc.  If a reactive process has been ruled out, the next step is to accurately classify the thrombocytosis as being due to one of the defined myeloproliferative or myelodysplastic disorders. In general, autonomous thrombocytosis (AT) is a reasonable diagnostic possibility in a patient with chronic thrombocytosis, normal iron stores, and an intact spleen.  I advised her that it is recommended that in all patients in whom a reactive process cannot be identified a bone marrow examination with reticulin staining and cytogenetic studies would be recommended  There are no laboratory findings which are pathognomonic for ET. Thus, ET is the working diagnosis when autonomous or clonal thrombocytosis occurs in the absence of the diagnostic features noted above  Diagnostic criteria for ET proposed by the [facility]  requires a sustained platelet count of over 450,000/microL . The rate of cytogenetic  abnormalities is less than 5 percent, although the JAK2 V617F mutation is seen in approximately 50 percent of patients with ET.  Will do genetic  testing: JAK2, MPL, CALR  She has evidence of mild anemia on her blood work as well. An anemia panel will be performed today to also include labs detailed below.  She does not have known liver disease, cause of intermittent elevation of LFT's currently unknown. If elevated today on CMP will pursue further evaluation.  She will return in 2 weeks to review.  ORDERS PLACED FOR THIS ENCOUNTER: Orders Placed This Encounter  Procedures  . JAK2 V617F, Rfx CALR/E12/MPL  . CBC with Differential  . Pathologist smear review  . Lactate dehydrogenase  . Comprehensive metabolic panel  . Sedimentation rate  . IgG, IgA, IgM  . Vitamin B12  . Ferritin  . Iron and TIBC  . Folate  . Immunofixation electrophoresis  . Protein electrophoresis, serum  . Haptoglobin  . Kappa/lambda light chains  . Reticulocytes    Will repeat CMP early next week. She has evidence of chronic hyponatremia. If persistent will defer back to her PCP.   All questions were answered. The patient knows to call the clinic with any problems, questions or concerns.  This document serves as a record of services personally performed by Ancil Linsey, MD. It was created on her behalf by Elmyra Ricks, a trained medical scribe. The creation of this record is based on the scribe's personal observations and the provider's statements to them. This document has been checked and approved by the attending provider.  I have reviewed the above documentation for accuracy and completeness and I agree with the above.  This note was electronically signed.    Molli Hazard, MD  01/27/2016 2:49 PM

## 2016-01-24 NOTE — Patient Instructions (Signed)
[place] at [facility] Discharge Instructions  RECOMMENDATIONS MADE BY THE CONSULTANT AND ANY TEST RESULTS WILL BE SENT TO YOUR REFERRING PHYSICIAN.  Please go to the lab as you leave today for labwork.  We will call you with results. You will return to [place] in two weeks.    Thank you for choosing [place] at [facility] to provide your oncology and hematology care.  To afford each patient quality time with our provider, please arrive at least 15 minutes before your scheduled appointment time.   Beginning January 23rd 2017 lab work for the [facility] will be done in the  Main lab at [facility] on 1st floor. If you have a lab appointment with the [facility] please come in thru the  Main Entrance and check in at the main information desk  You need to re-schedule your appointment should you arrive 10 or more minutes late.  We strive to give you quality time with our providers, and arriving late affects you and other patients whose appointments are after yours.  Also, if you no show three or more times for appointments you may be dismissed from the clinic at the providers discretion.     Again, thank you for choosing [facility].  Our hope is that these requests will decrease the amount of time that you wait before being seen by our physicians.       _____________________________________________________________  Should you have questions after your visit to [facility], please contact our office at [phone] between the hours of 8:30 a.m. and 4:30 p.m.  Voicemails left after 4:30 p.m. will not be returned until the following business day.  For prescription refill requests, have your pharmacy contact our office.         Resources For Cancer Patients and their Caregivers ? American Cancer Society: Can assist with transportation, wigs, general needs, runs Look Good Feel Better.        612-759-0104 ? Cancer  Care: Provides financial assistance, online support groups, medication/co-pay assistance.  1-800-813-HOPE 7187106051) ? [facility] Assists [place] cancer patients and their families through emotional , educational and financial support.  410-461-1143 ? [place] DSS Where to apply for food stamps, Medicaid and utility assistance. 930-324-7022 ? [facility]: Transportation to medical appointments. (412)832-5888 ? Social Security Administration: May apply for disability if have a Stage IV cancer. 609-417-8455 (305) 461-9159 ? [employer], Disability and Transit Services: Assists with nutrition, care and transit needs. [place] Support Programs: @10RELATIVEDAYS @ > Cancer Support Group  2nd Tuesday of the month 1pm-2pm, Journey Room  > Creative Journey  3rd Tuesday of the month 1130am-1pm, Journey Room  > Look Good Feel Better  1st Wednesday of the month 10am-12 noon, Journey Room (Call [place] to register [phone])

## 2016-01-25 LAB — KAPPA/LAMBDA LIGHT CHAINS
KAPPA FREE LGHT CHN: 12.7 mg/L (ref 3.3–19.4)
Kappa, lambda light chain ratio: 1.11 (ref 0.26–1.65)
LAMDA FREE LIGHT CHAINS: 11.4 mg/L (ref 5.7–26.3)

## 2016-01-25 LAB — IGG, IGA, IGM
IGA: 123 mg/dL (ref 87–352)
IgG (Immunoglobin G), Serum: 909 mg/dL (ref 700–1600)
IgM, Serum: 15 mg/dL — ABNORMAL LOW (ref 26–217)

## 2016-01-25 LAB — PROTEIN ELECTROPHORESIS, SERUM
A/G RATIO SPE: 1.3 (ref 0.7–1.7)
ALBUMIN ELP: 3.9 g/dL (ref 2.9–4.4)
Alpha-1-Globulin: 0.3 g/dL (ref 0.0–0.4)
Alpha-2-Globulin: 0.9 g/dL (ref 0.4–1.0)
Beta Globulin: 1.1 g/dL (ref 0.7–1.3)
Gamma Globulin: 0.8 g/dL (ref 0.4–1.8)
Globulin, Total: 3.1 g/dL (ref 2.2–3.9)
Total Protein ELP: 7 g/dL (ref 6.0–8.5)

## 2016-01-25 LAB — HAPTOGLOBIN: HAPTOGLOBIN: 199 mg/dL (ref 34–200)

## 2016-01-27 ENCOUNTER — Encounter (HOSPITAL_COMMUNITY): Payer: Self-pay | Admitting: Hematology & Oncology

## 2016-01-27 DIAGNOSIS — E871 Hypo-osmolality and hyponatremia: Secondary | ICD-10-CM | POA: Insufficient documentation

## 2016-01-28 ENCOUNTER — Telehealth (HOSPITAL_COMMUNITY): Payer: Self-pay | Admitting: *Deleted

## 2016-01-28 DIAGNOSIS — E871 Hypo-osmolality and hyponatremia: Secondary | ICD-10-CM

## 2016-01-28 DIAGNOSIS — E876 Hypokalemia: Secondary | ICD-10-CM

## 2016-01-28 LAB — IMMUNOFIXATION ELECTROPHORESIS
IGA: 120 mg/dL (ref 87–352)
IGG (IMMUNOGLOBIN G), SERUM: 919 mg/dL (ref 700–1600)
IgM, Serum: 15 mg/dL — ABNORMAL LOW (ref 26–217)
Total Protein ELP: 7.1 g/dL (ref 6.0–8.5)

## 2016-01-28 NOTE — Telephone Encounter (Signed)
-----   Message from Patrici Ranks, MD sent at 01/27/2016  3:43 PM EDT -----  Have patient return Monday or Tuesday for CMP. Her sodium and potassium are low. Dr.P

## 2016-01-28 NOTE — Telephone Encounter (Signed)
Pt aware that appointment made for repeat labs.

## 2016-01-29 ENCOUNTER — Other Ambulatory Visit (HOSPITAL_COMMUNITY): Payer: Commercial Managed Care - HMO

## 2016-01-31 ENCOUNTER — Other Ambulatory Visit (HOSPITAL_COMMUNITY): Payer: Self-pay | Admitting: *Deleted

## 2016-01-31 LAB — CALR + JAK2 E12-15 + MPL (REFLEXED)

## 2016-01-31 LAB — JAK2 V617F, W REFLEX TO CALR/E12/MPL

## 2016-01-31 LAB — PATHOLOGIST SMEAR REVIEW

## 2016-01-31 MED ORDER — FOLIC ACID 1 MG PO TABS: 1.0000 mg | ORAL_TABLET | Freq: Every day | ORAL | 2 refills | Status: DC

## 2016-02-01 ENCOUNTER — Other Ambulatory Visit: Payer: Self-pay | Admitting: Adult Health

## 2016-02-04 ENCOUNTER — Telehealth: Payer: Self-pay | Admitting: Neurology

## 2016-02-04 NOTE — Telephone Encounter (Signed)
Sent refill to pt pharmacy as requested electronically.

## 2016-02-04 NOTE — Telephone Encounter (Signed)
[facility] had a break in this morning and need new rx for zonisamide (ZONEGRAN) 50 MG capsule. Pt needs refill.

## 2016-02-06 ENCOUNTER — Other Ambulatory Visit (HOSPITAL_COMMUNITY): Payer: Commercial Managed Care - HMO

## 2016-02-08 ENCOUNTER — Ambulatory Visit (HOSPITAL_COMMUNITY): Payer: Commercial Managed Care - HMO | Admitting: Hematology & Oncology

## 2016-02-14 ENCOUNTER — Ambulatory Visit (INDEPENDENT_AMBULATORY_CARE_PROVIDER_SITE_OTHER): Payer: Commercial Managed Care - HMO | Admitting: Adult Health

## 2016-02-14 ENCOUNTER — Encounter: Payer: Self-pay | Admitting: Adult Health

## 2016-02-14 VITALS — BP 80/56 | HR 78 | Resp 18 | Ht 60.75 in | Wt 171.5 lb

## 2016-02-14 DIAGNOSIS — R569 Unspecified convulsions: Secondary | ICD-10-CM | POA: Diagnosis not present

## 2016-02-14 DIAGNOSIS — G43009 Migraine without aura, not intractable, without status migrainosus: Secondary | ICD-10-CM | POA: Diagnosis not present

## 2016-02-14 MED ORDER — ZONISAMIDE 50 MG PO CAPS: ORAL_CAPSULE | ORAL | 11 refills | Status: DC

## 2016-02-14 NOTE — Progress Notes (Signed)
PATIENT: Rita Stephens DOB: [birth year]  REASON FOR VISIT: follow up HISTORY FROM: patient  HISTORY OF PRESENT ILLNESS: Rita Stephens is a 60 year old female with a history of migraine headaches and seizures. She returns today for follow-up. She is currently taken Zonegran 100 mg in the morning and 150 mg in the evening.. She denies any seizure events. She feels that her headaches are less frequent. She currently has 1-2  Headaches a month. She is able to complete all ADLs independentely. She does not drive. She reports that her headaches are typically located around the eyes. She does have photophobia and phonophobia but denies nausea and vomiting. She denies any new neurological symptoms. She does report that her blood work indicated that her platelets and hemoglobin was low she's been followed by Rita Stephens for additional blood work. She returns today for an evaluation.   HISTORY 07/10/15: Rita Stephens is a 60 year old female with a history of migraine headaches and seizure events. She returns today for follow-up. She is currently taken Zonegran 100 mg twice a day. She reports that she is not had any additional seizure events. She is able to complete all ADLs independentely. She does not operate a motor vehicle at this time. The patient states that she continues to have frequent headaches. She has at least 6-8 migraines a month. Her headaches are normally located around the eyes. She states that recently she's been having more sinus headaches. She reports with her headache she does have photophobia and phonophobia. She states that the nausea and vomiting has resolved. She states that typically when she gets a headache she has to lay down in order for it to resolve. She denies any new neurological symptoms. She returns today for an evaluation.  HISTORY 03/12/15 (WILLIS):  Rita Stephens is a 29 year old right-handed white female with a history of an admission to the hospital that occurred on 02/22/2015. The  patient had an event at home that was felt related to a seizure event. The patient was looking at her answering machine, she saw the number 2, when she looked up, she could see multiple 2's throughout. She tried to talk, but went into a staring event, and then started to fall, with an associated generalized jerking episode. EMS was called, it took the patient about 15 or 20 minutes to become oriented to the people around her. The patient went to the hospital, and MRI brain evaluation showed some degree of chronic small vessel disease. The patient was found have a myocardial infarction, but no evidence of coronary artery disease. The patient had a mild cardiomyopathy, felt secondary to a Takotsubo cardiomyopathy. The patient has been placed on Depakote given the history of the seizure and migraine headache. She comes to this office for an evaluation. She has not had any further episodes. She is currently not operating a motor vehicle. She indicates that she is not tolerating the medication well secondary to emotional lability, depression. The patient takes Xanax 2 mg at night, she indicates that she did not miss a dose prior to the seizure.   REVIEW OF SYSTEMS: Out of a complete 14 system review of symptoms, the patient complains only of the following symptoms, and all other reviewed systems are negative.  Bruise/bleed easily  ALLERGIES: Allergies  Allergen Reactions  . Amitriptyline Itching    Allergy identified by daughter  . Actifed Cold-Allergy [Chlorpheniramine-Phenyleph Er] Other (See Comments)    ALTERS MENTAL STATUS  . Sudafed [Pseudoephedrine Hcl] Other (See Comments)  Climbs walls  . Other Palpitations    Topamax    HOME MEDICATIONS: Outpatient Medications Prior to Visit  Medication Sig Dispense Refill  . ALPRAZolam (XANAX) 1 MG tablet Take 1 mg by mouth 4 (four) times daily as needed for anxiety.     [name] Kitchen aspirin EC 81 MG EC tablet Take 1 tablet (81 mg total) by mouth daily.    [name] Kitchen  atorvastatin (LIPITOR) 40 MG tablet Take 1 tablet (40 mg total) by mouth daily at 6 PM. 30 tablet 11  . carvedilol (COREG) 6.25 MG tablet Take 1 tablet (6.25 mg total) by mouth 2 (two) times daily with a meal. 60 tablet 11  . Diclofenac Sodium (PENNSAID TD) Place 400 mg onto the skin 2 (two) times daily.    . DULoxetine (CYMBALTA) 30 MG capsule Take 30 mg by mouth daily. Take 30 mg capsule along with 60 mg capsule to equal 90 mg daily.    . DULoxetine (CYMBALTA) 60 MG capsule Take 60 mg by mouth daily. Take 60 mg capsule along with a 30 mg capsule to equal 90 mg daily.    . folic acid (FOLVITE) 1 MG tablet Take 1 tablet (1 mg total) by mouth daily. 30 tablet 2  . furosemide (LASIX) 20 MG tablet Take 20 mg by mouth daily as needed for fluid.    . hydrochlorothiazide (MICROZIDE) 12.5 MG capsule Take 12.5 mg by mouth daily.     [name] Kitchen ibuprofen (ADVIL,MOTRIN) 200 MG tablet Take 400-800 mg by mouth every 6 (six) hours as needed for moderate pain.    [name] Kitchen levothyroxine (SYNTHROID, LEVOTHROID) 88 MCG tablet Take 88 mcg by mouth daily before breakfast.    . Magnesium Salicylate (DOANS PILLS PO) Take 2 tablets by mouth daily as needed (pain).    . Multiple Vitamin (MULTIVITAMIN WITH MINERALS) TABS tablet Take 1 tablet by mouth daily.    [name] Kitchen olmesartan (BENICAR) 40 MG tablet Take 40 mg by mouth daily.    [name] Kitchen oxyCODONE (ROXICODONE) 15 MG immediate release tablet Take 15 mg by mouth every 4 (four) hours as needed for pain.    [name] Kitchen QUEtiapine (SEROQUEL) 50 MG tablet     . zonisamide (ZONEGRAN) 50 MG capsule TAKE 2 CAPSULES IN THE MORNING AND 3 CAPSULES IN THE EVENING 150 capsule 0   No facility-administered medications prior to visit.     PAST MEDICAL HISTORY: Past Medical History:  Diagnosis Date  . Anxiety   . Chronic pain   . Essential hypertension   . History of cardiac catheterization    Normal coronaries November 2016  . History of pneumonia   . Hypothyroidism   . Migraine   . Ovarian cyst   . Spinal  stenosis   . Takotsubo syndrome    November 2016    PAST SURGICAL HISTORY: Past Surgical History:  Procedure Laterality Date  . CARDIAC CATHETERIZATION N/A 02/23/2015   Procedure: Left Heart Cath and Coronary Angiography;  Surgeon: Jettie Booze, MD;  Location: [facility];  Service: Cardiovascular;  Laterality: N/A;  . CHOLECYSTECTOMY    . DOPPLER ECHOCARDIOGRAPHY    . GUM SURGERY     Removed cyst  . LEEP      FAMILY HISTORY: Family History  Problem Relation Age of Onset  . Hypertension Father   . Breast cancer Mother     metastatic to brain  . Migraines Sister   . Migraines Brother   . Heart attack Maternal Grandfather   . Seizures Neg Hx  SOCIAL HISTORY: Social History   Social History  . Marital status: Married    Spouse name: N/A  . Number of children: 1  . Years of education: HS   Occupational History  . Not on file.   Social History Main Topics  . Smoking status: Former Smoker    Packs/day: 0.75    Years: 30.00    Types: Cigarettes  . Smokeless tobacco: Never Used  . Alcohol use No  . Drug use: No  . Sexual activity: Not on file   Other Topics Concern  . Not on file   Social History Narrative   She lives with husband and daughter.  She is working as a Radiation protection practitioner.      Patient does not drink caffeine.   Patient is right handed.             PHYSICAL EXAM  Vitals:   02/14/16 1436  BP: (!) 80/56  Pulse: 78  Resp: 18  Weight: 171 lb 8 oz (77.8 kg)  Height: 5' 0.75" (1.543 m)   Body mass index is 32.67 kg/m.  Generalized: Well developed, in no acute distress   Neurological examination  Mentation: Alert oriented to time, place, history taking. Follows all commands speech and language fluent Cranial nerve II-XII: Pupils were equal round reactive to light. Extraocular movements were full, visual field were full on confrontational test. Facial sensation and strength were normal. Uvula tongue midline. Head turning and  shoulder shrug  were normal and symmetric. Motor: The motor testing reveals 5 over 5 strength of all 4 extremities. Good symmetric motor tone is noted throughout.  Sensory: Sensory testing is intact to soft touch on all 4 extremities. No evidence of extinction is noted.  Coordination: Cerebellar testing reveals good finger-nose-finger and heel-to-shin bilaterally.  Gait and station: Gait is normal. Tandem gait is unsteady. Romberg is negative. No drift is seen.  Reflexes: Deep tendon reflexes are symmetric and normal bilaterally.   DIAGNOSTIC DATA (LABS, IMAGING, TESTING) - I reviewed patient records, labs, notes, testing and imaging myself where available.  Lab Results  Component Value Date   WBC 10.9 (H) 01/24/2016   HGB 12.8 01/24/2016   HCT 36.2 01/24/2016   MCV 89.6 01/24/2016   PLT 401 (H) 01/24/2016      Component Value Date/Time   NA 121 (L) 01/24/2016 1528   K 3.1 (L) 01/24/2016 1528   CL 86 (L) 01/24/2016 1528   CO2 25 01/24/2016 1528   GLUCOSE 98 01/24/2016 1528   BUN 14 01/24/2016 1528   CREATININE 1.02 (H) 01/24/2016 1528   CALCIUM 9.6 01/24/2016 1528   PROT 7.6 01/24/2016 1528   ALBUMIN 4.4 01/24/2016 1528   AST 23 01/24/2016 1528   ALT 49 01/24/2016 1528   ALKPHOS 156 (H) 01/24/2016 1528   BILITOT 0.5 01/24/2016 1528   GFRNONAA 59 (L) 01/24/2016 1528   GFRAA >60 01/24/2016 1528        ASSESSMENT AND PLAN 60 y.o. year old female  has a past medical history of Anxiety; Chronic pain; Essential hypertension; History of cardiac catheterization; History of pneumonia; Hypothyroidism; Migraine; Ovarian cyst; Spinal stenosis; and Takotsubo syndrome. here with:  1. Migraine headaches 2. Seizures  Overall the patient has been doing well. She will continue on Zonegran 200 mg in the morning and 150 mg in the evening. Advised that if her headaches worsen or she develops any new symptoms she should let us know. Of course if she has any seizure events she should  make [place]  aware. She will follow-up in 6 months with Dr. Jannifer Franklin or sooner if needed.     Ward Givens, MSN, NP-C 02/14/2016, 2:37 PM [facility] [address], [place], [place] [address] [phone]

## 2016-02-14 NOTE — Progress Notes (Signed)
I have read the note, and I agree with the clinical assessment and plan.  Amaury Kuzel KEITH   

## 2016-02-14 NOTE — Patient Instructions (Signed)
Continue Zonegran If your symptoms worsen or you develop new symptoms please let us know.

## 2016-02-26 ENCOUNTER — Encounter (HOSPITAL_COMMUNITY): Payer: Commercial Managed Care - HMO

## 2016-02-26 ENCOUNTER — Encounter (HOSPITAL_COMMUNITY): Payer: Commercial Managed Care - HMO | Attending: Hematology & Oncology | Admitting: Hematology & Oncology

## 2016-02-26 ENCOUNTER — Other Ambulatory Visit (HOSPITAL_COMMUNITY): Payer: Self-pay

## 2016-02-26 ENCOUNTER — Encounter (HOSPITAL_COMMUNITY): Payer: Self-pay | Admitting: Hematology & Oncology

## 2016-02-26 VITALS — BP 97/60 | HR 114 | Temp 98.9°F | Resp 18 | Wt 164.0 lb

## 2016-02-26 DIAGNOSIS — D72828 Other elevated white blood cell count: Secondary | ICD-10-CM

## 2016-02-26 DIAGNOSIS — D729 Disorder of white blood cells, unspecified: Secondary | ICD-10-CM

## 2016-02-26 DIAGNOSIS — E871 Hypo-osmolality and hyponatremia: Secondary | ICD-10-CM

## 2016-02-26 DIAGNOSIS — D473 Essential (hemorrhagic) thrombocythemia: Secondary | ICD-10-CM | POA: Insufficient documentation

## 2016-02-26 DIAGNOSIS — D75839 Thrombocytosis, unspecified: Secondary | ICD-10-CM

## 2016-02-26 DIAGNOSIS — D72829 Elevated white blood cell count, unspecified: Secondary | ICD-10-CM

## 2016-02-26 DIAGNOSIS — E876 Hypokalemia: Secondary | ICD-10-CM

## 2016-02-26 LAB — COMPREHENSIVE METABOLIC PANEL
ALK PHOS: 225 U/L — AB (ref 38–126)
ALT: 95 U/L — AB (ref 14–54)
AST: 63 U/L — AB (ref 15–41)
Albumin: 3.8 g/dL (ref 3.5–5.0)
Anion gap: 9 (ref 5–15)
BUN: 12 mg/dL (ref 6–20)
CALCIUM: 9.1 mg/dL (ref 8.9–10.3)
CHLORIDE: 100 mmol/L — AB (ref 101–111)
CO2: 25 mmol/L (ref 22–32)
CREATININE: 1.4 mg/dL — AB (ref 0.44–1.00)
GFR calc Af Amer: 47 mL/min — ABNORMAL LOW (ref >60)
GFR calc non Af Amer: 40 mL/min — ABNORMAL LOW (ref >60)
Glucose, Bld: 109 mg/dL — ABNORMAL HIGH (ref 65–99)
Potassium: 3.1 mmol/L — ABNORMAL LOW (ref 3.5–5.1)
SODIUM: 134 mmol/L — AB (ref 135–145)
Total Bilirubin: 0.8 mg/dL (ref 0.3–1.2)
Total Protein: 6.8 g/dL (ref 6.5–8.1)

## 2016-02-26 MED ORDER — POTASSIUM CHLORIDE ER 10 MEQ PO TBCR: 10.0000 meq | EXTENDED_RELEASE_TABLET | Freq: Two times a day (BID) | ORAL | 1 refills | Status: DC

## 2016-02-26 NOTE — Progress Notes (Signed)
[facility] NOTE  Patient Care Team: Redmond School, MD as PCP - General (Internal Medicine) Clifton James, MD as Referring Physician (Internal Medicine)  CHIEF COMPLAINTS/PURPOSE OF CONSULTATION:  Thrombocytosis  HISTORY OF PRESENTING ILLNESS:  Rita Stephens 60 y.o. female is here for a follow up of thrombocytosis. On review of labs was also noted to have a chronic leukocytosis. Labs showed a hyponatremia and hypokalemia as well.  Patient is accompanied by husband.  Anamae's cardiologist is Dr. Domenic Polite.   Patient has been well, aside from some fatigue and back pain. She stays on the couch because she experiences so much back pain. Monna was told there is nothing that can be done about her back. This is her major concern today.  She notes her appetite is fine. No bleeding or bruising.  She is here to review labs from her last visit.  She denies abdominal pain.  MEDICAL HISTORY:  Past Medical History:  Diagnosis Date  . Anxiety   . Chronic pain   . Essential hypertension   . History of cardiac catheterization    Normal coronaries November 2016  . History of pneumonia   . Hypothyroidism   . Migraine   . Ovarian cyst   . Spinal stenosis   . Takotsubo syndrome    November 2016    SURGICAL HISTORY: Past Surgical History:  Procedure Laterality Date  . CARDIAC CATHETERIZATION N/A 02/23/2015   Procedure: Left Heart Cath and Coronary Angiography;  Surgeon: Jettie Booze, MD;  Location: [facility];  Service: Cardiovascular;  Laterality: N/A;  . CHOLECYSTECTOMY    . DOPPLER ECHOCARDIOGRAPHY    . GUM SURGERY     Removed cyst  . LEEP      SOCIAL HISTORY: Social History   Social History  . Marital status: Married    Spouse name: N/A  . Number of children: 1  . Years of education: HS   Occupational History  . Not on file.   Social History Main Topics  . Smoking status: Former Smoker    Packs/day: 0.75    Years: 30.00   Types: Cigarettes  . Smokeless tobacco: Never Used  . Alcohol use No  . Drug use: No  . Sexual activity: Not on file   Other Topics Concern  . Not on file   Social History Narrative   She lives with husband and daughter.  She is working as a Radiation protection practitioner.      Patient does not drink caffeine.   Patient is right handed.          Born in [place], [place]. Husband had leukemia but is cured. They have 2 children.  Doesn't work outside home She currently doesn't smoke, but is a former smoker (20 years- 1 pack a day) She does not drink alcohol She enjoys to read  FAMILY HISTORY: Family History  Problem Relation Age of Onset  . Hypertension Father   . Breast cancer Mother     metastatic to brain  . Migraines Sister   . Migraines Brother   . Heart attack Maternal Grandfather   . Seizures Neg Hx    Parents deceased; Mom deceased at 29 years old. She died of breast cancer that mestasised to brain. Dad deceased at 14. Cause of death unknown. 1 brother, 2 half brothers, 1 sister alive, 1 sister that died of 45 years old Reesha and all her siblings suffer from migraines.    ALLERGIES:  is allergic to  amitriptyline; actifed cold-allergy [chlorpheniramine-phenyleph er]; sudafed [pseudoephedrine hcl]; and other.  MEDICATIONS:  Current Outpatient Prescriptions  Medication Sig Dispense Refill  . ALPRAZolam (XANAX) 1 MG tablet Take 1 mg by mouth 4 (four) times daily as needed for anxiety.     [name] Kitchen aspirin EC 81 MG EC tablet Take 1 tablet (81 mg total) by mouth daily.    [name] Kitchen atorvastatin (LIPITOR) 40 MG tablet Take 1 tablet (40 mg total) by mouth daily at 6 PM. 30 tablet 11  . carvedilol (COREG) 6.25 MG tablet Take 1 tablet (6.25 mg total) by mouth 2 (two) times daily with a meal. 60 tablet 11  . Diclofenac Sodium (PENNSAID TD) Place 400 mg onto the skin 2 (two) times daily.    . DULoxetine (CYMBALTA) 30 MG capsule Take 30 mg by mouth daily. Take 30 mg capsule along with 60 mg capsule to equal 90  mg daily.    . DULoxetine (CYMBALTA) 60 MG capsule Take 60 mg by mouth daily. Take 60 mg capsule along with a 30 mg capsule to equal 90 mg daily.    . folic acid (FOLVITE) 1 MG tablet Take 1 tablet (1 mg total) by mouth daily. 30 tablet 2  . furosemide (LASIX) 20 MG tablet Take 20 mg by mouth daily as needed for fluid.    . hydrochlorothiazide (MICROZIDE) 12.5 MG capsule Take 12.5 mg by mouth daily.     [name] Kitchen ibuprofen (ADVIL,MOTRIN) 200 MG tablet Take 400-800 mg by mouth every 6 (six) hours as needed for moderate pain.    [name] Kitchen levothyroxine (SYNTHROID, LEVOTHROID) 88 MCG tablet Take 88 mcg by mouth daily before breakfast.    . Magnesium Salicylate (DOANS PILLS PO) Take 2 tablets by mouth daily as needed (pain).    . Multiple Vitamin (MULTIVITAMIN WITH MINERALS) TABS tablet Take 1 tablet by mouth daily.    [name] Kitchen olmesartan (BENICAR) 40 MG tablet Take 40 mg by mouth daily.    [name] Kitchen oxyCODONE (ROXICODONE) 15 MG immediate release tablet Take 15 mg by mouth every 4 (four) hours as needed for pain.    [name] Kitchen QUEtiapine (SEROQUEL) 50 MG tablet     . zonisamide (ZONEGRAN) 50 MG capsule TAKE 2 CAPSULES IN THE MORNING AND 3 CAPSULES IN THE EVENING 150 capsule 11   No current facility-administered medications for this visit.     Review of Systems  Constitutional: Positive for malaise/fatigue.       Poor appetite  HENT: Negative.   Eyes: Negative.   Respiratory: Negative.   Cardiovascular: Negative.   Gastrointestinal: Negative.   Genitourinary: Negative.   Musculoskeletal: Positive for back pain.  Skin: Negative.   Neurological: Negative.   Endo/Heme/Allergies: Negative.   Psychiatric/Behavioral: Negative.   All other systems reviewed and are negative. 14 point ROS was done and is otherwise as detailed above or in HPI  PHYSICAL EXAMINATION: ECOG PERFORMANCE STATUS: 0 - Asymptomatic  Vitals:   02/26/16 1200  BP: 97/60  Pulse: (!) 114  Resp: 18  Temp: 98.9 F (37.2 C)   Filed Weights   02/26/16 1200    Weight: 164 lb (74.4 kg)     Physical Exam  Constitutional: She is oriented to person, place, and time and well-developed, well-nourished, and in no distress.  HENT:  Head: Normocephalic and atraumatic.  Nose: Nose normal.  Mouth/Throat: Oropharynx is clear and moist. No oropharyngeal exudate.  Eyes: Conjunctivae and EOM are normal. Pupils are equal, round, and reactive to light. Right eye exhibits no discharge. Left eye  exhibits no discharge. No scleral icterus.  Neck: Normal range of motion. Neck supple. No tracheal deviation present. No thyromegaly present.  Cardiovascular: Normal rate, regular rhythm and normal heart sounds.  Exam reveals no gallop and no friction rub.   No murmur heard. Pulmonary/Chest: Effort normal and breath sounds normal. She has no wheezes. She has no rales.  Abdominal: Soft. Bowel sounds are normal. She exhibits no distension and no mass. There is no tenderness. There is no rebound and no guarding.  Musculoskeletal: Normal range of motion. She exhibits no edema.  Lymphadenopathy:    She has no cervical adenopathy.  Neurological: She is alert and oriented to person, place, and time. She has normal reflexes. No cranial nerve deficit. Gait normal. Coordination normal.  Skin: Skin is warm and dry. No rash noted.  Psychiatric: Mood, memory, affect and judgment normal.  Nursing note and vitals reviewed.   LABORATORY DATA:  I have reviewed the data as listed Lab Results  Component Value Date   WBC 10.9 (H) 01/24/2016   HGB 12.8 01/24/2016   HCT 36.2 01/24/2016   MCV 89.6 01/24/2016   PLT 401 (H) 01/24/2016   CMP     Component Value Date/Time   NA 134 (L) 02/26/2016 1147   K 3.1 (L) 02/26/2016 1147   CL 100 (L) 02/26/2016 1147   CO2 25 02/26/2016 1147   GLUCOSE 109 (H) 02/26/2016 1147   BUN 12 02/26/2016 1147   CREATININE 1.40 (H) 02/26/2016 1147   CALCIUM 9.1 02/26/2016 1147   PROT 6.8 02/26/2016 1147   ALBUMIN 3.8 02/26/2016 1147   AST 63 (H)  02/26/2016 1147   ALT 95 (H) 02/26/2016 1147   ALKPHOS 225 (H) 02/26/2016 1147   BILITOT 0.8 02/26/2016 1147   GFRNONAA 40 (L) 02/26/2016 1147   GFRAA 47 (L) 02/26/2016 1147    Results for CONTINA, STRAIN (MRN [id number]) as of 02/27/2016 16:41  Ref. Range 01/24/2016 15:28 01/24/2016 15:28  Sodium Latest Ref Range: 135 - 145 mmol/L 121 (L)   Potassium Latest Ref Range: 3.5 - 5.1 mmol/L 3.1 (L)   Chloride Latest Ref Range: 101 - 111 mmol/L 86 (L)   CO2 Latest Ref Range: 22 - 32 mmol/L 25   BUN Latest Ref Range: 6 - 20 mg/dL 14   Creatinine Latest Ref Range: 0.44 - 1.00 mg/dL 1.02 (H)   Calcium Latest Ref Range: 8.9 - 10.3 mg/dL 9.6   EGFR (Non-African Amer.) Latest Ref Range: >60 mL/min 59 (L)   EGFR (African American) Latest Ref Range: >60 mL/min >60   Glucose Latest Ref Range: 65 - 99 mg/dL 98   Anion gap Latest Ref Range: 5 - 15  10   Alkaline Phosphatase Latest Ref Range: 38 - 126 U/L 156 (H)   Albumin Latest Ref Range: 3.5 - 5.0 g/dL 4.4   AST Latest Ref Range: 15 - 41 U/L 23   ALT Latest Ref Range: 14 - 54 U/L 49   Total Protein Latest Ref Range: 6.5 - 8.1 g/dL 7.6   Total Bilirubin Latest Ref Range: 0.3 - 1.2 mg/dL 0.5   LDH Latest Ref Range: 98 - 192 U/L 116   Iron Latest Ref Range: 28 - 170 ug/dL 55   UIBC Latest Units: ug/dL 351   TIBC Latest Ref Range: 250 - 450 ug/dL 406   Saturation Ratios Latest Ref Range: 10.4 - 31.8 % 14   Ferritin Latest Ref Range: 11 - 307 ng/mL 57   Folate Latest Ref  Range: >5.9 ng/mL 5.2 (L)   Vitamin B12 Latest Ref Range: 180 - 914 pg/mL 872   Methodology Unknown Comment   Total Protein ELP Latest Ref Range: 6.0 - 8.5 g/dL 7.0 7.1  Albumin ELP Latest Ref Range: 2.9 - 4.4 g/dL 3.9   Globulin, Total Latest Ref Range: 2.2 - 3.9 g/dL 3.1   A/G Ratio Latest Ref Range: 0.7 - 1.7  1.3   Alpha-1-Globulin Latest Ref Range: 0.0 - 0.4 g/dL 0.3   Alpha-2-Globulin Latest Ref Range: 0.4 - 1.0 g/dL 0.9   Beta Globulin Latest Ref Range: 0.7 - 1.3 g/dL 1.1     Gamma Globulin Latest Ref Range: 0.4 - 1.8 g/dL 0.8   M-SPIKE, % Latest Ref Range: Not Observed g/dL Not Observed   SPE Interp. Unknown Comment   Comment Unknown Comment   IgG (Immunoglobin G), Serum Latest Ref Range: 700 - 1,600 mg/dL 909 919  IgA Latest Ref Range: 87 - 352 mg/dL 123 120  IgM, Serum Latest Ref Range: 26 - 217 mg/dL 15 (L) 15 (L)  Kappa free light chain Latest Ref Range: 3.3 - 19.4 mg/L 12.7   Lamda free light chains Latest Ref Range: 5.7 - 26.3 mg/L 11.4   Kappa, lamda light chain ratio Latest Ref Range: 0.26 - 1.65  1.11   WBC Latest Ref Range: 4.0 - 10.5 K/uL 10.9 (H)   RBC Latest Ref Range: 3.87 - 5.11 MIL/uL 4.04   Hemoglobin Latest Ref Range: 12.0 - 15.0 g/dL 12.8   HCT Latest Ref Range: 36.0 - 46.0 % 36.2   MCV Latest Ref Range: 78.0 - 100.0 fL 89.6   MCH Latest Ref Range: 26.0 - 34.0 pg 31.7   MCHC Latest Ref Range: 30.0 - 36.0 g/dL 35.4   RDW Latest Ref Range: 11.5 - 15.5 % 13.4   Platelets Latest Ref Range: 150 - 400 K/uL 401 (H)   Neutrophils Latest Units: % 74   Lymphocytes Latest Units: % 14   Monocytes Relative Latest Units: % 10   Eosinophil Latest Units: % 2   Basophil Latest Units: % 0   NEUT# Latest Ref Range: 1.7 - 7.7 K/uL 8.2 (H)   Lymphocyte # Latest Ref Range: 0.7 - 4.0 K/uL 1.5   Monocyte # Latest Ref Range: 0.1 - 1.0 K/uL 1.1 (H)   Eosinophils Absolute Latest Ref Range: 0.0 - 0.7 K/uL 0.2   Basophils Absolute Latest Ref Range: 0.0 - 0.1 K/uL 0.0   RBC. Latest Ref Range: 3.87 - 5.11 MIL/uL 4.04   Retic Ct Pct Latest Ref Range: 0.4 - 3.1 % 1.5   Retic Count, Manual Latest Ref Range: 19.0 - 186.0 K/uL 60.6   Haptoglobin Latest Ref Range: 34 - 200 mg/dL 199   Sed Rate Latest Ref Range: 0 - 22 mm/hr 9   Immunofixation Result, Serum Unknown  Comment  CALR + JAK2 E12-15 + MPL (REFLEXED) Unknown Rpt   JAK2 V617F, W REFLEX TO CALR/E12/MPL Unknown Rpt   Path Review Unknown Reviewed By Michail Jewels...             RADIOGRAPHIC STUDIES: I have  personally reviewed the radiological images as listed and agreed with the findings in the report. No results found.  ASSESSMENT & PLAN:  Thrombocytosis Anemia History elevated LFT's Hyponatremia Hypokalemia Folate deficiency  Laboratory studies were reviewed with the patient. Results are noted above.  Hyponatremia is improved. I have prescribed potassium for her ongoing hypokalemia. I have faxed her labs down to cardiology. Her  hyponatremia is a chronic issue.  She is to continue on her folic acid. Ferritin is 57 ng/ml, will monitor.  MPD evaluation is negative.   Given her leukocytosis that is persistent I have ordered flow cytometry and BCR/ABL. She will be apprised of the results once available   Orders Placed This Encounter  Procedures  . BCR-ABL1, CML/ALL, PCR, QUANT    Standing Status:   Future    Standing Expiration Date:   02/25/2017  . CBC with Differential    Standing Status:   Future    Standing Expiration Date:   02/25/2017   Follow up with patient in 2 months with repeat CBC.   All questions were answered. The patient knows to call the clinic with any problems, questions or concerns.  This document serves as a record of services personally performed by Ancil Linsey, MD. It was created on her behalf by Elmyra Ricks, a trained medical scribe. The creation of this record is based on the scribe's personal observations and the provider's statements to them. This document has been checked and approved by the attending provider.  I have reviewed the above documentation for accuracy and completeness and I agree with the above.  This note was electronically signed.    Molli Hazard, MD  02/26/2016 12:36 PM

## 2016-02-26 NOTE — Patient Instructions (Signed)
[place] at [facility] Discharge Instructions  RECOMMENDATIONS MADE BY THE CONSULTANT AND ANY TEST RESULTS WILL BE SENT TO YOUR REFERRING PHYSICIAN.  Exam and discussion today with Dr. Whitney Muse. Return as scheduled in 8 weeks for lab work and office visit. K-Dur (potassium) sent to your pharmacy.   Thank you for choosing [place] at [facility] to provide your oncology and hematology care.  To afford each patient quality time with our provider, please arrive at least 15 minutes before your scheduled appointment time.   Beginning January 23rd 2017 lab work for the [facility] will be done in the  Main lab at [facility] on 1st floor. If you have a lab appointment with the [facility] please come in thru the  Main Entrance and check in at the main information desk  You need to re-schedule your appointment should you arrive 10 or more minutes late.  We strive to give you quality time with our providers, and arriving late affects you and other patients whose appointments are after yours.  Also, if you no show three or more times for appointments you may be dismissed from the clinic at the providers discretion.     Again, thank you for choosing [facility].  Our hope is that these requests will decrease the amount of time that you wait before being seen by our physicians.       _____________________________________________________________  Should you have questions after your visit to [facility], please contact our office at [phone] between the hours of 8:30 a.m. and 4:30 p.m.  Voicemails left after 4:30 p.m. will not be returned until the following business day.  For prescription refill requests, have your pharmacy contact our office.         Resources For Cancer Patients and their Caregivers ? American Cancer Society: Can assist with transportation, wigs, general needs, runs Look Good Feel Better.         320-680-7895 ? Cancer Care: Provides financial assistance, online support groups, medication/co-pay assistance.  1-800-813-HOPE 8023464171) ? [place] Assists [place] cancer patients and their families through emotional , educational and financial support.  475-846-0761 ? [place] DSS Where to apply for food stamps, Medicaid and utility assistance. (717)666-8663 ? RCATS: Transportation to medical appointments. 330-474-1355 ? Social Security Administration: May apply for disability if have a Stage IV cancer. [phone] ? [employer], Disability and Transit Services: Assists with nutrition, care and transit needs. [place] Support Programs: @10RELATIVEDAYS @ > Cancer Support Group  2nd Tuesday of the month 1pm-2pm, Journey Room  > Creative Journey  3rd Tuesday of the month 1130am-1pm, Journey Room  > Look Good Feel Better  1st Wednesday of the month 10am-12 noon, Journey Room (Call [place] to register [phone])

## 2016-02-27 ENCOUNTER — Encounter (HOSPITAL_COMMUNITY): Payer: Self-pay | Admitting: Hematology & Oncology

## 2016-03-04 ENCOUNTER — Ambulatory Visit: Payer: Commercial Managed Care - HMO | Admitting: Nutrition

## 2016-03-31 ENCOUNTER — Other Ambulatory Visit (HOSPITAL_COMMUNITY): Payer: Self-pay | Admitting: Hematology & Oncology

## 2016-04-15 ENCOUNTER — Other Ambulatory Visit (HOSPITAL_COMMUNITY): Payer: Self-pay | Admitting: Hematology & Oncology

## 2016-04-22 ENCOUNTER — Other Ambulatory Visit (HOSPITAL_COMMUNITY): Payer: Commercial Managed Care - HMO

## 2016-04-22 ENCOUNTER — Ambulatory Visit (HOSPITAL_COMMUNITY): Payer: Commercial Managed Care - HMO | Admitting: Oncology

## 2016-04-28 ENCOUNTER — Other Ambulatory Visit (HOSPITAL_COMMUNITY): Payer: Self-pay | Admitting: Hematology & Oncology

## 2016-04-30 DIAGNOSIS — G894 Chronic pain syndrome: Secondary | ICD-10-CM | POA: Diagnosis not present

## 2016-04-30 DIAGNOSIS — R300 Dysuria: Secondary | ICD-10-CM | POA: Diagnosis not present

## 2016-04-30 DIAGNOSIS — E063 Autoimmune thyroiditis: Secondary | ICD-10-CM | POA: Diagnosis not present

## 2016-05-22 ENCOUNTER — Encounter (HOSPITAL_COMMUNITY): Payer: Commercial Managed Care - HMO

## 2016-05-22 ENCOUNTER — Encounter (HOSPITAL_COMMUNITY): Payer: Commercial Managed Care - HMO | Attending: Oncology | Admitting: Oncology

## 2016-05-22 ENCOUNTER — Encounter (HOSPITAL_COMMUNITY): Payer: Self-pay | Admitting: Oncology

## 2016-05-22 VITALS — BP 123/70 | HR 91 | Temp 98.7°F | Resp 18

## 2016-05-22 DIAGNOSIS — D75839 Thrombocytosis, unspecified: Secondary | ICD-10-CM

## 2016-05-22 DIAGNOSIS — D473 Essential (hemorrhagic) thrombocythemia: Secondary | ICD-10-CM | POA: Insufficient documentation

## 2016-05-22 DIAGNOSIS — D729 Disorder of white blood cells, unspecified: Secondary | ICD-10-CM | POA: Diagnosis not present

## 2016-05-22 DIAGNOSIS — D72828 Other elevated white blood cell count: Secondary | ICD-10-CM | POA: Insufficient documentation

## 2016-05-22 DIAGNOSIS — E538 Deficiency of other specified B group vitamins: Secondary | ICD-10-CM

## 2016-05-22 HISTORY — DX: Deficiency of other specified B group vitamins: E53.8

## 2016-05-22 LAB — CBC WITH DIFFERENTIAL/PLATELET
Basophils Absolute: 0.1 10*3/uL (ref 0.0–0.1)
Basophils Relative: 1 %
Eosinophils Absolute: 0.4 10*3/uL (ref 0.0–0.7)
Eosinophils Relative: 5 %
HEMATOCRIT: 35.5 % — AB (ref 36.0–46.0)
HEMOGLOBIN: 12.1 g/dL (ref 12.0–15.0)
LYMPHS ABS: 1.8 10*3/uL (ref 0.7–4.0)
Lymphocytes Relative: 22 %
MCH: 31.1 pg (ref 26.0–34.0)
MCHC: 34.1 g/dL (ref 30.0–36.0)
MCV: 91.3 fL (ref 78.0–100.0)
MONOS PCT: 6 %
Monocytes Absolute: 0.5 10*3/uL (ref 0.1–1.0)
NEUTROS ABS: 5.6 10*3/uL (ref 1.7–7.7)
NEUTROS PCT: 66 %
Platelets: 447 10*3/uL — ABNORMAL HIGH (ref 150–400)
RBC: 3.89 MIL/uL (ref 3.87–5.11)
RDW: 15.3 % (ref 11.5–15.5)
WBC: 8.4 10*3/uL (ref 4.0–10.5)

## 2016-05-22 NOTE — Assessment & Plan Note (Signed)
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  Labs in 4 months: Folate.

## 2016-05-22 NOTE — Progress Notes (Signed)
Glo Herring., MD [place] [id number]  Thrombocytosis [facility]) - Plan: CBC with Differential, Iron and TIBC, Ferritin, Folate  Folate deficiency - Plan: Folate  CURRENT THERAPY:  Surveillance  INTERVAL HISTORY: Rita Stephens 61 y.o. female returns for followup of thrombocytosis with anemia and leukocytosis.  AND Folate deficiency, on Folic acid 1 mg daily.  She denies any B symptoms.  She denies any fevers, chills, night sweats.  She denies any interim cardiovascular issues.  "When can I be released?"  She denies any blood loss. She denies any blood in her stools or dark stools. She reports a stable appetite. She denies any weight loss.  She denies any hematologic complaints.  Review of Systems  Constitutional: Negative.  Negative for chills, fever, malaise/fatigue and weight loss.  HENT: Negative.   Eyes: Negative.   Respiratory: Negative.  Negative for cough.   Cardiovascular: Negative.  Negative for chest pain.  Gastrointestinal: Negative.  Negative for blood in stool, constipation, diarrhea, melena, nausea and vomiting.  Genitourinary: Negative.  Negative for hematuria.  Musculoskeletal: Negative.   Skin: Negative.  Negative for rash.  Neurological: Negative.  Negative for weakness.  Endo/Heme/Allergies: Negative.  Does not bruise/bleed easily.  Psychiatric/Behavioral: Negative.     Past Medical History:  Diagnosis Date  . Anxiety   . Chronic pain   . Essential hypertension   . 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 History of cardiac catheterization    Normal coronaries November 2016  . History of pneumonia   . Hypothyroidism   . Migraine   . Ovarian cyst   . Spinal stenosis   . Takotsubo syndrome    November 2016  . Thrombocytosis ([place]) 01/24/2016    Past Surgical History:  Procedure Laterality Date  . CARDIAC CATHETERIZATION N/A 02/23/2015   Procedure: Left Heart Cath and Coronary Angiography;  Surgeon: Jettie Booze,  MD;  Location: [facility];  Service: Cardiovascular;  Laterality: N/A;  . CHOLECYSTECTOMY    . DOPPLER ECHOCARDIOGRAPHY    . GUM SURGERY     Removed cyst  . LEEP      Family History  Problem Relation Age of Onset  . Hypertension Father   . Breast cancer Mother     metastatic to brain  . Migraines Sister   . Migraines Brother   . Heart attack Maternal Grandfather   . Seizures Neg Hx     Social History   Social History  . Marital status: Married    Spouse name: N/A  . Number of children: 1  . Years of education: HS   Social History Main Topics  . Smoking status: Former Smoker    Packs/day: 0.75    Years: 30.00    Types: Cigarettes  . Smokeless tobacco: Never Used  . Alcohol use No  . Drug use: No  . Sexual activity: Not on file   Other Topics Concern  . Not on file   Social History Narrative   She lives with husband and daughter.  She is working as a Radiation protection practitioner.      Patient does not drink caffeine.   Patient is right handed.            PHYSICAL EXAMINATION  ECOG PERFORMANCE STATUS: 0 - Asymptomatic  Vitals:   05/22/16 1429  BP: 123/70  Pulse: 91  Resp: 18  Temp: 98.7 F (37.1 C)    GENERAL:alert, no distress, well nourished, well developed, comfortable, cooperative, obese,  smiling and accompanied by her husband SKIN: skin color, texture, turgor are normal, no rashes or significant lesions HEAD: Normocephalic, No masses, lesions, tenderness or abnormalities EYES: normal, EOMI, Conjunctiva are pink and non-injected EARS: External ears normal OROPHARYNX:lips, buccal mucosa, and tongue normal and mucous membranes are moist  NECK: supple, no adenopathy, trachea midline LYMPH:  no palpable lymphadenopathy BREAST:not examined LUNGS: clear to auscultation  HEART: regular rate & rhythm ABDOMEN:abdomen soft and normal bowel sounds BACK: Back symmetric, no curvature. EXTREMITIES:less then 2 second capillary refill, no joint deformities, effusion,  or inflammation, no skin discoloration, no cyanosis  NEURO: alert & oriented x 3 with fluent speech, no focal motor/sensory deficits, gait normal   LABORATORY DATA: CBC    Component Value Date/Time   WBC 8.4 05/22/2016 1405   RBC 3.89 05/22/2016 1405   HGB 12.1 05/22/2016 1405   HCT 35.5 (L) 05/22/2016 1405   PLT 447 (H) 05/22/2016 1405   MCV 91.3 05/22/2016 1405   MCH 31.1 05/22/2016 1405   MCHC 34.1 05/22/2016 1405   RDW 15.3 05/22/2016 1405   LYMPHSABS 1.8 05/22/2016 1405   MONOABS 0.5 05/22/2016 1405   EOSABS 0.4 05/22/2016 1405   BASOSABS 0.1 05/22/2016 1405      Chemistry      Component Value Date/Time   NA 134 (L) 02/26/2016 1147   K 3.1 (L) 02/26/2016 1147   CL 100 (L) 02/26/2016 1147   CO2 25 02/26/2016 1147   BUN 12 02/26/2016 1147   CREATININE 1.40 (H) 02/26/2016 1147      Component Value Date/Time   CALCIUM 9.1 02/26/2016 1147   ALKPHOS 225 (H) 02/26/2016 1147   AST 63 (H) 02/26/2016 1147   ALT 95 (H) 02/26/2016 1147   BILITOT 0.8 02/26/2016 1147        PENDING LABS:   RADIOGRAPHIC STUDIES:  No results found.   PATHOLOGY:    ASSESSMENT AND PLAN:  Thrombocytosis (HCC) Thrombocytosis with mild normocytic, normochromic anemia with leukocytosis.  JAK2 V617F/JAK Exon 12-15/CALR/MPL negative.    Labs today: CBC diff, BCR/ABL.  I personally reviewed and went over laboratory results with the patient.  The results are noted within this dictation.  WBC is WNL, HGB is WNL.  Thrombocytosis is stable and minimal.  Labs in 4 months: CBC diff, iron/TIBC, ferritin, folate. Based upon her iron studies in a few months, I would consider starting PO iron replacement therapy as this may be contributing to her thrombocytosis; given her high-normal TIBC and low-normal Ferritin in the Fall 2017.  Return in 4 months for follow-up  Folate deficiency Folate deficiency, started on Folvite 1 mg daily.  Labs in 4 months: Folate.   ORDERS PLACED FOR THIS  ENCOUNTER: Orders Placed This Encounter  Procedures  . CBC with Differential  . Iron and TIBC  . Ferritin  . Folate    MEDICATIONS PRESCRIBED THIS ENCOUNTER: No orders of the defined types were placed in this encounter.   THERAPY PLAN:  Ongoing work-up and surveillance.  All questions were answered. The patient knows to call the clinic with any problems, questions or concerns. We can certainly see the patient much sooner if necessary.  Patient and plan discussed with Dr. Twana First and she is in agreement with the aforementioned.   This note is electronically signed by: Doy Mince 05/22/2016 6:07 PM

## 2016-05-22 NOTE — Patient Instructions (Addendum)
[place] at [facility] Discharge Instructions  RECOMMENDATIONS MADE BY THE CONSULTANT AND ANY TEST RESULTS WILL BE SENT TO YOUR REFERRING PHYSICIAN.  Labs in 4 months  Return in 4 months for follow up  Thank you for choosing [place] at [facility] to provide your oncology and hematology care.  To afford each patient quality time with our provider, please arrive at least 15 minutes before your scheduled appointment time.    If you have a lab appointment with the [facility] please come in thru the  Main Entrance and check in at the main information desk  You need to re-schedule your appointment should you arrive 10 or more minutes late.  We strive to give you quality time with our providers, and arriving late affects you and other patients whose appointments are after yours.  Also, if you no show three or more times for appointments you may be dismissed from the clinic at the providers discretion.     Again, thank you for choosing [facility].  Our hope is that these requests will decrease the amount of time that you wait before being seen by our physicians.       _____________________________________________________________  Should you have questions after your visit to [facility], please contact our office at [phone] between the hours of 8:30 a.m. and 4:30 p.m.  Voicemails left after 4:30 p.m. will not be returned until the following business day.  For prescription refill requests, have your pharmacy contact our office.       Resources For Cancer Patients and their Caregivers ? American Cancer Society: Can assist with transportation, wigs, general needs, runs Look Good Feel Better.        [phone] ? Cancer Care: Provides financial assistance, online support groups, medication/co-pay assistance.  1-800-813-HOPE (386) 868-8576) ? [place] Assists [place] cancer patients  and their families through emotional , educational and financial support.  4087094470 ? [place] DSS Where to apply for food stamps, Medicaid and utility assistance. 218-046-5039 ? RCATS: Transportation to medical appointments. (717)629-8251 ? Social Security Administration: May apply for disability if have a Stage IV cancer. 270-627-9833 727-562-4926 ? [employer], Disability and Transit Services: Assists with nutrition, care and transit needs. [place] Support Programs: @10RELATIVEDAYS @ > Cancer Support Group  2nd Tuesday of the month 1pm-2pm, Journey Room  > Creative Journey  3rd Tuesday of the month 1130am-1pm, Journey Room  > Look Good Feel Better  1st Wednesday of the month 10am-12 noon, Journey Room (Call [place] to register [phone])

## 2016-05-22 NOTE — Assessment & Plan Note (Addendum)
Thrombocytosis with mild normocytic, normochromic anemia with leukocytosis.  JAK2 V617F/JAK Exon 12-15/CALR/MPL negative.    Labs today: CBC diff, BCR/ABL.  I personally reviewed and went over laboratory results with the patient.  The results are noted within this dictation.  WBC is WNL, HGB is WNL.  Thrombocytosis is stable and minimal.  Labs in 4 months: CBC diff, iron/TIBC, ferritin, folate. Based upon her iron studies in a few months, I would consider starting PO iron replacement therapy as this may be contributing to her thrombocytosis; given her high-normal TIBC and low-normal Ferritin in the Fall 2017.  Return in 4 months for follow-up

## 2016-05-26 LAB — BCR-ABL1, CML/ALL, PCR, QUANT

## 2016-05-27 ENCOUNTER — Other Ambulatory Visit (HOSPITAL_COMMUNITY): Payer: Self-pay | Admitting: Hematology & Oncology

## 2016-06-09 ENCOUNTER — Other Ambulatory Visit (HOSPITAL_COMMUNITY): Payer: Self-pay | Admitting: Hematology & Oncology

## 2016-06-09 NOTE — Telephone Encounter (Signed)
Future refills should be filled by PCP.

## 2016-06-30 DIAGNOSIS — G894 Chronic pain syndrome: Secondary | ICD-10-CM | POA: Diagnosis not present

## 2016-06-30 DIAGNOSIS — R51 Headache: Secondary | ICD-10-CM | POA: Diagnosis not present

## 2016-07-01 ENCOUNTER — Other Ambulatory Visit (HOSPITAL_COMMUNITY): Payer: Self-pay | Admitting: Adult Health

## 2016-07-01 DIAGNOSIS — E876 Hypokalemia: Secondary | ICD-10-CM

## 2016-07-01 MED ORDER — POTASSIUM CHLORIDE ER 10 MEQ PO TBCR: 10.0000 meq | EXTENDED_RELEASE_TABLET | Freq: Two times a day (BID) | ORAL | 3 refills | Status: DC

## 2016-07-04 ENCOUNTER — Other Ambulatory Visit (HOSPITAL_COMMUNITY): Payer: Self-pay | Admitting: Internal Medicine

## 2016-07-04 DIAGNOSIS — R51 Headache: Principal | ICD-10-CM

## 2016-07-04 DIAGNOSIS — R519 Headache, unspecified: Secondary | ICD-10-CM

## 2016-07-17 ENCOUNTER — Ambulatory Visit (HOSPITAL_COMMUNITY): Payer: Commercial Managed Care - HMO

## 2016-07-23 ENCOUNTER — Other Ambulatory Visit (HOSPITAL_COMMUNITY): Payer: Self-pay | Admitting: Hematology & Oncology

## 2016-08-05 ENCOUNTER — Ambulatory Visit (HOSPITAL_COMMUNITY)
Admission: RE | Admit: 2016-08-05 | Discharge: 2016-08-05 | Disposition: A | Discharge status: Home / Self Care | Payer: Commercial Managed Care - HMO | Source: Ambulatory Visit | Attending: Internal Medicine | Admitting: Internal Medicine

## 2016-08-05 DIAGNOSIS — R51 Headache: Secondary | ICD-10-CM | POA: Insufficient documentation

## 2016-08-05 DIAGNOSIS — R519 Headache, unspecified: Secondary | ICD-10-CM

## 2016-08-13 DIAGNOSIS — I249 Acute ischemic heart disease, unspecified: Secondary | ICD-10-CM | POA: Diagnosis not present

## 2016-08-13 DIAGNOSIS — G4700 Insomnia, unspecified: Secondary | ICD-10-CM | POA: Diagnosis not present

## 2016-08-13 DIAGNOSIS — J329 Chronic sinusitis, unspecified: Secondary | ICD-10-CM | POA: Diagnosis not present

## 2016-08-13 DIAGNOSIS — E271 Primary adrenocortical insufficiency: Secondary | ICD-10-CM | POA: Diagnosis not present

## 2016-09-05 DIAGNOSIS — N951 Menopausal and female climacteric states: Secondary | ICD-10-CM | POA: Diagnosis not present

## 2016-09-09 ENCOUNTER — Encounter: Payer: Self-pay | Admitting: Neurology

## 2016-09-09 ENCOUNTER — Ambulatory Visit (INDEPENDENT_AMBULATORY_CARE_PROVIDER_SITE_OTHER): Payer: Commercial Managed Care - HMO | Admitting: Neurology

## 2016-09-09 VITALS — BP 132/68 | HR 76 | Resp 18 | Ht 60.75 in | Wt 159.0 lb

## 2016-09-09 DIAGNOSIS — R400 Somnolence: Secondary | ICD-10-CM | POA: Diagnosis not present

## 2016-09-09 DIAGNOSIS — F1190 Opioid use, unspecified, uncomplicated: Secondary | ICD-10-CM

## 2016-09-09 DIAGNOSIS — G2581 Restless legs syndrome: Secondary | ICD-10-CM | POA: Diagnosis not present

## 2016-09-09 DIAGNOSIS — G479 Sleep disorder, unspecified: Secondary | ICD-10-CM | POA: Diagnosis not present

## 2016-09-09 DIAGNOSIS — R351 Nocturia: Secondary | ICD-10-CM | POA: Diagnosis not present

## 2016-09-09 DIAGNOSIS — R0683 Snoring: Secondary | ICD-10-CM

## 2016-09-09 DIAGNOSIS — R51 Headache: Secondary | ICD-10-CM | POA: Diagnosis not present

## 2016-09-09 DIAGNOSIS — G40909 Epilepsy, unspecified, not intractable, without status epilepticus: Secondary | ICD-10-CM | POA: Diagnosis not present

## 2016-09-09 DIAGNOSIS — R519 Headache, unspecified: Secondary | ICD-10-CM

## 2016-09-09 NOTE — Progress Notes (Signed)
Subjective:    Patient ID: Rita Stephens is a 61 y.o. female.  HPI     Rita Age, MD, PhD [facility] [address] [address] [place], [place] [address]  Dear Dr. Gerarda Stephens,  I saw your patient, Rita Stephens, upon your kind request in my neurologic clinic today for initial consultation of her sleep disturbance, in particular, concern for underlying obstructive sleep apnea with a prior abnormal overnight pulse oximetry test. The patient is unaccompanied today. Of note, she had been referred in the past for sleep evaluation, but no showed in December 2015. As you know, Rita Stephens is a 61 year old right-handed woman with an underlying medical history of reflux disease, overweight state, migraine headaches and seizures (followed by Dr. Jannifer Stephens), smoking, hypertension, hypothyroidism and anxiety, who reports snoring and daytime tiredness, nonrestorative sleep, nocturia and waking up with headaches at times. She does not wake up rested. Of note, she had an overnight pulse oximetry test on 02/27/2014. I had previously reviewed the results and there were some disruptions with lost signal.  Average oxygen saturation reported to be 90.4%, nadir was 82%. Time below 89% saturation was 2 hours and 12 minutes. I reviewed your office note from 08/13/2016. She has had multiple medication trials for sleep in the past, including Ambien, trazodone, Seroquel, Rozerem and Belsomra.  She had sleep study testing in 2013 but did not get the results at the time. I reviewed the report from Rita Stephens: sleep study on 04/09/12 showed sleep efficiency was 96%, sleep latency was 1.5 min, REM latency was 128 minutes. Baseline O2 sats 97%, nadir was 83%, AHI 1.5/hour, PLM index was 0/hour. She had 27% of slow wave sleep and REM sleep was 24%. Her Epworth sleepiness score is 9 out of 24, fatigue score is 61 out of 63. She had an increase in her Zonegran last year. She is currently on Belsomra, and 150  mg of seroquel and also Xanax 1 mg in AM and 2 mg qHS, oxycodone 15 mg bid. She takes Zquil 2 pills at night.   Her Past Medical History Is Significant For: Past Medical History:  Diagnosis Date  . Anxiety   . Chronic pain   . Essential hypertension   . 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 History of cardiac catheterization    Normal coronaries November 2016  . History of pneumonia   . Hypothyroidism   . Migraine   . Ovarian cyst   . Spinal stenosis   . Takotsubo syndrome    November 2016  . Thrombocytosis (Rita Stephens) 01/24/2016    Her Past Surgical History Is Significant For: Past Surgical History:  Procedure Laterality Date  . CARDIAC CATHETERIZATION N/A 02/23/2015   Procedure: Left Heart Cath and Coronary Angiography;  Surgeon: Jettie Booze, MD;  Location: [facility];  Service: Cardiovascular;  Laterality: N/A;  . CHOLECYSTECTOMY    . DOPPLER ECHOCARDIOGRAPHY    . GUM SURGERY     Removed cyst  . LEEP      Her Family History Is Significant For: Family History  Problem Relation Stephens of Onset  . Hypertension Father   . Breast cancer Mother        metastatic to brain  . Migraines Sister   . Migraines Brother   . Heart attack Maternal Grandfather   . Seizures Neg Hx     Her Social History Is Significant For: Social History   Social History  . Marital status: Married  Spouse name: N/A  . Number of children: 1  . Years of education: HS   Occupational History  . N/A    Social History Main Topics  . Smoking status: Current Every Day Smoker    Packs/day: 0.75    Years: 30.00    Types: Cigarettes  . Smokeless tobacco: Never Used  . Alcohol use 0.0 oz/week  . Drug use: No  . Sexual activity: Not Asked   Other Topics Concern  . None   Social History Narrative   She lives with husband and daughter.  She is working as a Radiation protection practitioner.      Patient does not drink caffeine.   Patient is right handed.           Her Allergies Are:  Allergies  Allergen  Reactions  . Amitriptyline Itching    Allergy identified by daughter  . Actifed Cold-Allergy [Chlorpheniramine-Phenyleph Er] Other (See Comments)    ALTERS MENTAL STATUS  . Sudafed [Pseudoephedrine Hcl] Other (See Comments)    Climbs walls  . Other Palpitations    Topamax  :   Her Current Medications Are:  Outpatient Encounter Prescriptions as of 09/09/2016  Medication Sig  . ALPRAZolam (XANAX) 1 MG tablet Take 1 mg by mouth 4 (four) times daily as needed for anxiety.   [name] Kitchen aspirin EC 81 MG EC tablet Take 1 tablet (81 mg total) by mouth daily.  [name] Kitchen atorvastatin (LIPITOR) 40 MG tablet Take 1 tablet (40 mg total) by mouth daily at 6 PM.  . BELSOMRA 20 MG TABS   . carvedilol (COREG) 6.25 MG tablet Take 1 tablet (6.25 mg total) by mouth 2 (two) times daily with a meal.  . Diclofenac Sodium (PENNSAID TD) Place 400 mg onto the skin 2 (two) times daily.  . DULoxetine (CYMBALTA) 30 MG capsule Take 30 mg by mouth daily. Take 30 mg capsule along with 60 mg capsule to equal 90 mg daily.  . DULoxetine (CYMBALTA) 60 MG capsule Take 60 mg by mouth daily. Take 60 mg capsule along with a 30 mg capsule to equal 90 mg daily.  . folic acid (FOLVITE) 1 MG tablet TAKE ONE (1) TABLET BY MOUTH EVERY DAY  . furosemide (LASIX) 20 MG tablet Take 20 mg by mouth daily as needed for fluid.  . hydrochlorothiazide (MICROZIDE) 12.5 MG capsule Take 12.5 mg by mouth daily.   [name] Kitchen ibuprofen (ADVIL,MOTRIN) 200 MG tablet Take 400-800 mg by mouth every 6 (six) hours as needed for moderate pain.  [name] Kitchen levothyroxine (SYNTHROID, LEVOTHROID) 88 MCG tablet Take 88 mcg by mouth daily before breakfast.  . Magnesium Salicylate (DOANS PILLS PO) Take 2 tablets by mouth daily as needed (pain).  . Multiple Vitamin (MULTIVITAMIN WITH MINERALS) TABS tablet Take 1 tablet by mouth daily.  . NON FORMULARY z-quil  . olmesartan (BENICAR) 40 MG tablet Take 40 mg by mouth daily.  [name] Kitchen oxyCODONE (ROXICODONE) 15 MG immediate release tablet Take 15 mg by mouth  every 4 (four) hours as needed for pain.  . potassium chloride (K-DUR) 10 MEQ tablet Take 1 tablet (10 mEq total) by mouth 2 (two) times daily. (Patient taking differently: Take 10 mEq by mouth 3 (three) times daily. )  . QUEtiapine (SEROQUEL) 50 MG tablet   . zonisamide (ZONEGRAN) 50 MG capsule TAKE 2 CAPSULES IN THE MORNING AND 3 CAPSULES IN THE EVENING   No facility-administered encounter medications on file as of 09/09/2016.   :  Review of Systems:  Out of a complete 14  point review of systems, all are reviewed and negative with the exception of these symptoms as listed below: Review of Systems  Neurological:       Patient had a sleep study around 2013, states that she never received the results.  Patient has trouble falling and staying asleep, snores, wakes up feeling tired, headaches, daytime fatigue, denies taking naps.    Epworth Sleepiness Scale 0= would never doze 1= slight chance of dozing 2= moderate chance of dozing 3= high chance of dozing  Sitting and reading:2 Watching TV:2 Sitting inactive in a public place (ex. Theater or meeting):1 As a passenger in a car for an hour without a break:2 Lying down to rest in the afternoon:2 Sitting and talking to someone:0 Sitting quietly after lunch (no alcohol):0 In a car, while stopped in traffic:0 Total:9  Objective:  Neurologic Exam  Physical Exam Physical Examination:   Vitals:   09/09/16 1413  BP: 132/68  Pulse: 76  Resp: 18    General Examination: The patient is a very pleasant 61 y.o. female in no acute distress. She appears well-developed and well-nourished and well groomed.   HEENT: Normocephalic, atraumatic, pupils are equal, round and reactive to light and accommodation. Extraocular tracking is good without limitation to gaze excursion or nystagmus noted. Normal smooth pursuit is noted. Hearing is grossly intact. Face is symmetric with normal facial animation and normal facial sensation. Speech is clear with  no dysarthria noted. There is no hypophonia. There is no lip, neck/head, jaw or voice tremor. Neck is supple with full range of passive and active motion. There are no carotid bruits on auscultation. Oropharynx exam reveals: mild mouth dryness, adequate dental hygiene and moderate airway crowding, due to redundant soft palate, thicker tongue, tonsils are not fully visualized, smaller airway entry. Mallampati is class III. Tongue protrudes centrally and palate elevates symmetrically. Neck size is 17 inches. She has a Moderate overbite.   Chest: Clear to auscultation without wheezing, rhonchi or crackles noted.  Heart: S1+S2+0, regular and normal without murmurs, rubs or gallops noted.   Abdomen: Soft, non-tender and non-distended with normal bowel sounds appreciated on auscultation.  Extremities: There is no pitting edema in the distal lower extremities bilaterally.  Skin: Warm and dry without trophic changes noted.  Musculoskeletal: exam reveals no obvious joint deformities, tenderness or joint swelling or erythema, except for b/l knee pain. Back pain, scoliosis.  Neurologically:  Mental status: The patient is awake, alert and oriented in all 4 spheres. Her immediate and remote memory, attention, language skills and fund of knowledge are appropriate. There is no evidence of aphasia, agnosia, apraxia or anomia. Speech is clear with normal prosody and enunciation. Thought process is linear. Mood is normal and affect is normal.  Cranial nerves II - XII are as described above under HEENT exam. In addition: shoulder shrug is normal with equal shoulder height noted. Motor exam: Normal bulk, strength and tone is noted. There is no drift, tremor or rebound. Romberg is negative. Reflexes are 2+ throughout. Fine motor skills and coordination: intact with normal finger taps, normal hand movements, normal rapid alternating patting, normal foot taps and normal foot agility.  Cerebellar testing: No dysmetria or  intention tremor on finger to nose testing. Heel to shin is unremarkable bilaterally. There is no truncal or gait ataxia.  Sensory exam: intact to light touch in the upper and lower extremities.  Gait, station and balance: She stands easily. No veering to one side is noted. No leaning to one side  is noted. Posture is Stephens-appropriate and stance is narrow based. Gait shows normal stride length and normal pace. No problems turning are noted. Tandem walk is not possible for her due to knee pain.   Assessment and Plan:  In summary, Rita Stephens is a very pleasant 70 y.o.-year old female with an underlying medical history of reflux disease, overweight state, migraine headaches and seizures (followed by Dr. Jannifer Stephens), smoking, hypertension, hypothyroidism and anxiety, whose history and physical exam are concerning for obstructive sleep apnea (OSA). I had a long chat with the patient about my findings and the diagnosis of OSA, its prognosis and treatment options. We talked about medical treatments, surgical interventions and non-pharmacological approaches. I explained in particular the risks and ramifications of untreated moderate to severe OSA, especially with respect to developing cardiovascular disease down the Road, including congestive heart failure, difficult to treat hypertension, cardiac arrhythmias, or stroke. Even type 2 diabetes has, in part, been linked to untreated OSA. Symptoms of untreated OSA include daytime sleepiness, memory problems, mood irritability and mood disorder such as depression and anxiety, lack of energy, as well as recurrent headaches, especially morning headaches. We talked about smoking cessation and trying to maintain a healthy lifestyle in general, as well as the importance of weight control. I encouraged the patient to eat healthy, exercise daily and keep well hydrated, to keep a scheduled bedtime and wake time routine, to not skip any meals and eat healthy snacks in between meals. I  advised the patient not to drive when feeling sleepy. I recommended the following at this time: sleep study with potential positive airway pressure titration. (We will score hypopneas at 4%).  Furthermore, for her chronic sleep difficulties, she is encouraged to talk to you about pursuing cognitive behavioral therapy through psychology. I explained the sleep test procedure to the patient and also outlined possible surgical and non-surgical treatment options of OSA, including the use of a custom-made dental device (which would require a referral to a specialist dentist or oral surgeon), upper airway surgical options, such as pillar implants, radiofrequency surgery, tongue base surgery, and UPPP (which would involve a referral to an ENT surgeon). Rarely, jaw surgery such as mandibular advancement may be considered.  I also explained the CPAP treatment option to the patient, who indicated that she would be willing to try CPAP if the need arises. I explained the importance of being compliant with PAP treatment, not only for insurance purposes but primarily to improve Her symptoms, and for the patient's long term health benefit, including to reduce Her cardiovascular risks. I answered all her questions today and the patient was in agreement. I will likely see her back after the sleep study is completed and encouraged her to call with any interim questions, concerns, problems or updates.   Thank you very much for allowing me to participate in the care of this nice patient. If I can be of any further assistance to you please do not hesitate to call me at [phone].  Sincerely,   Rita Age, MD, PhD

## 2016-09-09 NOTE — Patient Instructions (Addendum)
Based on your symptoms and your exam I believe you are at risk for obstructive sleep apnea or OSA, and I think we should proceed with a sleep study to determine whether you do or do not have OSA and how severe it is. If you have more than mild OSA, I want you to consider treatment with CPAP. Please remember, the risks and ramifications of moderate to severe obstructive sleep apnea or OSA are: Cardiovascular disease, including congestive heart failure, stroke, difficult to control hypertension, arrhythmias, and even type 2 diabetes has been linked to untreated OSA. Sleep apnea causes disruption of sleep and sleep deprivation in most cases, which, in turn, can cause recurrent headaches, problems with memory, mood, concentration, focus, and vigilance. Most people with untreated sleep apnea report excessive daytime sleepiness, which can affect their ability to drive. Please do not drive if you feel sleepy.   I will likely see you back after your sleep study to go over the test results and where to go from there. We will call you after your sleep study to advise about the results (most likely, you will hear from Diana, my nurse) and to set up an appointment at the time, as necessary.    Our sleep lab administrative assistant, Dawn will meet with you or call you to schedule your sleep study. If you don't hear back from her by next week please feel free to call her at [phone]. This is her direct line and please leave a message with your phone number to call back if you get the voicemail box. She will call back as soon as possible.   Please remember to try to maintain good sleep hygiene, which means: Keep a regular sleep and wake schedule, try not to exercise or have a meal within 2 hours of your bedtime, try to keep your bedroom conducive for sleep, that is, cool and dark, without light distractors such as an illuminated alarm clock, and refrain from watching TV right before sleep or in the middle of the night  and do not keep the TV or radio on during the night. Also, try not to use or play on electronic devices at bedtime, such as your cell phone, tablet PC or laptop. If you like to read at bedtime on an electronic device, try to dim the background light as much as possible. Do not eat in the middle of the night.     

## 2016-09-17 ENCOUNTER — Other Ambulatory Visit (HOSPITAL_COMMUNITY): Payer: Commercial Managed Care - HMO

## 2016-09-22 ENCOUNTER — Ambulatory Visit (HOSPITAL_COMMUNITY): Payer: Commercial Managed Care - HMO | Admitting: Oncology

## 2016-09-23 ENCOUNTER — Telehealth: Payer: Self-pay | Admitting: Neurology

## 2016-09-23 DIAGNOSIS — R400 Somnolence: Secondary | ICD-10-CM

## 2016-09-23 DIAGNOSIS — R7981 Abnormal blood-gas level: Secondary | ICD-10-CM

## 2016-09-23 DIAGNOSIS — G479 Sleep disorder, unspecified: Secondary | ICD-10-CM

## 2016-09-23 DIAGNOSIS — E663 Overweight: Secondary | ICD-10-CM

## 2016-09-23 DIAGNOSIS — R0683 Snoring: Secondary | ICD-10-CM

## 2016-09-23 NOTE — Telephone Encounter (Signed)
[employer] denied Split sleep study however approved a HST.  Can I get an order?

## 2016-09-24 ENCOUNTER — Encounter (HOSPITAL_COMMUNITY): Payer: Self-pay | Admitting: Adult Health

## 2016-09-24 ENCOUNTER — Encounter (HOSPITAL_COMMUNITY): Payer: Commercial Managed Care - HMO | Attending: Adult Health | Admitting: Adult Health

## 2016-09-24 ENCOUNTER — Ambulatory Visit (HOSPITAL_COMMUNITY): Payer: Commercial Managed Care - HMO | Admitting: Adult Health

## 2016-09-24 ENCOUNTER — Encounter (HOSPITAL_COMMUNITY): Payer: Commercial Managed Care - HMO

## 2016-09-24 VITALS — BP 133/76 | HR 83 | Temp 99.2°F | Resp 18 | Ht 62.0 in | Wt 155.0 lb

## 2016-09-24 DIAGNOSIS — D72828 Other elevated white blood cell count: Secondary | ICD-10-CM | POA: Insufficient documentation

## 2016-09-24 DIAGNOSIS — E538 Deficiency of other specified B group vitamins: Secondary | ICD-10-CM

## 2016-09-24 DIAGNOSIS — D473 Essential (hemorrhagic) thrombocythemia: Secondary | ICD-10-CM | POA: Insufficient documentation

## 2016-09-24 DIAGNOSIS — D649 Anemia, unspecified: Secondary | ICD-10-CM | POA: Diagnosis not present

## 2016-09-24 DIAGNOSIS — D75839 Thrombocytosis, unspecified: Secondary | ICD-10-CM

## 2016-09-24 LAB — CBC WITH DIFFERENTIAL/PLATELET
BASOS ABS: 0 10*3/uL (ref 0.0–0.1)
Basophils Relative: 0 %
EOS PCT: 1 %
Eosinophils Absolute: 0.1 10*3/uL (ref 0.0–0.7)
HCT: 34 % — ABNORMAL LOW (ref 36.0–46.0)
HEMOGLOBIN: 11.6 g/dL — AB (ref 12.0–15.0)
LYMPHS ABS: 1.3 10*3/uL (ref 0.7–4.0)
LYMPHS PCT: 17 %
MCH: 31.4 pg (ref 26.0–34.0)
MCHC: 34.1 g/dL (ref 30.0–36.0)
MCV: 91.9 fL (ref 78.0–100.0)
Monocytes Absolute: 0.4 10*3/uL (ref 0.1–1.0)
Monocytes Relative: 5 %
NEUTROS ABS: 5.9 10*3/uL (ref 1.7–7.7)
NEUTROS PCT: 77 %
Platelets: 413 10*3/uL — ABNORMAL HIGH (ref 150–400)
RBC: 3.7 MIL/uL — AB (ref 3.87–5.11)
RDW: 13.2 % (ref 11.5–15.5)
WBC: 7.7 10*3/uL (ref 4.0–10.5)

## 2016-09-24 LAB — IRON AND TIBC
Iron: 66 ug/dL (ref 28–170)
SATURATION RATIOS: 22 % (ref 10.4–31.8)
TIBC: 302 ug/dL (ref 250–450)
UIBC: 236 ug/dL

## 2016-09-24 LAB — FERRITIN: FERRITIN: 68 ng/mL (ref 11–307)

## 2016-09-24 NOTE — Progress Notes (Signed)
Rita Stephens, [place] [address]   CLINIC:  Medical Oncology/Hematology  PCP:  [facility], [place] [address] [phone]   REASON FOR VISIT:  Follow-up for Thrombocytosis, anemia, & leukocytosis   CURRENT THERAPY: Surveillance     HISTORY OF PRESENT ILLNESS:  (From Rita Crigler, PA-C's last note on 05/22/16)      INTERVAL HISTORY:  [name] 61 y.o. female returns to cancer center for routine follow-up for thrombocytosis, anemia, and leukocytosis.   She is here today unaccompanied. Overall, she tells me she has been feeling quite well. Reports fatigue, with energy levels at 50%. She attributes some of the fatigue to her chronic insomnia (which is managed by another provider).  Appetite is 100%.   Denies any active bleeding episodes including blood in her stools, dark/tarry stools, hematuria, vaginal bleeding, nosebleeds, or gingival bleeding.  Denies any chest pain or shortness of breath. No leg swelling.  Does report occasional cramps in her feet; she would like to know if we have any recommendations for something that may help.   Endorses feeling the urge to urinate, "but I sit on the toilet for 2 hours and cannot pee." This has been going on for several months. She is able to urinate without pain or hematuria, but notes this intermittent urge to urinate without being able to void. Denies urinary incontinence or sensation of incomplete bladder emptying.   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   Otherwise, she is largely without complaints today.    REVIEW OF SYSTEMS:  Review of Systems  Constitutional: Positive for fatigue. Negative for chills and fever.  HENT:  Negative.  Negative for lump/mass and nosebleeds.   Eyes: Negative.   Respiratory: Negative.  Negative for cough and shortness of breath.   Cardiovascular: Negative.  Negative for chest pain  and leg swelling.  Gastrointestinal: Negative.  Negative for abdominal pain, blood in stool, constipation, diarrhea, nausea and vomiting.  Endocrine: Negative.   Genitourinary: Negative.  Negative for dysuria and hematuria.        Per HPI  Musculoskeletal: Positive for myalgias. Negative for arthralgias.  Skin: Negative.  Negative for rash.  Neurological: Positive for headaches. Negative for dizziness.  Hematological: Negative.  Negative for adenopathy. Does not bruise/bleed easily.  Psychiatric/Behavioral: Positive for sleep disturbance. Negative for depression. The patient is not nervous/anxious.      PAST MEDICAL/SURGICAL HISTORY:  Past Medical History:  Diagnosis Date  . Anxiety   . Chronic pain   . Essential hypertension   . 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 History of cardiac catheterization    Normal coronaries November 2016  . History of pneumonia   . Hypothyroidism   . Migraine   . Ovarian cyst   . Spinal stenosis   . Takotsubo syndrome    November 2016  . Thrombocytosis ([place]) 01/24/2016   Past Surgical History:  Procedure Laterality Date  . CARDIAC CATHETERIZATION N/A 02/23/2015   Procedure: Left Heart Cath and Coronary Angiography;  Surgeon: Jettie Booze, MD;  Location: [facility];  Service: Cardiovascular;  Laterality: N/A;  . CHOLECYSTECTOMY    . DOPPLER ECHOCARDIOGRAPHY    . GUM SURGERY     Removed cyst  . LEEP       SOCIAL HISTORY:  Social History   Social History  . Marital status: Married    Spouse name: N/A  . Number of children:  1  . Years of education: HS   Occupational History  . N/A    Social History Main Topics  . Smoking status: Current Every Day Smoker    Packs/day: 0.75    Years: 30.00    Types: Cigarettes  . Smokeless tobacco: Never Used  . Alcohol use 0.0 oz/week  . Drug use: No  . Sexual activity: Not on file   Other Topics Concern  . Not on file   Social History Narrative   She lives with husband and daughter.   She is working as a Radiation protection practitioner.      Patient does not drink caffeine.   Patient is right handed.           FAMILY HISTORY:  Family History  Problem Relation Age of Onset  . Hypertension Father   . Breast cancer Mother        metastatic to brain  . Migraines Sister   . Migraines Brother   . Heart attack Maternal Grandfather   . Seizures Neg Hx     CURRENT MEDICATIONS:  Outpatient Encounter Prescriptions as of 09/24/2016  Medication Sig Note  . ALPRAZolam (XANAX) 1 MG tablet Take 1 mg by mouth 4 (four) times daily as needed for anxiety.    [name] Kitchen aspirin EC 81 MG EC tablet Take 1 tablet (81 mg total) by mouth daily.   [name] Kitchen atorvastatin (LIPITOR) 40 MG tablet Take 1 tablet (40 mg total) by mouth daily at 6 PM.   . BELSOMRA 20 MG TABS    . carvedilol (COREG) 6.25 MG tablet Take 1 tablet (6.25 mg total) by mouth 2 (two) times daily with a meal.   . Diclofenac Sodium (PENNSAID TD) Place 400 mg onto the skin 2 (two) times daily.   . DULoxetine (CYMBALTA) 30 MG capsule Take 30 mg by mouth daily. Take 30 mg capsule along with 60 mg capsule to equal 90 mg daily.   . DULoxetine (CYMBALTA) 60 MG capsule Take 60 mg by mouth daily. Take 60 mg capsule along with a 30 mg capsule to equal 90 mg daily.   . folic acid (FOLVITE) 1 MG tablet TAKE ONE (1) TABLET BY MOUTH EVERY DAY   . furosemide (LASIX) 20 MG tablet Take 20 mg by mouth daily as needed for fluid.   . hydrochlorothiazide (MICROZIDE) 12.5 MG capsule Take 12.5 mg by mouth daily.    [name] Kitchen ibuprofen (ADVIL,MOTRIN) 200 MG tablet Take 400-800 mg by mouth every 6 (six) hours as needed for moderate pain.   [name] Kitchen levothyroxine (SYNTHROID, LEVOTHROID) 88 MCG tablet Take 88 mcg by mouth daily before breakfast.   . Magnesium Salicylate (DOANS PILLS PO) Take 2 tablets by mouth daily as needed (pain).   . Multiple Vitamin (MULTIVITAMIN WITH MINERALS) TABS tablet Take 1 tablet by mouth daily.   . NON FORMULARY z-quil   . olmesartan (BENICAR) 40 MG tablet Take 40 mg  by mouth daily.   [name] Kitchen oxyCODONE (ROXICODONE) 15 MG immediate release tablet Take 15 mg by mouth every 4 (four) hours as needed for pain.   . potassium chloride (K-DUR) 10 MEQ tablet Take 1 tablet (10 mEq total) by mouth 2 (two) times daily. (Patient taking differently: Take 10 mEq by mouth 3 (three) times daily. )   . QUEtiapine (SEROQUEL) 50 MG tablet  07/10/2015: Received from: External Pharmacy  . ROZEREM 8 MG tablet    . zonisamide (ZONEGRAN) 50 MG capsule TAKE 2 CAPSULES IN THE MORNING AND 3 CAPSULES IN THE EVENING  No facility-administered encounter medications on file as of 09/24/2016.     ALLERGIES:  Allergies  Allergen Reactions  . Amitriptyline Itching    Allergy identified by daughter  . Actifed Cold-Allergy [Chlorpheniramine-Phenyleph Er] Other (See Comments)    ALTERS MENTAL STATUS  . Sudafed [Pseudoephedrine Hcl] Other (See Comments)    Climbs walls  . Other Palpitations    Topamax     PHYSICAL EXAM:  ECOG Performance status: 0-1 - Mildly symptomatic with fatigue; remains independent   Vitals:   09/24/16 1337  BP: 133/76  Pulse: 83  Resp: 18  Temp: 99.2 F (37.3 C)   Filed Weights   09/24/16 1337  Weight: 155 lb (70.3 kg)    Physical Exam  Constitutional: She is oriented to person, place, and time and well-developed, well-nourished, and in no distress.  HENT:  Head: Normocephalic.  Mouth/Throat: Oropharynx is clear and moist. No oropharyngeal exudate.  Eyes: Conjunctivae are normal. Pupils are equal, round, and reactive to light. No scleral icterus.  Neck: Normal range of motion. Neck supple.  Cardiovascular: Normal rate, regular rhythm and normal heart sounds.   Pulmonary/Chest: Effort normal. No respiratory distress. She has wheezes (Inspiratory wheezes to bilat bases ).  Abdominal: Soft. Bowel sounds are normal. There is no tenderness.  Musculoskeletal: Normal range of motion. She exhibits no edema.  Lymphadenopathy:    She has no cervical adenopathy.        Right: No supraclavicular adenopathy present.       Left: No supraclavicular adenopathy present.  Neurological: She is alert and oriented to person, place, and time. No cranial nerve deficit. Gait normal.  Skin: Skin is warm and dry. No rash noted.  Psychiatric: Mood, memory, affect and judgment normal.  Nursing note and vitals reviewed.    LABORATORY DATA:  I have reviewed the labs as listed.  CBC    Component Value Date/Time   WBC 7.7 09/24/2016 1435   RBC 3.70 (L) 09/24/2016 1435   HGB 11.6 (L) 09/24/2016 1435   HCT 34.0 (L) 09/24/2016 1435   PLT 413 (H) 09/24/2016 1435   MCV 91.9 09/24/2016 1435   MCH 31.4 09/24/2016 1435   MCHC 34.1 09/24/2016 1435   RDW 13.2 09/24/2016 1435   LYMPHSABS 1.3 09/24/2016 1435   MONOABS 0.4 09/24/2016 1435   EOSABS 0.1 09/24/2016 1435   BASOSABS 0.0 09/24/2016 1435   CMP Latest Ref Rng & Units 02/26/2016 01/24/2016 02/25/2015  Glucose 65 - 99 mg/dL 109(H) 98 119(H)  BUN 6 - 20 mg/dL 12 14 <5(L)  Creatinine 0.44 - 1.00 mg/dL 1.40(H) 1.02(H) 0.65  Sodium 135 - 145 mmol/L 134(L) 121(L) 128(L)  Potassium 3.5 - 5.1 mmol/L 3.1(L) 3.1(L) 3.1(L)  Chloride 101 - 111 mmol/L 100(L) 86(L) 94(L)  CO2 22 - 32 mmol/L 25 25 25   Calcium 8.9 - 10.3 mg/dL 9.1 9.6 9.2  Total Protein 6.5 - 8.1 g/dL 6.8 7.6 -  Total Bilirubin 0.3 - 1.2 mg/dL 0.8 0.5 -  Alkaline Phos 38 - 126 U/L 225(H) 156(H) -  AST 15 - 41 U/L 63(H) 23 -  ALT 14 - 54 U/L 95(H) 49 -       PENDING LABS:    DIAGNOSTIC IMAGING:    PATHOLOGY:     ASSESSMENT & PLAN:   Thrombocytosis/Anemia:  -Myeloproliferative work-up/evaluation has been negative (JAK2 and CALR2 negative). Could be reactive and secondary to iron deficiency. Will collect iron studies today. Ferritin in 01/2016 was 57. If iron studies show iron deficiency, then  would consider starting oral iron replacement therapy with Raeanne Gathers or comparable agent. No recent infections or fevers. Lymphocytosis has generally  been pretty mild in past months with highest platelet count 447,000 in 05/1016.  -She did not have labs today prior to our visit. Therefore, we will collect labs today. Clinically, she is asymptomatic from thrombocytosis. Denies any chest pain, shortness of breath, or concerns for thrombus.  -She would like to know if/when she can be released from follow-up here.  I let her know that without blood work today, it is difficult for me to make a recommendation. However, if her blood counts normalize, and/or stabilize for an extended period of time, then we can discuss lengthening her follow-up visit and consider discharging her with follow-up at her PCP's office.  -Will collect labs today and in 3 months. Will have her return to cancer center in 6 months with labs as well.  -If subsequent lab evaluations are normal, then we can discuss releasing her from follow-up here at the cancer center at her follow-up visit in 6 months. She agrees with this plan.    Leukocytosis:  -Negative BCR/ABL.  -Labs to be collected today after visit. We will contact her with results.   Folate deficiency:  -Continue daily folic acid.   Myalgias/Cramps:  -Not likely to be related to her hematologic abnormalities. Will collect CMET today to evaluate for any electrolyte imbalance.  -Recommended she try conservative OTC measures like tonic water with quinine, mustard, or vinegar to see if her symptoms may improve. Follow-up with PCP if these recommendations do not give satisfactory relief of symptoms.   Urinary concerns:  -Recommended she follow-up with her PCP. She may need referral to urology, but will defer to PCPs judgment on this.    Dispo:  -Labs today.  -Labs only in 3 months.  -Return to cancer center in 6 months with labs. If all is stable/within normal limits, then will consider releasing patient from follow-up here at that time.    All questions were answered to patient's stated satisfaction. Encouraged  patient to call with any new concerns or questions before her next visit to the cancer center and we can certain see her sooner, if needed.    Plan of care discussed with Dr. Talbert Cage, who agrees with the above aforementioned.    Orders placed this encounter:  Orders Placed This Encounter  Procedures  . CBC with Differential/Platelet  . Basic metabolic panel  . Vitamin B12  . Folate  . Iron and TIBC  . Ferritin      Mike Craze, NP [place] [phone]

## 2016-09-24 NOTE — Patient Instructions (Signed)
[facility] at [facility] Discharge Instructions  RECOMMENDATIONS MADE BY THE CONSULTANT AND ANY TEST RESULTS WILL BE SENT TO YOUR REFERRING PHYSICIAN.  You were seen today by Gretchen Dawson NP.   Thank you for choosing [facility] at [facility] to provide your oncology and hematology care.  To afford each patient quality time with our provider, please arrive at least 15 minutes before your scheduled appointment time.    If you have a lab appointment with the Cancer Center please come in thru the  Main Entrance and check in at the main information desk  You need to re-schedule your appointment should you arrive 10 or more minutes late.  We strive to give you quality time with our providers, and arriving late affects you and other patients whose appointments are after yours.  Also, if you no show three or more times for appointments you may be dismissed from the clinic at the providers discretion.     Again, thank you for choosing [facility].  Our hope is that these requests will decrease the amount of time that you wait before being seen by our physicians.       _____________________________________________________________  Should you have questions after your visit to [facility], please contact our office at [phone] between the hours of 8:30 a.m. and 4:30 p.m.  Voicemails left after 4:30 p.m. will not be returned until the following business day.  For prescription refill requests, have your pharmacy contact our office.       Resources For Cancer Patients and their Caregivers ? American Cancer Society: Can assist with transportation, wigs, general needs, runs Look Good Feel Better.        1-888-227-6333 ? Cancer Care: Provides financial assistance, online support groups, medication/co-pay assistance.  1-800-813-HOPE (4673) ? [facility] Assists [place] cancer patients and  their families through emotional , educational and financial support.  336-427-4357 ? [place] DSS Where to apply for food stamps, Medicaid and utility assistance. 336-342-1394 ? RCATS: Transportation to medical appointments. 336-347-2287 ? Social Security Administration: May apply for disability if have a Stage IV cancer. 336-342-7796 1-800-772-1213 ? [place] Aging, Disability and Transit Services: Assists with nutrition, care and transit needs. 336-349-2343  Cancer Center Support Programs: @10RELATIVEDAYS@ > Cancer Support Group  2nd Tuesday of the month 1pm-2pm, Journey Room  > Creative Journey  3rd Tuesday of the month 1130am-1pm, Journey Room  > Look Good Feel Better  1st Wednesday of the month 10am-12 noon, Journey Room (Call American Cancer Society to register 1-800-395-5775)    

## 2016-09-24 NOTE — Telephone Encounter (Signed)
HST ordered as ins. would not approve in lab study/fim

## 2016-09-25 ENCOUNTER — Other Ambulatory Visit (HOSPITAL_COMMUNITY): Payer: Self-pay | Admitting: Oncology

## 2016-09-25 DIAGNOSIS — D649 Anemia, unspecified: Secondary | ICD-10-CM

## 2016-09-25 LAB — FOLATE: FOLATE: 87.6 ng/mL (ref >5.9)

## 2016-09-25 MED ORDER — POLYSACCHAR IRON-FA-B12 150-1-25 MG-MG-MCG PO CAPS: 1.0000 | ORAL_CAPSULE | Freq: Every day | ORAL | 5 refills | Status: DC

## 2016-09-29 DIAGNOSIS — R7303 Prediabetes: Secondary | ICD-10-CM | POA: Diagnosis not present

## 2016-09-29 DIAGNOSIS — E782 Mixed hyperlipidemia: Secondary | ICD-10-CM | POA: Diagnosis not present

## 2016-09-29 DIAGNOSIS — I10 Essential (primary) hypertension: Secondary | ICD-10-CM | POA: Diagnosis not present

## 2016-10-31 DIAGNOSIS — J9801 Acute bronchospasm: Secondary | ICD-10-CM | POA: Diagnosis not present

## 2016-10-31 DIAGNOSIS — M2550 Pain in unspecified joint: Secondary | ICD-10-CM | POA: Diagnosis not present

## 2016-10-31 DIAGNOSIS — J042 Acute laryngotracheitis: Secondary | ICD-10-CM | POA: Diagnosis not present

## 2016-11-03 ENCOUNTER — Other Ambulatory Visit (HOSPITAL_COMMUNITY): Payer: Self-pay | Admitting: Adult Health

## 2016-11-03 DIAGNOSIS — E876 Hypokalemia: Secondary | ICD-10-CM

## 2016-11-03 NOTE — Telephone Encounter (Signed)
Subsequent refills should be completed by PCP.

## 2016-11-20 ENCOUNTER — Telehealth: Payer: Self-pay | Admitting: Neurology

## 2016-11-20 NOTE — Telephone Encounter (Signed)
We have attempted to call the patient 3 times to schedule sleep study. Patient has been unavailable at the phone numbers we have on file and has not returned our calls. At this point we will send a letter asking pt to please contact the sleep lab to schedule their sleep study. If patient calls back we will schedule them for their sleep study. ° °

## 2016-12-08 ENCOUNTER — Other Ambulatory Visit (HOSPITAL_COMMUNITY): Payer: Self-pay | Admitting: Adult Health

## 2016-12-08 DIAGNOSIS — E876 Hypokalemia: Secondary | ICD-10-CM

## 2016-12-08 IMAGING — CT CT HEAD W/O CM
1 series · 16 of 30 positions shown, 20 images · non-contrast
Comparison: 10/19/2013 and prior exams

CLINICAL DATA: 59-year-old female acute altered mental status and
possible seizure today.

EXAM:
CT HEAD WITHOUT CONTRAST
TECHNIQUE: Contiguous axial images were obtained from the base of the skull
through the vertex without intravenous contrast.

[Series 2: headtrauma 4.8 h37s · axial · 0.43mm/px · z∈[-275,-147]mm · 16 of 30 slices shown, 20 images]
[im 2/30  brain]
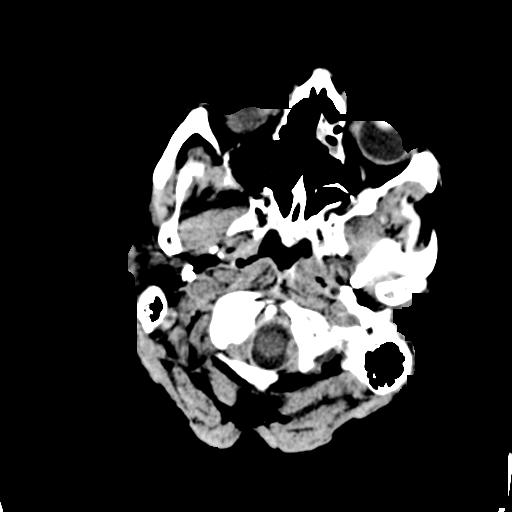
[im 2/30  bone]
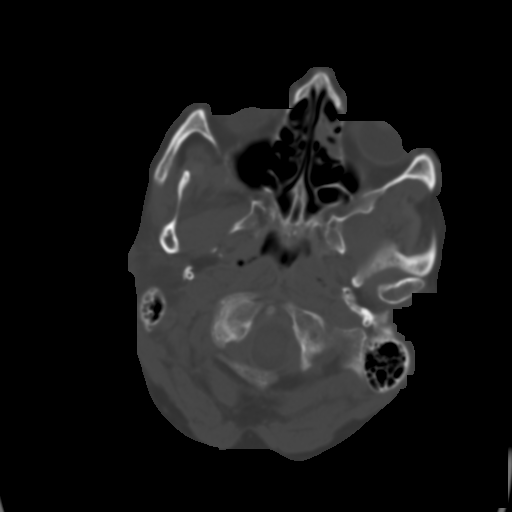
[im 4/30  brain]
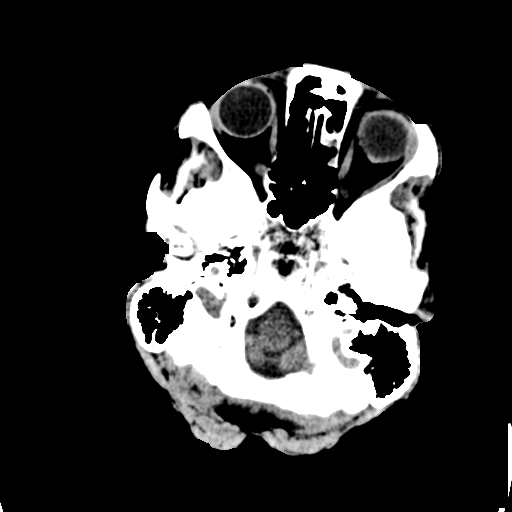
[im 6/30  brain]
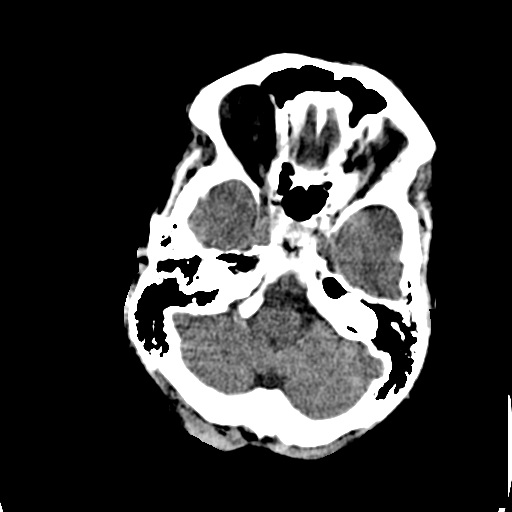
[im 8/30  brain]
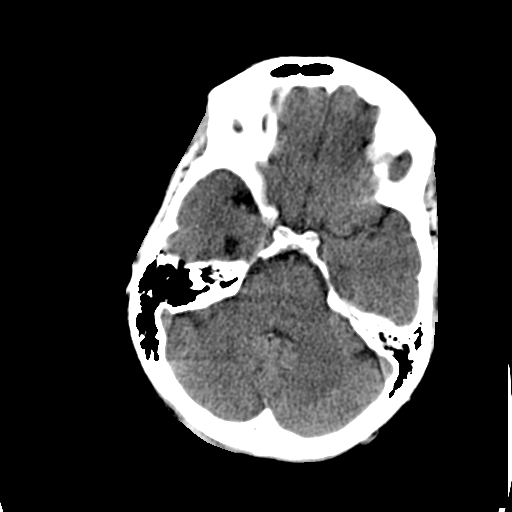
[im 9/30  brain]
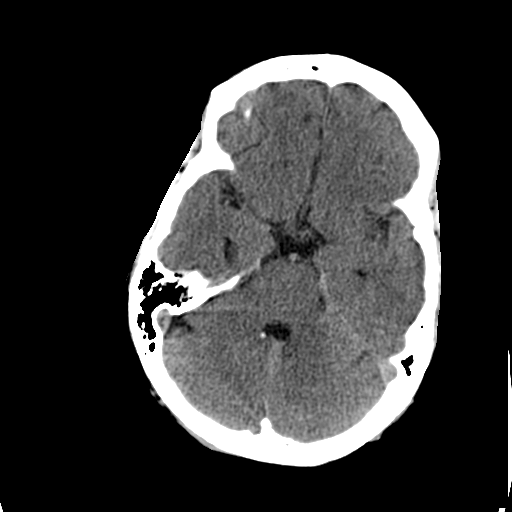
[im 9/30  bone]
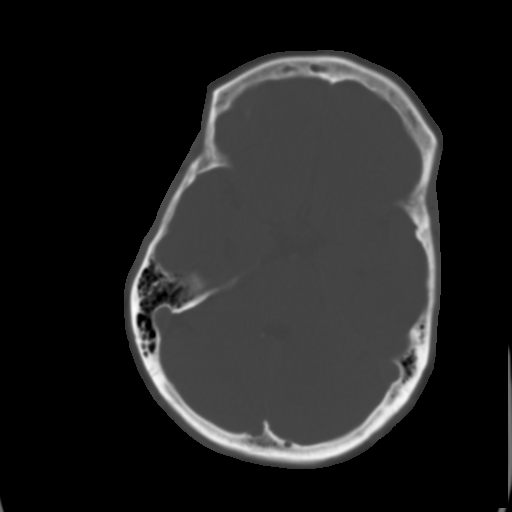
[im 11/30  brain]
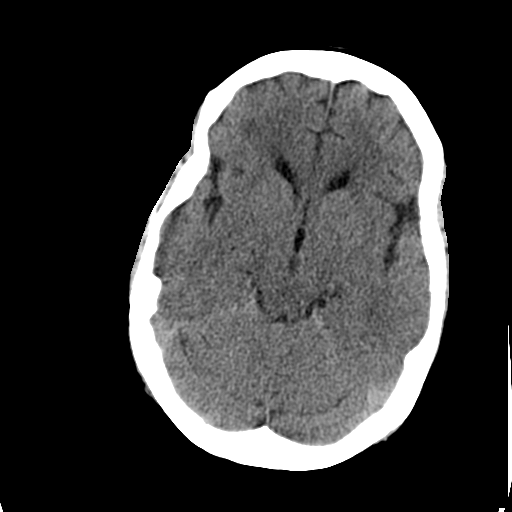
[im 13/30  brain]
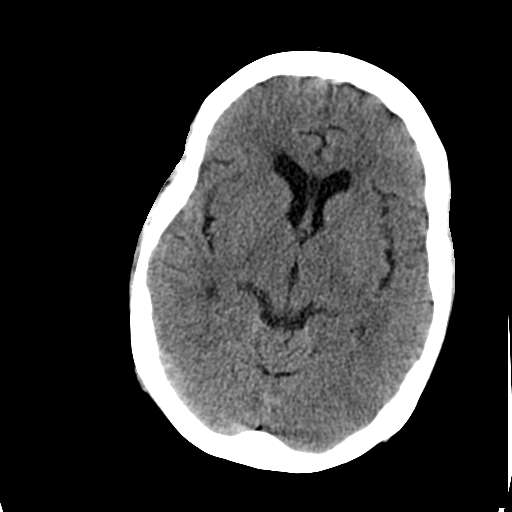
[im 15/30  brain]
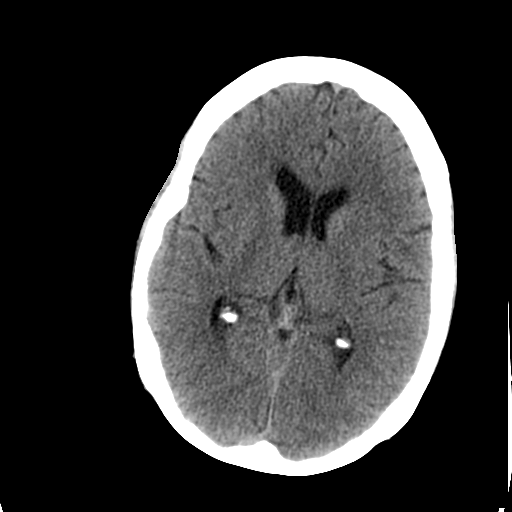
[im 16/30  brain]
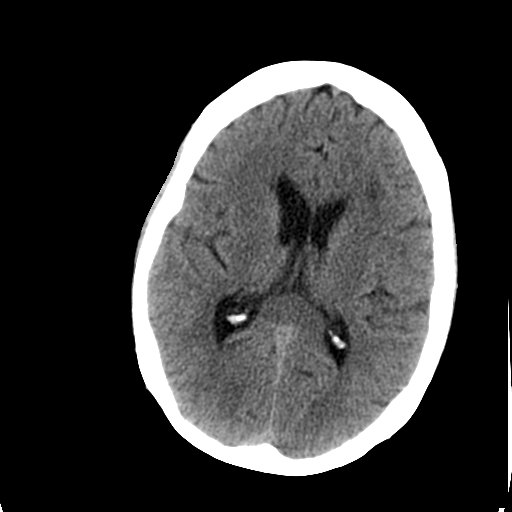
[im 16/30  bone]
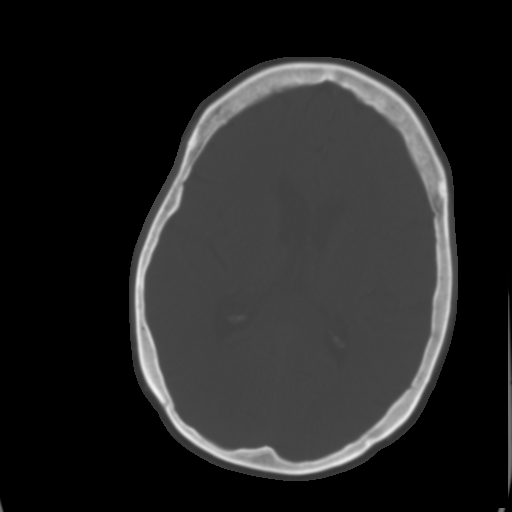
[im 18/30  brain]
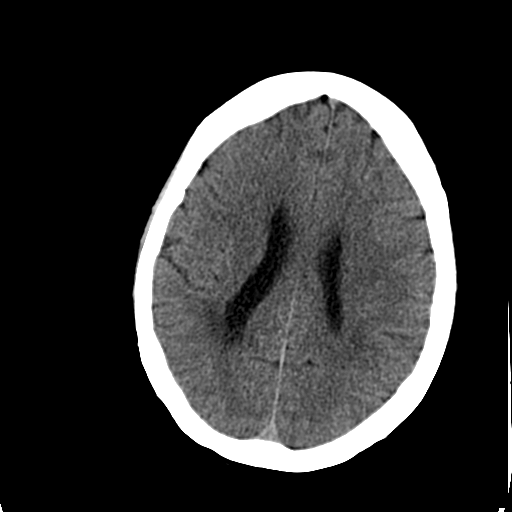
[im 20/30  brain]
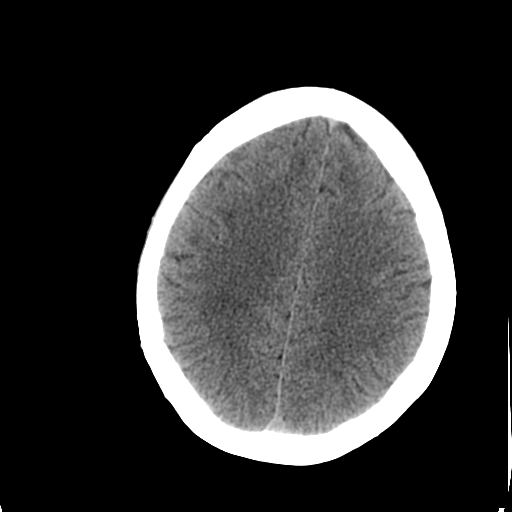
[im 22/30  brain]
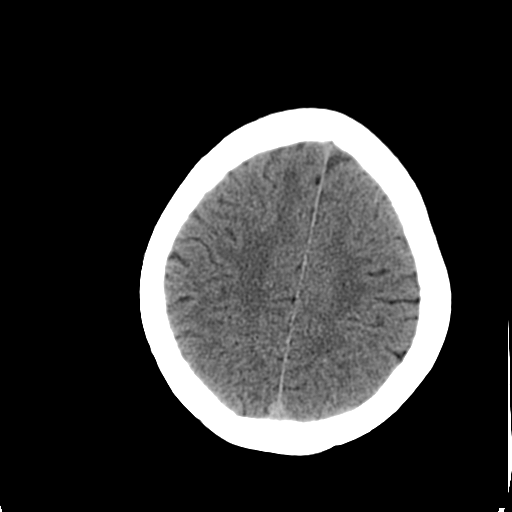
[im 23/30  brain]
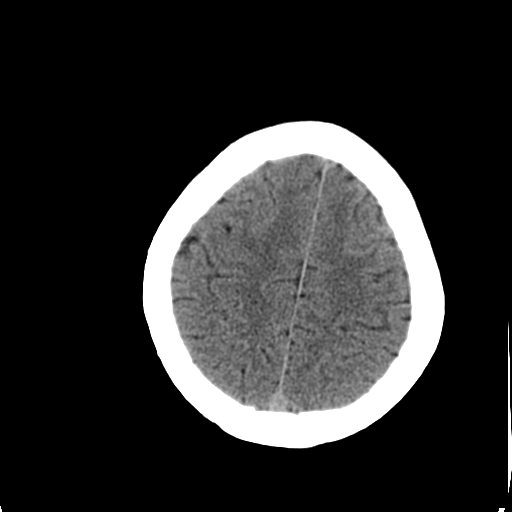
[im 23/30  bone]
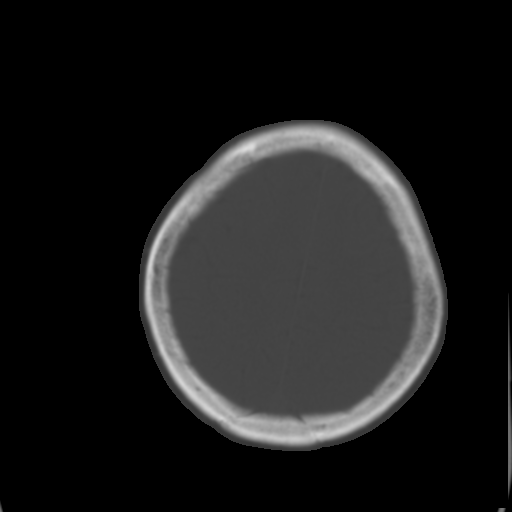
[im 25/30  brain]
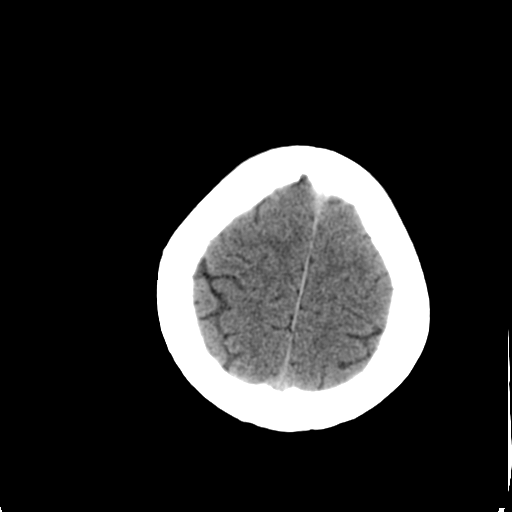
[im 27/30  brain]
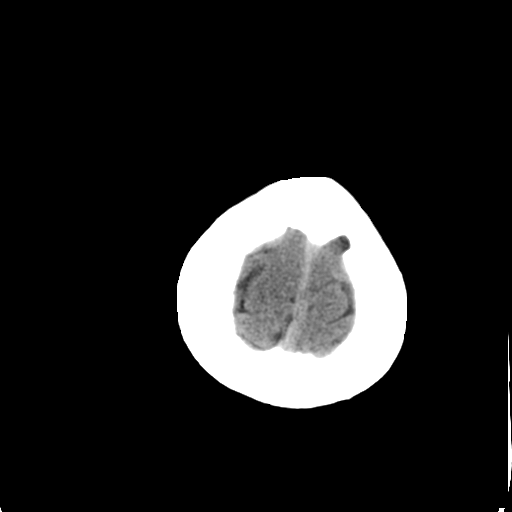
[im 29/30  brain]
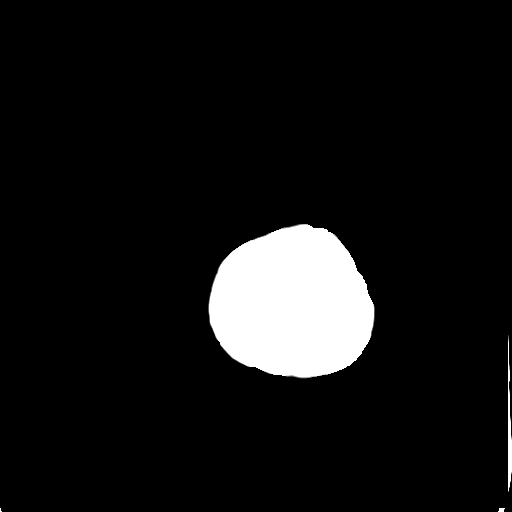

[16 of 30 positions shown; findings below may reference images not displayed]

FINDINGS: White matter hypodensities are unchanged.

No acute intracranial abnormalities are identified, including mass
lesion or mass effect, hydrocephalus, extra-axial fluid collection,
midline shift, hemorrhage, or acute infarction.

The visualized bony calvarium is unremarkable.
IMPRESSION: No evidence of acute intracranial abnormality.

Chronic white matter disease.

## 2016-12-25 ENCOUNTER — Other Ambulatory Visit (HOSPITAL_COMMUNITY): Payer: Commercial Managed Care - HMO

## 2017-01-22 DIAGNOSIS — M545 Low back pain: Secondary | ICD-10-CM | POA: Diagnosis not present

## 2017-01-22 DIAGNOSIS — Z79891 Long term (current) use of opiate analgesic: Secondary | ICD-10-CM | POA: Diagnosis not present

## 2017-01-23 ENCOUNTER — Other Ambulatory Visit (HOSPITAL_COMMUNITY): Payer: Self-pay

## 2017-01-23 DIAGNOSIS — E538 Deficiency of other specified B group vitamins: Secondary | ICD-10-CM

## 2017-01-23 MED ORDER — FOLIC ACID 1 MG PO TABS: ORAL_TABLET | ORAL | 1 refills | Status: DC

## 2017-01-23 NOTE — Telephone Encounter (Signed)
Received refill request from patients pharmacy for 90 day supply of folic acid. Reviewed with provider, chart checked and refilled.

## 2017-01-26 ENCOUNTER — Other Ambulatory Visit (HOSPITAL_COMMUNITY): Payer: Self-pay | Admitting: Emergency Medicine

## 2017-01-26 ENCOUNTER — Telehealth (HOSPITAL_COMMUNITY): Payer: Self-pay | Admitting: *Deleted

## 2017-01-26 DIAGNOSIS — E538 Deficiency of other specified B group vitamins: Secondary | ICD-10-CM

## 2017-01-26 MED ORDER — FOLIC ACID 1 MG PO TABS: ORAL_TABLET | ORAL | 1 refills | Status: DC

## 2017-01-29 ENCOUNTER — Telehealth: Payer: Self-pay | Admitting: Neurology

## 2017-01-29 ENCOUNTER — Other Ambulatory Visit (HOSPITAL_COMMUNITY): Payer: Commercial Managed Care - HMO

## 2017-01-29 ENCOUNTER — Ambulatory Visit: Payer: Commercial Managed Care - HMO | Admitting: Neurology

## 2017-01-29 NOTE — Telephone Encounter (Signed)
This patient did not show for a revisit appointment today. 

## 2017-02-05 ENCOUNTER — Other Ambulatory Visit (HOSPITAL_COMMUNITY): Payer: Self-pay | Admitting: Adult Health

## 2017-02-05 DIAGNOSIS — E876 Hypokalemia: Secondary | ICD-10-CM

## 2017-02-14 ENCOUNTER — Other Ambulatory Visit: Payer: Self-pay | Admitting: Adult Health

## 2017-02-16 ENCOUNTER — Ambulatory Visit: Payer: Commercial Managed Care - HMO | Admitting: Neurology

## 2017-02-18 ENCOUNTER — Other Ambulatory Visit (HOSPITAL_COMMUNITY): Payer: Commercial Managed Care - HMO

## 2017-02-18 ENCOUNTER — Other Ambulatory Visit: Payer: Self-pay | Admitting: Adult Health

## 2017-03-02 DIAGNOSIS — H6690 Otitis media, unspecified, unspecified ear: Secondary | ICD-10-CM | POA: Diagnosis not present

## 2017-03-02 DIAGNOSIS — H6983 Other specified disorders of Eustachian tube, bilateral: Secondary | ICD-10-CM | POA: Diagnosis not present

## 2017-03-09 ENCOUNTER — Other Ambulatory Visit (HOSPITAL_COMMUNITY): Payer: Self-pay | Admitting: Adult Health

## 2017-03-09 DIAGNOSIS — E876 Hypokalemia: Secondary | ICD-10-CM

## 2017-03-16 ENCOUNTER — Other Ambulatory Visit (HOSPITAL_COMMUNITY): Payer: Self-pay | Admitting: Oncology

## 2017-03-16 DIAGNOSIS — D649 Anemia, unspecified: Secondary | ICD-10-CM

## 2017-03-19 DIAGNOSIS — G894 Chronic pain syndrome: Secondary | ICD-10-CM | POA: Diagnosis not present

## 2017-03-26 ENCOUNTER — Encounter (HOSPITAL_COMMUNITY): Payer: 59

## 2017-03-26 ENCOUNTER — Other Ambulatory Visit: Payer: Self-pay

## 2017-03-26 ENCOUNTER — Encounter (HOSPITAL_COMMUNITY): Payer: 59 | Attending: Oncology | Admitting: Oncology

## 2017-03-26 ENCOUNTER — Encounter (HOSPITAL_COMMUNITY): Payer: Self-pay | Admitting: Oncology

## 2017-03-26 VITALS — BP 97/49 | HR 64 | Temp 98.1°F | Resp 18 | Wt 153.0 lb

## 2017-03-26 DIAGNOSIS — Z9049 Acquired absence of other specified parts of digestive tract: Secondary | ICD-10-CM | POA: Diagnosis not present

## 2017-03-26 DIAGNOSIS — I10 Essential (primary) hypertension: Secondary | ICD-10-CM | POA: Diagnosis not present

## 2017-03-26 DIAGNOSIS — F17210 Nicotine dependence, cigarettes, uncomplicated: Secondary | ICD-10-CM | POA: Diagnosis not present

## 2017-03-26 DIAGNOSIS — D75839 Thrombocytosis, unspecified: Secondary | ICD-10-CM

## 2017-03-26 DIAGNOSIS — E039 Hypothyroidism, unspecified: Secondary | ICD-10-CM | POA: Diagnosis not present

## 2017-03-26 DIAGNOSIS — Z79890 Hormone replacement therapy (postmenopausal): Secondary | ICD-10-CM | POA: Insufficient documentation

## 2017-03-26 DIAGNOSIS — D72828 Other elevated white blood cell count: Secondary | ICD-10-CM

## 2017-03-26 DIAGNOSIS — F419 Anxiety disorder, unspecified: Secondary | ICD-10-CM | POA: Insufficient documentation

## 2017-03-26 DIAGNOSIS — D72818 Other decreased white blood cell count: Secondary | ICD-10-CM

## 2017-03-26 DIAGNOSIS — D473 Essential (hemorrhagic) thrombocythemia: Secondary | ICD-10-CM

## 2017-03-26 DIAGNOSIS — D649 Anemia, unspecified: Secondary | ICD-10-CM | POA: Insufficient documentation

## 2017-03-26 DIAGNOSIS — E538 Deficiency of other specified B group vitamins: Secondary | ICD-10-CM

## 2017-03-26 DIAGNOSIS — Z7982 Long term (current) use of aspirin: Secondary | ICD-10-CM | POA: Diagnosis not present

## 2017-03-26 DIAGNOSIS — Z79899 Other long term (current) drug therapy: Secondary | ICD-10-CM | POA: Diagnosis not present

## 2017-03-26 DIAGNOSIS — E876 Hypokalemia: Secondary | ICD-10-CM | POA: Insufficient documentation

## 2017-03-26 DIAGNOSIS — D72829 Elevated white blood cell count, unspecified: Secondary | ICD-10-CM | POA: Diagnosis not present

## 2017-03-26 DIAGNOSIS — E871 Hypo-osmolality and hyponatremia: Secondary | ICD-10-CM | POA: Insufficient documentation

## 2017-03-26 LAB — BASIC METABOLIC PANEL
ANION GAP: 9 (ref 5–15)
BUN: 14 mg/dL (ref 6–20)
CALCIUM: 9 mg/dL (ref 8.9–10.3)
CO2: 24 mmol/L (ref 22–32)
Chloride: 93 mmol/L — ABNORMAL LOW (ref 101–111)
Creatinine, Ser: 0.74 mg/dL (ref 0.44–1.00)
GLUCOSE: 111 mg/dL — AB (ref 65–99)
Potassium: 4 mmol/L (ref 3.5–5.1)
SODIUM: 126 mmol/L — AB (ref 135–145)

## 2017-03-26 LAB — CBC WITH DIFFERENTIAL/PLATELET
BASOS PCT: 0 %
Basophils Absolute: 0 10*3/uL (ref 0.0–0.1)
Eosinophils Absolute: 0.2 10*3/uL (ref 0.0–0.7)
Eosinophils Relative: 1 %
HCT: 37.8 % (ref 36.0–46.0)
HEMOGLOBIN: 12.4 g/dL (ref 12.0–15.0)
LYMPHS ABS: 1.6 10*3/uL (ref 0.7–4.0)
Lymphocytes Relative: 14 %
MCH: 31.5 pg (ref 26.0–34.0)
MCHC: 32.8 g/dL (ref 30.0–36.0)
MCV: 95.9 fL (ref 78.0–100.0)
Monocytes Absolute: 0.6 10*3/uL (ref 0.1–1.0)
Monocytes Relative: 5 %
NEUTROS PCT: 80 %
Neutro Abs: 9.2 10*3/uL — ABNORMAL HIGH (ref 1.7–7.7)
Platelets: 360 10*3/uL (ref 150–400)
RBC: 3.94 MIL/uL (ref 3.87–5.11)
RDW: 13.8 % (ref 11.5–15.5)
WBC: 11.7 10*3/uL — AB (ref 4.0–10.5)

## 2017-03-26 LAB — IRON AND TIBC
Iron: 78 ug/dL (ref 28–170)
Saturation Ratios: 22 % (ref 10.4–31.8)
TIBC: 357 ug/dL (ref 250–450)
UIBC: 279 ug/dL

## 2017-03-26 LAB — VITAMIN B12: VITAMIN B 12: 913 pg/mL (ref 180–914)

## 2017-03-26 LAB — FERRITIN: Ferritin: 63 ng/mL (ref 11–307)

## 2017-03-26 NOTE — Patient Instructions (Signed)
[facility] at [facility] Discharge Instructions  RECOMMENDATIONS MADE BY THE CONSULTANT AND ANY TEST RESULTS WILL BE SENT TO YOUR REFERRING PHYSICIAN.  You were seen today by Dr. Louise Zhou Follow up in 4 months with labs   Thank you for choosing [facility] at [facility] to provide your oncology and hematology care.  To afford each patient quality time with our provider, please arrive at least 15 minutes before your scheduled appointment time.    If you have a lab appointment with the Cancer Center please come in thru the  Main Entrance and check in at the main information desk  You need to re-schedule your appointment should you arrive 10 or more minutes late.  We strive to give you quality time with our providers, and arriving late affects you and other patients whose appointments are after yours.  Also, if you no show three or more times for appointments you may be dismissed from the clinic at the providers discretion.     Again, thank you for choosing [facility].  Our hope is that these requests will decrease the amount of time that you wait before being seen by our physicians.       _____________________________________________________________  Should you have questions after your visit to [facility], please contact our office at [phone] between the hours of 8:30 a.m. and 4:30 p.m.  Voicemails left after 4:30 p.m. will not be returned until the following business day.  For prescription refill requests, have your pharmacy contact our office.       Resources For Cancer Patients and their Caregivers ? American Cancer Society: Can assist with transportation, wigs, general needs, runs Look Good Feel Better.        1-888-227-6333 ? Cancer Care: Provides financial assistance, online support groups, medication/co-pay assistance.  1-800-813-HOPE (4673) ? [facility] Assists  [place] cancer patients and their families through emotional , educational and financial support.  336-427-4357 ? [place] DSS Where to apply for food stamps, Medicaid and utility assistance. 336-342-1394 ? RCATS: Transportation to medical appointments. 336-347-2287 ? Social Security Administration: May apply for disability if have a Stage IV cancer. 336-342-7796 1-800-772-1213 ? [place] Aging, Disability and Transit Services: Assists with nutrition, care and transit needs. 336-349-2343  Cancer Center Support Programs: @[email or web address]@ > Cancer Support Group  2nd Tuesday of the month 1pm-2pm, Journey Room  > Creative Journey  3rd Tuesday of the month 1130am-1pm, Journey Room  > Look Good Feel Better  1st Wednesday of the month 10am-12 noon, Journey Room (Call American Cancer Society to register 1-800-395-5775)    

## 2017-03-26 NOTE — Progress Notes (Signed)
[facility] NOTE  Patient Care Team: Redmond School, MD as PCP - General (Internal Medicine) Clifton James, MD as Referring Physician (Internal Medicine)  CHIEF COMPLAINTS/PURPOSE OF CONSULTATION:  Thrombocytosis  HISTORY OF PRESENTING ILLNESS:  Rita Stephens 61 y.o. female is here for a follow up of thrombocytosis.  She states that she has been doing well. She has been having bouts of colds and have taken antibiotics but still has residual congestion. Otherwise she denies any associated fevers/chills, or shortness of breath. She has some fatigue. She denies any unexplained weight loss, night sweats, chest pain, abdominal pain, N/V/D, focal weakness.   MEDICAL HISTORY:  Past Medical History:  Diagnosis Date  . Anxiety   . Chronic pain   . Essential hypertension   . 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 History of cardiac catheterization    Normal coronaries November 2016  . History of pneumonia   . Hypothyroidism   . Migraine   . Ovarian cyst   . Spinal stenosis   . Takotsubo syndrome    November 2016  . Thrombocytosis ([place]) 01/24/2016    SURGICAL HISTORY: Past Surgical History:  Procedure Laterality Date  . CARDIAC CATHETERIZATION N/A 02/23/2015   Procedure: Left Heart Cath and Coronary Angiography;  Surgeon: Jettie Booze, MD;  Location: [facility];  Service: Cardiovascular;  Laterality: N/A;  . CHOLECYSTECTOMY    . DOPPLER ECHOCARDIOGRAPHY    . GUM SURGERY     Removed cyst  . LEEP      SOCIAL HISTORY: Social History   Socioeconomic History  . Marital status: Married    Spouse name: Not on file  . Number of children: 1  . Years of education: HS  . Highest education level: Not on file  Social Needs  . Financial resource strain: Not on file  . Food insecurity - worry: Not on file  . Food insecurity - inability: Not on file  . Transportation needs - medical: Not on file  . Transportation needs - non-medical: Not on file    Occupational History  . Occupation: N/A  Tobacco Use  . Smoking status: Current Every Day Smoker    Packs/day: 0.75    Years: 30.00    Pack years: 22.50    Types: Cigarettes  . Smokeless tobacco: Never Used  Substance and Sexual Activity  . Alcohol use: Yes    Alcohol/week: 0.0 oz  . Drug use: No  . Sexual activity: Not on file  Other Topics Concern  . Not on file  Social History Narrative   She lives with husband and daughter.  She is working as a Radiation protection practitioner.      Patient does not drink caffeine.   Patient is right handed.       Born in [place], [place]. Husband had leukemia but is cured. They have 2 children.  Doesn't work outside home She currently doesn't smoke, but is a former smoker (20 years- 1 pack a day) She does not drink alcohol She enjoys to read  FAMILY HISTORY: Family History  Problem Relation Age of Onset  . Hypertension Father   . Breast cancer Mother        metastatic to brain  . Migraines Sister   . Migraines Brother   . Heart attack Maternal Grandfather   . Seizures Neg Hx    Parents deceased; Mom deceased at 43 years old. She died of breast cancer that mestasised to brain. Dad deceased at 46. Cause of  death unknown. 1 brother, 2 half brothers, 1 sister alive, 1 sister that died of 4 years old Emmy and all her siblings suffer from migraines.    ALLERGIES:  is allergic to amitriptyline; actifed cold-allergy [chlorpheniramine-phenyleph er]; sudafed [pseudoephedrine hcl]; and other.  MEDICATIONS:  Current Outpatient Medications  Medication Sig Dispense Refill  . ALPRAZolam (XANAX) 1 MG tablet Take 1 mg by mouth 4 (four) times daily as needed for anxiety.     [name] Kitchen aspirin EC 81 MG EC tablet Take 1 tablet (81 mg total) by mouth daily.    [name] Kitchen atorvastatin (LIPITOR) 40 MG tablet Take 1 tablet (40 mg total) by mouth daily at 6 PM. 30 tablet 11  . BELSOMRA 20 MG TABS     . carvedilol (COREG) 6.25 MG tablet Take 1 tablet (6.25 mg total) by mouth 2 (two)  times daily with a meal. 60 tablet 11  . Diclofenac Sodium (PENNSAID TD) Place 400 mg onto the skin 2 (two) times daily.    . DULoxetine (CYMBALTA) 30 MG capsule Take 30 mg by mouth daily. Take 30 mg capsule along with 60 mg capsule to equal 90 mg daily.    . DULoxetine (CYMBALTA) 60 MG capsule Take 60 mg by mouth daily. Take 60 mg capsule along with a 30 mg capsule to equal 90 mg daily.    . folic acid (FOLVITE) 1 MG tablet TAKE ONE (1) TABLET BY MOUTH EVERY DAY 90 tablet 1  . furosemide (LASIX) 20 MG tablet Take 20 mg by mouth daily as needed for fluid.    . hydrochlorothiazide (MICROZIDE) 12.5 MG capsule Take 12.5 mg by mouth daily.     [name] Kitchen ibuprofen (ADVIL,MOTRIN) 200 MG tablet Take 400-800 mg by mouth every 6 (six) hours as needed for moderate pain.    [name] Kitchen levothyroxine (SYNTHROID, LEVOTHROID) 88 MCG tablet Take 88 mcg by mouth daily before breakfast.    . Magnesium Salicylate (DOANS PILLS PO) Take 2 tablets by mouth daily as needed (pain).    . Multiple Vitamin (MULTIVITAMIN WITH MINERALS) TABS tablet Take 1 tablet by mouth daily.    . NON FORMULARY z-quil    . olmesartan (BENICAR) 40 MG tablet Take 40 mg by mouth daily.    [name] Kitchen oxyCODONE (ROXICODONE) 15 MG immediate release tablet Take 15 mg by mouth every 4 (four) hours as needed for pain.    [name] Kitchen POLY-IRON 150 FORTE 150-25-1 MG-MCG-MG CAPS TAKE ONE (1) CAPSULE EACH DAY 30 each 3  . potassium chloride (K-DUR) 10 MEQ tablet TAKE ONE TABLET BY MOUTH TWICE A DAY 60 tablet 0  . QUEtiapine (SEROQUEL) 50 MG tablet     . ROZEREM 8 MG tablet     . zonisamide (ZONEGRAN) 50 MG capsule TAKE TWO CAPSULES IN THE MORNING AND THREE CAPSULES IN THE EVENING 150 capsule 1   No current facility-administered medications for this visit.     Review of Systems  Constitutional: Positive for malaise/fatigue.  HENT: Negative.   Eyes: Negative.   Respiratory: Negative.   Cardiovascular: Negative.   Gastrointestinal: Negative.   Genitourinary: Negative.     Musculoskeletal: Negative for back pain, falls and neck pain.  Skin: Negative.   Neurological: Negative.   Endo/Heme/Allergies: Negative.   Psychiatric/Behavioral: Negative.   All other systems reviewed and are negative. 14 point ROS was done and is otherwise as detailed above or in HPI  PHYSICAL EXAMINATION: ECOG PERFORMANCE STATUS: 0 - Asymptomatic  Blood pressure 97/59, pulse 71, respiratory rate 18, temp 98.4, O2  sat 100%, weight 159.9 pounds.  Physical Exam  Constitutional: She is oriented to person, place, and time and well-developed, well-nourished, and in no distress.  HENT:  Head: Normocephalic and atraumatic.  Nose: Nose normal.  Mouth/Throat: Oropharynx is clear and moist. No oropharyngeal exudate.  Eyes: Conjunctivae and EOM are normal. Pupils are equal, round, and reactive to light. Right eye exhibits no discharge. Left eye exhibits no discharge. No scleral icterus.  Neck: Normal range of motion. Neck supple. No tracheal deviation present. No thyromegaly present.  Cardiovascular: Normal rate, regular rhythm and normal heart sounds. Exam reveals no gallop and no friction rub.  No murmur heard. Pulmonary/Chest: Effort normal and breath sounds normal. She has no wheezes. She has no rales.  Abdominal: Soft. Bowel sounds are normal. She exhibits no distension and no mass. There is no tenderness. There is no rebound and no guarding.  Musculoskeletal: Normal range of motion. She exhibits no edema.  Lymphadenopathy:    She has no cervical adenopathy.  Neurological: She is alert and oriented to person, place, and time. She has normal reflexes. No cranial nerve deficit. Gait normal. Coordination normal.  Skin: Skin is warm and dry. No rash noted.  Psychiatric: Mood, memory, affect and judgment normal.  Nursing note and vitals reviewed.   LABORATORY DATA:  I have reviewed the data as listed Lab Results  Component Value Date   WBC 11.7 (H) 03/26/2017   HGB 12.4 03/26/2017    HCT 37.8 03/26/2017   MCV 95.9 03/26/2017   PLT 360 03/26/2017   CMP     Component Value Date/Time   NA 126 (L) 03/26/2017 1402   K 4.0 03/26/2017 1402   CL 93 (L) 03/26/2017 1402   CO2 24 03/26/2017 1402   GLUCOSE 111 (H) 03/26/2017 1402   BUN 14 03/26/2017 1402   CREATININE 0.74 03/26/2017 1402   CALCIUM 9.0 03/26/2017 1402   PROT 6.8 02/26/2016 1147   ALBUMIN 3.8 02/26/2016 1147   AST 63 (H) 02/26/2016 1147   ALT 95 (H) 02/26/2016 1147   ALKPHOS 225 (H) 02/26/2016 1147   BILITOT 0.8 02/26/2016 1147   GFRNONAA >60 03/26/2017 1402   GFRAA >60 03/26/2017 1402             RADIOGRAPHIC STUDIES: I have personally reviewed the radiological images as listed and agreed with the findings in the report. No results found.  ASSESSMENT & PLAN:  Thrombocytosis Anemia History elevated LFT's Hyponatremia Hypokalemia Folate deficiency  JAK2 with reflex to MPL, CALR, exon 12 were negative. Reviewed her labs with her in detail. She has a mild leukocytosis with WBC 11.7k but otherwise hemoglobin and platelets are WNL. Continue to monitor her labwork.  Orders Placed This Encounter  Procedures  . CBC with Differential    Standing Status:   Future    Standing Expiration Date:   03/26/2018  . Comprehensive metabolic panel    Standing Status:   Future    Standing Expiration Date:   03/26/2018   Follow up with patient in 4 months with repeat CBC, CMP.   All questions were answered. The patient knows to call the clinic with any problems, questions or concerns.  This note was electronically signed.    Twana First, MD  03/26/2017 2:49 PM

## 2017-03-27 LAB — FOLATE: Folate: >100 ng/mL (ref >5.9)

## 2017-03-29 ENCOUNTER — Telehealth: Payer: Self-pay | Admitting: *Deleted

## 2017-03-29 NOTE — Telephone Encounter (Signed)
Called pt to r/s appt tomorrow since office closed d/t inclement weather. Could not reach on home, phone kept ringing and did not go to VM. LVM on Mobile letting her know appt cx and to call back to r/s once office open either Tuesday or Wednesday. Gave [facility] phone number. ELK 03/29/17

## 2017-03-30 ENCOUNTER — Ambulatory Visit: Payer: Commercial Managed Care - HMO | Admitting: Neurology

## 2017-03-30 NOTE — Telephone Encounter (Signed)
Called and LVM for pt again letting her know appt was cx today since office closed. Was trying her again to try and get her appt r/s. Asked her to call back Wednesday to r/s her appt at [phone].

## 2017-04-06 ENCOUNTER — Other Ambulatory Visit (HOSPITAL_COMMUNITY): Payer: Self-pay | Admitting: Adult Health

## 2017-04-06 DIAGNOSIS — E876 Hypokalemia: Secondary | ICD-10-CM

## 2017-04-13 ENCOUNTER — Other Ambulatory Visit: Payer: Self-pay | Admitting: Neurology

## 2017-04-23 DIAGNOSIS — K219 Gastro-esophageal reflux disease without esophagitis: Secondary | ICD-10-CM | POA: Diagnosis not present

## 2017-04-23 DIAGNOSIS — I10 Essential (primary) hypertension: Secondary | ICD-10-CM | POA: Diagnosis not present

## 2017-05-04 ENCOUNTER — Other Ambulatory Visit (HOSPITAL_COMMUNITY): Payer: Self-pay | Admitting: Adult Health

## 2017-05-04 DIAGNOSIS — E876 Hypokalemia: Secondary | ICD-10-CM

## 2017-05-13 ENCOUNTER — Other Ambulatory Visit: Payer: Self-pay | Admitting: Neurology

## 2017-05-13 ENCOUNTER — Ambulatory Visit: Payer: Commercial Managed Care - HMO | Admitting: Neurology

## 2017-05-20 ENCOUNTER — Telehealth: Payer: Self-pay | Admitting: Neurology

## 2017-05-20 NOTE — Telephone Encounter (Signed)
We have attempted to call the patient two times to schedule sleep study. Patient has been unavailable at the phone numbers we have on file, and has not returned our calls. At this time, we will send a letter asking patient to please contact the sleep lab.  °

## 2017-05-27 ENCOUNTER — Encounter: Payer: Self-pay | Admitting: Neurology

## 2017-05-27 ENCOUNTER — Telehealth: Payer: Self-pay | Admitting: Neurology

## 2017-05-27 ENCOUNTER — Ambulatory Visit: Payer: 59 | Admitting: Neurology

## 2017-05-27 VITALS — BP 89/62 | HR 96 | Ht 62.0 in | Wt 156.0 lb

## 2017-05-27 DIAGNOSIS — R569 Unspecified convulsions: Secondary | ICD-10-CM | POA: Diagnosis not present

## 2017-05-27 DIAGNOSIS — G43009 Migraine without aura, not intractable, without status migrainosus: Secondary | ICD-10-CM

## 2017-05-27 MED ORDER — ZONISAMIDE 50 MG PO CAPS: 150.0000 mg | ORAL_CAPSULE | Freq: Two times a day (BID) | ORAL | 3 refills | Status: DC

## 2017-05-27 NOTE — Progress Notes (Signed)
Reason for visit: Migraine headache, seizures  Rita Stephens is an 62 y.o. female  History of present illness:  Rita Stephens is a 80 year old right-handed white female with a history of seizures, the last seizure occurred about 4 years ago.  The patient has done well on Zonegran taking 100 mg in the morning and 150 mg in the evening.  She tolerates medication well.  She has a history of migraine headaches that have increased in frequency over the last 4 or 5 months.  The patient is now having about 2 headaches a week, but the headaches may last a day and a half.  The patient has photophobia, phonophobia and some nausea with the headache.  She does not vomit.  She indicates that weather changes may bring on headache.  Certain types of perfumes may also initiate headache.  She also has occasional tremors that may come and go at times, she has had these tremors for many years.  She returns to this office for an evaluation.  Past Medical History:  Diagnosis Date  . Anxiety   . Chronic pain   . Essential hypertension   . 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 History of cardiac catheterization    Normal coronaries November 2016  . History of pneumonia   . Hypothyroidism   . Migraine   . Ovarian cyst   . Spinal stenosis   . Takotsubo syndrome    November 2016  . Thrombocytosis ([place]) 01/24/2016    Past Surgical History:  Procedure Laterality Date  . CARDIAC CATHETERIZATION N/A 02/23/2015   Procedure: Left Heart Cath and Coronary Angiography;  Surgeon: Jettie Booze, MD;  Location: [facility];  Service: Cardiovascular;  Laterality: N/A;  . CHOLECYSTECTOMY    . DOPPLER ECHOCARDIOGRAPHY    . GUM SURGERY     Removed cyst  . LEEP      Family History  Problem Relation Age of Onset  . Hypertension Father   . Breast cancer Mother        metastatic to brain  . Migraines Sister   . Migraines Brother   . Heart attack Maternal Grandfather   . Seizures Neg Hx     Social history:   reports that she has been smoking cigarettes.  She has a 22.50 pack-year smoking history. she has never used smokeless tobacco. She reports that she drinks alcohol. She reports that she does not use drugs.    Allergies  Allergen Reactions  . Amitriptyline Itching    Allergy identified by daughter  . Actifed Cold-Allergy [Chlorpheniramine-Phenyleph Er] Other (See Comments)    ALTERS MENTAL STATUS  . Sudafed [Pseudoephedrine Hcl] Other (See Comments)    Climbs walls  . Other Palpitations    Topamax    Medications:  Prior to Admission medications   Medication Sig Start Date End Date Taking? Authorizing Provider  ALPRAZolam Duanne Moron) 1 MG tablet Take 1 mg by mouth 4 (four) times daily as needed for anxiety.    Yes [provider]  aspirin EC 81 MG EC tablet Take 1 tablet (81 mg total) by mouth daily. 02/25/15  Yes Bhagat, Bhavinkumar, PA  atorvastatin (LIPITOR) 40 MG tablet Take 1 tablet (40 mg total) by mouth daily at 6 PM. 02/25/15  Yes Bhagat, Bhavinkumar, PA  BELSOMRA 20 MG TABS  09/08/16  Yes [provider]  carvedilol (COREG) 6.25 MG tablet Take 1 tablet (6.25 mg total) by mouth 2 (two) times daily with a meal. 02/25/15  Yes  Bhagat, Bhavinkumar, PA  Diclofenac Sodium (PENNSAID TD) Place 400 mg onto the skin 2 (two) times daily.   Yes [provider]  DULoxetine (CYMBALTA) 30 MG capsule Take 30 mg by mouth daily. Take 30 mg capsule along with 60 mg capsule to equal 90 mg daily.   Yes [provider]  DULoxetine (CYMBALTA) 60 MG capsule Take 60 mg by mouth daily. Take 60 mg capsule along with a 30 mg capsule to equal 90 mg daily.   Yes [provider]  folic acid (FOLVITE) 1 MG tablet TAKE ONE (1) TABLET BY MOUTH EVERY DAY 01/26/17  Yes Twana First, MD  furosemide (LASIX) 20 MG tablet Take 20 mg by mouth daily as needed for fluid.   Yes [provider]  hydrochlorothiazide (MICROZIDE) 12.5 MG capsule Take 12.5 mg by mouth daily.  04/30/13   Yes [provider]  ibuprofen (ADVIL,MOTRIN) 200 MG tablet Take 400-800 mg by mouth every 6 (six) hours as needed for moderate pain.   Yes [provider]  levothyroxine (SYNTHROID, LEVOTHROID) 88 MCG tablet Take 88 mcg by mouth daily before breakfast.   Yes [provider]  Magnesium Salicylate (DOANS PILLS PO) Take 2 tablets by mouth daily as needed (pain).   Yes [provider]  Multiple Vitamin (MULTIVITAMIN WITH MINERALS) TABS tablet Take 1 tablet by mouth daily.   Yes [provider]  NON FORMULARY z-quil   Yes [provider]  olmesartan (BENICAR) 40 MG tablet Take 40 mg by mouth daily.   Yes [provider]  oxyCODONE (ROXICODONE) 15 MG immediate release tablet Take 15 mg by mouth every 4 (four) hours as needed for pain.   Yes [provider]  POLY-IRON 150 FORTE 150-25-1 MG-MCG-MG CAPS TAKE ONE (1) CAPSULE EACH DAY 03/17/17  Yes Holley Bouche, NP  potassium chloride (K-DUR) 10 MEQ tablet TAKE ONE TABLET BY MOUTH TWICE A DAY 05/04/17  Yes Holley Bouche, NP  QUEtiapine (SEROQUEL) 50 MG tablet  06/21/15  Yes [provider]  ROZEREM 8 MG tablet  09/18/16  Yes [provider]  zonisamide (ZONEGRAN) 50 MG capsule Take 3 capsules (150 mg total) by mouth 2 (two) times daily. 05/27/17  Yes Kathrynn Ducking, MD    ROS:  Out of a complete 14 system review of symptoms, the patient complains only of the following symptoms, and all other reviewed systems are negative.  Headache, tremors Back pain Anxiety Restless legs, insomnia, frequent waking  Blood pressure (!) 89/62, pulse 96, height 5\' 2"  (1.575 m), weight 156 lb (70.8 kg).  Physical Exam  General: The patient is alert and cooperative at the time of the examination.  Skin: No significant peripheral edema is noted.   Neurologic Exam  Mental status: The patient is alert and oriented x 3 at the time of the examination. The patient has  apparent normal recent and remote memory, with an apparently normal attention span and concentration ability.   Cranial nerves: Facial symmetry is present. Speech is normal, no aphasia or dysarthria is noted. Extraocular movements are full. Visual fields are full.  Motor: The patient has good strength in all 4 extremities.  Sensory examination: Soft touch sensation is symmetric on the face, arms, and legs.  Coordination: The patient has good finger-nose-finger and heel-to-shin bilaterally.  Gait and station: The patient has a normal gait. Tandem gait is normal. Romberg is negative. No drift is seen.  Reflexes: Deep tendon reflexes are symmetric.   Assessment/Plan:  1.  Migraine headache, intractable  2.  History of seizures, well controlled  Zonegran will be increased for the migraine headache taking 150 mg twice daily.  The patient will follow-up in 6 months, she is to call for any dose adjustments of the medication.  If the headaches do not abate, further therapy may be required.  A prescription was sent in for the [place].  Jill Alexanders MD 05/27/2017 12:32 PM  [facility] [address] [place], [place] [address]  Phone [phone] Fax [phone]

## 2017-05-27 NOTE — Telephone Encounter (Addendum)
Called and spoke with [place]. Verified Dr. Jannifer Franklin called in new rx for increased dose. New directions: 150mg  (3 capsules) BID. He verbalized understanding and nothing further needed.

## 2017-05-27 NOTE — Telephone Encounter (Signed)
Cathy @[facility] is asking for a call back re: the directions on  zonisamide (ZONEGRAN) 50 MG capsule for patient, please call

## 2017-05-27 NOTE — Telephone Encounter (Signed)
Tried [facility], engineering. Got busy signal both time.  Directions: 150mg  (3 capsules) BID. Dr. Jannifer Franklin sent in new rx today

## 2017-07-02 ENCOUNTER — Telehealth: Payer: Self-pay | Admitting: Neurology

## 2017-07-02 NOTE — Telephone Encounter (Signed)
Pt called and cancelled her sleep study. She stated she has other health problems going on right now that she wants to deal with first. She will call back to r/s study after other health problems are resolved.

## 2017-07-08 ENCOUNTER — Other Ambulatory Visit (HOSPITAL_COMMUNITY): Payer: Self-pay | Admitting: Adult Health

## 2017-07-08 DIAGNOSIS — E876 Hypokalemia: Secondary | ICD-10-CM

## 2017-07-10 DIAGNOSIS — E663 Overweight: Secondary | ICD-10-CM | POA: Diagnosis not present

## 2017-07-10 DIAGNOSIS — M1991 Primary osteoarthritis, unspecified site: Secondary | ICD-10-CM | POA: Diagnosis not present

## 2017-07-10 DIAGNOSIS — G894 Chronic pain syndrome: Secondary | ICD-10-CM | POA: Diagnosis not present

## 2017-07-16 ENCOUNTER — Telehealth: Payer: Self-pay | Admitting: Neurology

## 2017-07-16 NOTE — Telephone Encounter (Signed)
I called the patient.  In the beginning of February 2019 the patient was increased by 50 mg on her daily dose of her Seroquel to a total of 300 mg daily.  5 weeks later, she had some problems with insomnia for about a week and then she had some issues with visual and auditory hallucinations, she thought she was talking to her daughter who is not there.  The hallucinations have gone away, the patient wants to try to reduce as much medication as possible.  We will have her cut back to earlier original dose of Zonegran taking 100 mg in the morning and 150 mg in the evening.  The patient should not stop the Zonegran as she has a history of seizures.  By history, the patient sounds as if she may have had a mild delirium state.  The patient is also cutting back on a lot of other medications including alprazolam and Cymbalta.  She is not clear that the Zonegran helped her migraine.

## 2017-07-16 NOTE — Telephone Encounter (Signed)
Pt called about 2 weeks ago had a hallucinations that lasted for a couple of days and a feeling of having a seizure but did not go into a full blown seizure. Pt said she did not sleep the week prior. Pt feels this has something to do with the dosing increase of her seizure medication (she does not know the name). Pt said she's not had a seizure in 3 years. Please call to advise.

## 2017-07-20 ENCOUNTER — Other Ambulatory Visit (HOSPITAL_COMMUNITY): Payer: Self-pay | Admitting: Adult Health

## 2017-07-20 ENCOUNTER — Other Ambulatory Visit (HOSPITAL_COMMUNITY): Payer: Self-pay | Admitting: Oncology

## 2017-07-20 DIAGNOSIS — D649 Anemia, unspecified: Secondary | ICD-10-CM

## 2017-07-20 DIAGNOSIS — E538 Deficiency of other specified B group vitamins: Secondary | ICD-10-CM

## 2017-07-24 ENCOUNTER — Ambulatory Visit (HOSPITAL_COMMUNITY): Payer: 59 | Admitting: Internal Medicine

## 2017-07-24 ENCOUNTER — Other Ambulatory Visit (HOSPITAL_COMMUNITY): Payer: Commercial Managed Care - HMO

## 2017-08-17 DIAGNOSIS — N319 Neuromuscular dysfunction of bladder, unspecified: Secondary | ICD-10-CM | POA: Diagnosis not present

## 2017-09-02 DIAGNOSIS — Z124 Encounter for screening for malignant neoplasm of cervix: Secondary | ICD-10-CM | POA: Diagnosis not present

## 2017-09-02 DIAGNOSIS — Z01419 Encounter for gynecological examination (general) (routine) without abnormal findings: Secondary | ICD-10-CM | POA: Diagnosis not present

## 2017-09-02 LAB — HM MAMMOGRAPHY

## 2017-09-05 LAB — HM PAP SMEAR

## 2017-09-07 ENCOUNTER — Other Ambulatory Visit (HOSPITAL_COMMUNITY): Payer: Self-pay | Admitting: Adult Health

## 2017-09-07 DIAGNOSIS — E876 Hypokalemia: Secondary | ICD-10-CM

## 2017-09-08 NOTE — Telephone Encounter (Signed)
Rita Stephens,   Please ask Dr. Katragadda if he will mind refilling.   Thanks!  [name], NP [name] [phone]  

## 2017-09-09 ENCOUNTER — Other Ambulatory Visit (HOSPITAL_COMMUNITY): Payer: Self-pay | Admitting: Adult Health

## 2017-09-09 DIAGNOSIS — E876 Hypokalemia: Secondary | ICD-10-CM

## 2017-10-20 DIAGNOSIS — E063 Autoimmune thyroiditis: Secondary | ICD-10-CM | POA: Diagnosis not present

## 2017-10-20 DIAGNOSIS — J329 Chronic sinusitis, unspecified: Secondary | ICD-10-CM | POA: Diagnosis not present

## 2017-10-20 DIAGNOSIS — Z0000 Encounter for general adult medical examination without abnormal findings: Secondary | ICD-10-CM | POA: Diagnosis not present

## 2017-10-20 DIAGNOSIS — G894 Chronic pain syndrome: Secondary | ICD-10-CM | POA: Diagnosis not present

## 2017-10-21 ENCOUNTER — Ambulatory Visit: Payer: 59 | Admitting: Neurology

## 2017-11-30 ENCOUNTER — Ambulatory Visit: Payer: 59 | Admitting: Adult Health

## 2017-12-02 ENCOUNTER — Other Ambulatory Visit (HOSPITAL_COMMUNITY): Payer: Self-pay | Admitting: *Deleted

## 2017-12-02 DIAGNOSIS — D649 Anemia, unspecified: Secondary | ICD-10-CM

## 2017-12-02 MED ORDER — IRON POLYSACCH CMPLX-B12-FA 150-0.025-1 MG PO CAPS: 1.0000 | ORAL_CAPSULE | Freq: Every day | ORAL | 3 refills | Status: DC

## 2017-12-04 DIAGNOSIS — M47814 Spondylosis without myelopathy or radiculopathy, thoracic region: Secondary | ICD-10-CM | POA: Diagnosis not present

## 2017-12-04 DIAGNOSIS — G894 Chronic pain syndrome: Secondary | ICD-10-CM | POA: Diagnosis not present

## 2017-12-04 DIAGNOSIS — E063 Autoimmune thyroiditis: Secondary | ICD-10-CM | POA: Diagnosis not present

## 2017-12-17 ENCOUNTER — Ambulatory Visit: Payer: 59 | Admitting: Neurology

## 2017-12-18 ENCOUNTER — Ambulatory Visit: Payer: Self-pay | Admitting: Neurology

## 2018-02-05 DIAGNOSIS — M2550 Pain in unspecified joint: Secondary | ICD-10-CM | POA: Diagnosis not present

## 2018-02-05 DIAGNOSIS — G894 Chronic pain syndrome: Secondary | ICD-10-CM | POA: Diagnosis not present

## 2018-02-05 DIAGNOSIS — D473 Essential (hemorrhagic) thrombocythemia: Secondary | ICD-10-CM | POA: Diagnosis not present

## 2018-02-17 DIAGNOSIS — E871 Hypo-osmolality and hyponatremia: Secondary | ICD-10-CM | POA: Diagnosis not present

## 2018-02-17 DIAGNOSIS — R338 Other retention of urine: Secondary | ICD-10-CM | POA: Diagnosis not present

## 2018-02-17 LAB — LAB REPORT - SCANNED: EGFR: 56

## 2018-02-22 DIAGNOSIS — D473 Essential (hemorrhagic) thrombocythemia: Secondary | ICD-10-CM | POA: Diagnosis not present

## 2018-02-22 DIAGNOSIS — G894 Chronic pain syndrome: Secondary | ICD-10-CM | POA: Diagnosis not present

## 2018-02-22 DIAGNOSIS — M1991 Primary osteoarthritis, unspecified site: Secondary | ICD-10-CM | POA: Diagnosis not present

## 2018-02-23 DIAGNOSIS — R300 Dysuria: Secondary | ICD-10-CM | POA: Diagnosis not present

## 2018-04-26 DIAGNOSIS — I249 Acute ischemic heart disease, unspecified: Secondary | ICD-10-CM | POA: Diagnosis not present

## 2018-04-26 DIAGNOSIS — N183 Chronic kidney disease, stage 3 (moderate): Secondary | ICD-10-CM | POA: Diagnosis not present

## 2018-05-12 ENCOUNTER — Other Ambulatory Visit (HOSPITAL_COMMUNITY): Payer: Self-pay | Admitting: Medical

## 2018-05-12 ENCOUNTER — Other Ambulatory Visit: Payer: Self-pay | Admitting: Medical

## 2018-05-12 DIAGNOSIS — N183 Chronic kidney disease, stage 3 unspecified: Secondary | ICD-10-CM

## 2018-05-17 ENCOUNTER — Ambulatory Visit (HOSPITAL_COMMUNITY)
Admission: RE | Admit: 2018-05-17 | Discharge: 2018-05-17 | Disposition: A | Discharge status: Home / Self Care | Payer: 59 | Source: Ambulatory Visit | Attending: Medical | Admitting: Medical

## 2018-05-17 DIAGNOSIS — Z79899 Other long term (current) drug therapy: Secondary | ICD-10-CM | POA: Diagnosis not present

## 2018-05-17 DIAGNOSIS — R809 Proteinuria, unspecified: Secondary | ICD-10-CM | POA: Diagnosis not present

## 2018-05-17 DIAGNOSIS — N183 Chronic kidney disease, stage 3 unspecified: Secondary | ICD-10-CM

## 2018-05-17 DIAGNOSIS — I10 Essential (primary) hypertension: Secondary | ICD-10-CM | POA: Diagnosis not present

## 2018-05-19 LAB — LAB REPORT - SCANNED
Calcium: 9
Creatinine, POC: 20.7 mg/dL
EGFR: 92
HM Hepatitis Screen: NEGATIVE
PTH: 18

## 2018-06-02 DIAGNOSIS — R809 Proteinuria, unspecified: Secondary | ICD-10-CM | POA: Diagnosis not present

## 2018-06-02 DIAGNOSIS — E559 Vitamin D deficiency, unspecified: Secondary | ICD-10-CM | POA: Diagnosis not present

## 2018-06-02 DIAGNOSIS — N182 Chronic kidney disease, stage 2 (mild): Secondary | ICD-10-CM | POA: Diagnosis not present

## 2018-06-17 DIAGNOSIS — I10 Essential (primary) hypertension: Secondary | ICD-10-CM | POA: Diagnosis not present

## 2018-06-17 DIAGNOSIS — G894 Chronic pain syndrome: Secondary | ICD-10-CM | POA: Diagnosis not present

## 2018-06-17 DIAGNOSIS — G4700 Insomnia, unspecified: Secondary | ICD-10-CM | POA: Diagnosis not present

## 2018-07-01 ENCOUNTER — Other Ambulatory Visit: Payer: Self-pay

## 2018-07-01 ENCOUNTER — Encounter: Payer: Self-pay | Admitting: Neurology

## 2018-07-01 ENCOUNTER — Ambulatory Visit: Payer: 59 | Admitting: Neurology

## 2018-07-01 VITALS — BP 111/67 | HR 86 | Ht 62.0 in | Wt 148.0 lb

## 2018-07-01 DIAGNOSIS — G478 Other sleep disorders: Secondary | ICD-10-CM

## 2018-07-01 DIAGNOSIS — R351 Nocturia: Secondary | ICD-10-CM | POA: Diagnosis not present

## 2018-07-01 DIAGNOSIS — E663 Overweight: Secondary | ICD-10-CM | POA: Diagnosis not present

## 2018-07-01 DIAGNOSIS — R0683 Snoring: Secondary | ICD-10-CM | POA: Diagnosis not present

## 2018-07-01 DIAGNOSIS — R51 Headache: Secondary | ICD-10-CM

## 2018-07-01 DIAGNOSIS — G4700 Insomnia, unspecified: Secondary | ICD-10-CM

## 2018-07-01 DIAGNOSIS — G479 Sleep disorder, unspecified: Secondary | ICD-10-CM

## 2018-07-01 DIAGNOSIS — R519 Headache, unspecified: Secondary | ICD-10-CM

## 2018-07-01 NOTE — Patient Instructions (Addendum)
Based on your symptoms and your exam I believe we should look for an underlying organic sleep disorder, such as obstructive sleep apnea (OSA) with a sleep study.  If you have more than mild OSA, I want you to consider treatment with CPAP. Please remember, the risks and ramifications of moderate to severe obstructive sleep apnea or OSA are: Cardiovascular disease, including congestive heart failure, stroke, difficult to control hypertension, arrhythmias, and even type 2 diabetes has been linked to untreated OSA. Sleep apnea causes disruption of sleep and sleep deprivation in most cases, which, in turn, can cause recurrent headaches, problems with memory, mood, concentration, focus, and vigilance. Most people with untreated sleep apnea report excessive daytime sleepiness, which can affect their ability to drive. Please do not drive if you feel sleepy.   We will call you after your sleep study to advise about the results (most likely, you will hear from Rita Stephens, my nurse).    Our sleep lab administrative assistant, will call you to schedule your sleep study. If you don't hear back from her by about 2 weeks from now, please feel free to call her at [phone]. This is her direct line and please leave a message with your phone number to call back if you get the voicemail box. She will call back as soon as possible.   Your chronic insomnia may be treated with cognitive behavioral therapy (CBT-I). Please talk to Rita Stephens about a referral. You may benefit from seeing a psychologist and perhaps a psychiatrist.   We don't recommend high dose melatonin, we do not recommend more than 10 mg at night.

## 2018-07-01 NOTE — Progress Notes (Signed)
Subjective:    Patient ID: Rita Stephens is a 63 y.o. female.  HPI     Star Age, MD, PhD [facility] [address] [place], [place] [address]  Rita Stephens is a 61 year old right-handed woman with an underlying medical history of reflux disease, overweight state, migraine headaches and seizures (followed by Dr. Jannifer Franklin), smoking, hypertension, hypothyroidism and anxiety, who presents for reevaluation of her sleep disturbance, concern for underlying organic sleep disorder. I have previously met her on 09/09/2016 at the request of her primary care physician for sleep evaluation at which time she reported snoring and daytime somnolence, nonrestorative sleep and nocturia. She was advised to proceed with sleep study testing. She did not pursue sleep study testing at the time.   Today, 07/01/2018: She reports having trouble going to stay and staying asleep. She denies daytime somnolence. She typically does not nap. She tries to be in bed around 9 and is typically none sleep until 11. She is currently taking Xanax 3 mg each night, melatonin up to 50 mg each night, and Seroquel at night, 100 mg at night. She is on zonisamide for seizure disorder. She helps look after her granddaughter who is turning 1 this weekend. She goes to her home to take care of her. She is typically up at 6 for this. She has a sister with sleep apnea and a brother with sleep apnea. She has not considered cognitive behavioral therapy. She has not seen psychiatry or psychology for sleep issues. She tries to drink caffeine free soda. She quit smoking about a month ago, does not utilize alcohol. Her previous sleep study in 2013 showed an AHI of 1.5, no PLMS were noted. She would be willing to get rechecked for an underlying sleep disorder with a sleep study. She would consider CPAP therapy for sleep apnea. She reports that some months ago she was trying to come off of Xanax but had difficulty coming  off of it. She states that she was never told it was addictive. She does snore according to her husband, she has morning headaches frequently. She has nocturia about once or twice per average night. She is no longer on any narcotic pain medication.  Previously:   09/09/2016: (She) reports snoring and daytime tiredness, nonrestorative sleep, nocturia and waking up with headaches at times. She does not wake up rested. Of note, she had an overnight pulse oximetry test on 02/27/2014. I had previously reviewed the results and there were some disruptions with lost signal.  Average oxygen saturation reported to be 90.4%, nadir was 82%. Time below 89% saturation was 2 hours and 12 minutes. I reviewed your office note from 08/13/2016. She has had multiple medication trials for sleep in the past, including Ambien, trazodone, Seroquel, Rozerem and Belsomra.  She had sleep study testing in 2013 but did not get the results at the time. I reviewed the report from Dr. Merlene Laughter: sleep study on 04/09/12 showed sleep efficiency was 96%, sleep latency was 1.5 min, REM latency was 128 minutes. Baseline O2 sats 97%, nadir was 83%, AHI 1.5/hour, PLM index was 0/hour. She had 27% of slow wave sleep and REM sleep was 24%. Her Epworth sleepiness score is 9 out of 24, fatigue score is 61 out of 63. She had an increase in her Zonegran last year. She is currently on Belsomra, and 150 mg of seroquel and also Xanax 1 mg in AM and 2 mg qHS, oxycodone 15 mg bid. She takes  Zquil 2 pills at night.   Her Past Medical History Is Significant For: Past Medical History:  Diagnosis Date  . Anxiety   . Chronic pain   . Essential hypertension   . 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 History of cardiac catheterization    Normal coronaries November 2016  . History of pneumonia   . Hypothyroidism   . Migraine   . Ovarian cyst   . Spinal stenosis   . Takotsubo syndrome    November 2016  . Thrombocytosis ([place]) 01/24/2016    Her Past Surgical  History Is Significant For: Past Surgical History:  Procedure Laterality Date  . CARDIAC CATHETERIZATION N/A 02/23/2015   Procedure: Left Heart Cath and Coronary Angiography;  Surgeon: Jettie Booze, MD;  Location: [facility];  Service: Cardiovascular;  Laterality: N/A;  . CHOLECYSTECTOMY    . DOPPLER ECHOCARDIOGRAPHY    . GUM SURGERY     Removed cyst  . LEEP      Her Family History Is Significant For: Family History  Problem Relation Age of Onset  . Hypertension Father   . Breast cancer Mother        metastatic to brain  . Migraines Sister   . Migraines Brother   . Heart attack Maternal Grandfather   . Seizures Neg Hx     Her Social History Is Significant For: Social History   Socioeconomic History  . Marital status: Married    Spouse name: Not on file  . Number of children: 1  . Years of education: HS  . Highest education level: Not on file  Occupational History  . Occupation: N/A  Social Needs  . Financial resource strain: Not on file  . Food insecurity:    Worry: Not on file    Inability: Not on file  . Transportation needs:    Medical: Not on file    Non-medical: Not on file  Tobacco Use  . Smoking status: Current Every Day Smoker    Packs/day: 0.75    Years: 30.00    Pack years: 22.50    Types: Cigarettes  . Smokeless tobacco: Never Used  Substance and Sexual Activity  . Alcohol use: Yes    Alcohol/week: 0.0 standard drinks  . Drug use: No  . Sexual activity: Not on file  Lifestyle  . Physical activity:    Days per week: Not on file    Minutes per session: Not on file  . Stress: Not on file  Relationships  . Social connections:    Talks on phone: Not on file    Gets together: Not on file    Attends religious service: Not on file    Active member of club or organization: Not on file    Attends meetings of clubs or organizations: Not on file    Relationship status: Not on file  Other Topics Concern  . Not on file  Social History  Narrative   She lives with husband and daughter.  She is working as a Radiation protection practitioner.      Patient does not drink caffeine.   Patient is right handed.        Her Allergies Are:  Allergies  Allergen Reactions  . Amitriptyline Itching    Allergy identified by daughter  . Actifed Cold-Allergy [Chlorpheniramine-Phenyleph Er] Other (See Comments)    ALTERS MENTAL STATUS  . Sudafed [Pseudoephedrine Hcl] Other (See Comments)    Climbs walls  . Other Palpitations    Topamax  :  Her Current Medications Are:  Outpatient Encounter Medications as of 07/01/2018  Medication Sig  . ALPRAZolam (XANAX) 1 MG tablet Take 3 mg by mouth at bedtime.   [name] Kitchen aspirin EC 81 MG EC tablet Take 1 tablet (81 mg total) by mouth daily.  [name] Kitchen ibuprofen (ADVIL,MOTRIN) 200 MG tablet Take 400-800 mg by mouth every 6 (six) hours as needed for moderate pain.  [name] Kitchen levothyroxine (SYNTHROID, LEVOTHROID) 88 MCG tablet Take 88 mcg by mouth daily before breakfast.  . Melatonin 10 MG TABS Take 50 mg by mouth at bedtime.  [name] Kitchen olmesartan (BENICAR) 40 MG tablet Take 40 mg by mouth daily.  . QUEtiapine (SEROQUEL) 50 MG tablet Take 100 mg by mouth at bedtime.   [name] Kitchen zonisamide (ZONEGRAN) 50 MG capsule Take 3 capsules (150 mg total) by mouth 2 (two) times daily. (Patient taking differently: Take 150 mg by mouth daily. )  . [DISCONTINUED] atorvastatin (LIPITOR) 40 MG tablet Take 1 tablet (40 mg total) by mouth daily at 6 PM.  . [DISCONTINUED] BELSOMRA 20 MG TABS   . [DISCONTINUED] carvedilol (COREG) 6.25 MG tablet Take 1 tablet (6.25 mg total) by mouth 2 (two) times daily with a meal.  . [DISCONTINUED] Diclofenac Sodium (PENNSAID TD) Place 400 mg onto the skin 2 (two) times daily.  . [DISCONTINUED] DULoxetine (CYMBALTA) 30 MG capsule Take 30 mg by mouth daily. Take 30 mg capsule along with 60 mg capsule to equal 90 mg daily.  . [DISCONTINUED] DULoxetine (CYMBALTA) 60 MG capsule Take 60 mg by mouth daily. Take 60 mg capsule along with a 30 mg  capsule to equal 90 mg daily.  . [DISCONTINUED] folic acid (FOLVITE) 1 MG tablet TAKE ONE (1) TABLET BY MOUTH EVERY DAY  . [DISCONTINUED] furosemide (LASIX) 20 MG tablet Take 20 mg by mouth daily as needed for fluid.  . [DISCONTINUED] hydrochlorothiazide (MICROZIDE) 12.5 MG capsule Take 12.5 mg by mouth daily.   . [DISCONTINUED] Iron Polysacch Cmplx-B12-FA (POLY-IRON 150 FORTE) 150-0.025-1 MG CAPS Take 1 capsule by mouth daily.  . [DISCONTINUED] Magnesium Salicylate (DOANS PILLS PO) Take 2 tablets by mouth daily as needed (pain).  . [DISCONTINUED] Multiple Vitamin (MULTIVITAMIN WITH MINERALS) TABS tablet Take 1 tablet by mouth daily.  . [DISCONTINUED] NON FORMULARY z-quil  . [DISCONTINUED] oxyCODONE (ROXICODONE) 15 MG immediate release tablet Take 15 mg by mouth every 4 (four) hours as needed for pain.  . [DISCONTINUED] potassium chloride (K-DUR) 10 MEQ tablet TAKE ONE TABLET BY MOUTH TWICE A DAY  . [DISCONTINUED] ROZEREM 8 MG tablet    No facility-administered encounter medications on file as of 07/01/2018.   :  Review of Systems:  Out of a complete 14 point review of systems, all are reviewed and negative with the exception of these symptoms as listed below: Review of Systems  Neurological:       Pt presents today to discuss her sleep. Pt reports that she cannot sleep without taking xanax 94m, melatonin 532m and seroquel 1002mPt reports that she does snore.  Epworth Sleepiness Scale 0= would never doze 1= slight chance of dozing 2= moderate chance of dozing 3= high chance of dozing  Sitting and reading: 0 Watching TV: 0 Sitting inactive in a public place (ex. Theater or meeting): 0 As a passenger in a car for an hour without a break: 0 Lying down to rest in the afternoon: 0 Sitting and talking to someone: 0 Sitting quietly after lunch (no alcohol): 0 In a car, while stopped in traffic: 0 Total:  0     Objective:  Neurological Exam  Physical Exam Physical Examination:    Vitals:   07/01/18 1451  BP: 111/67  Pulse: 86    General Examination: The patient is a very pleasant 63 y.o. female in no acute distress. She appears well-developed and well-nourished and well groomed.   HEENT: Normocephalic, atraumatic, pupils are equal, round and reactive to light and accommodation. Extraocular tracking is good without limitation to gaze excursion or nystagmus noted. Normal smooth pursuit is noted. Hearing is grossly intact. Face is symmetric with normal facial animation and normal facial sensation. Speech is clear with no dysarthria noted. There is no hypophonia. There is no lip, neck/head, jaw or voice tremor. Neck is supple with full range of passive and active motion. There are no carotid bruits on auscultation. Oropharynx exam reveals: mild mouth dryness, adequate dental hygiene and moderate airway crowding, due to redundant soft palate, thicker tongue, tonsils are not fully visualized, smaller airway entry. Mallampati is class III. Tongue protrudes centrally and palate elevates symmetrically. Neck size is 17 inches. She has a Moderate overbite.   Chest: Clear to auscultation without wheezing, rhonchi or crackles noted.  Heart: S1+S2+0, regular and normal without murmurs, rubs or gallops noted.   Abdomen: Soft, non-tender and non-distended with normal bowel sounds appreciated on auscultation.  Extremities: There is no pitting edema in the distal lower extremities bilaterally.  Skin: Warm and dry without trophic changes noted.  Musculoskeletal: exam reveals no obvious joint deformities, tenderness or joint swelling or erythema, except for b/l knee pain. Back pain, scoliosis.  Neurologically:  Mental status: The patient is awake, alert and oriented in all 4 spheres. Her immediate and remote memory, attention, language skills and fund of knowledge are appropriate. There is no evidence of aphasia, agnosia, apraxia or anomia. Speech is clear with normal prosody  and enunciation. Thought process is linear. Mood is normal and affect is normal.  Cranial nerves II - XII are as described above under HEENT exam. In addition: shoulder shrug is normal with equal shoulder height noted. Motor exam: Normal bulk, strength and tone is noted. There is no drift, tremor or rebound. Romberg is negative. Reflexes are 2+ throughout. Fine motor skills and coordination: intact with normal finger taps, normal hand movements, normal rapid alternating patting, normal foot taps and normal foot agility.  Cerebellar testing: No dysmetria or intention tremor on finger to nose testing. Heel to shin is unremarkable bilaterally. There is no truncal or gait ataxia.  Sensory exam: intact to light touch in the upper and lower extremities.  Gait, station and balance: She stands easily. No veering to one side is noted. No leaning to one side is noted. Posture is age-appropriate and stance is narrow based. Gait shows normal stride length and normal pace. No problems turning are noted. Tandem walk is not possible for her due to knee pain.   Assessment and Plan:  In summary, Rita Stephens is a very pleasant 63 year old female with an underlying medical history of reflux disease, overweight state, migraine headaches and seizures (followed by Dr. Jannifer Franklin), recent smoking cessation, hypertension, hypothyroidism and anxiety, who presents for reevaluation of her sleep disturbance. She has a family history of OSA. She has difficulty initiating and maintaining sleep for years, she snores, endorses nocturia and morning headaches. For her insomnia she is advised that she could seek consultation with a sleep psychologist. She is strongly encouraged to talk to her primary care physician about a referral. She is somewhat  reluctant about it. 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 She is  furthermore advised that we do not recommend melatonin high-dose, more than 10 mg at night is not recommended. She is taking up to 50 mg. I explained the sleep test procedure to her. She would be willing to consider CPAP therapy if the need arises. She  Again, for her chronic sleep difficulties, she is encouraged to talk to Dr. Gerarda Fraction about pursuing cognitive behavioral therapy through psychology, which I had recommended in 2018. We will proceed with sleep study testing and consider CPAP therapy or AutoPap therapy if she qualifies for it. I plan to see her back after her sleep studies completed. I answered all her questions today and she was in agreement.I spent 25 minutes in total face-to-face time with the patient, more than 50% of which was spent in counseling and coordination of care, reviewing test results, reviewing medication and discussing or reviewing the diagnosis of sleep disorder, its prognosis and treatment options. Pertinent laboratory and imaging test results that were available during this visit with the patient were reviewed by me and considered in my medical decision making (see chart for details).

## 2018-07-09 ENCOUNTER — Telehealth: Payer: Self-pay | Admitting: Neurology

## 2018-07-09 ENCOUNTER — Other Ambulatory Visit: Payer: Self-pay | Admitting: Neurology

## 2018-07-09 NOTE — Telephone Encounter (Signed)
I called the patient.  The patient had been on Zonegran 150 mg twice daily, she indicated that she could not tolerate this and was only taking 150 mg once at night.  I will send in a prescription for to take 100 mg twice daily, if she cannot tolerate this dose or her headaches are more frequent, she is to contact our office.

## 2018-07-09 NOTE — Telephone Encounter (Signed)
I called pt. Received a refill request for zonisamide 150mg  BID but pt's chart reflects that she is only taking zonisamide 150mg  daily. She reports that she cut back her zonisamide in October because she was vomiting for 3 weeks and she cut back all of her medications. Pt denies seizure activity. Pt says she is having migraines because she is not sleeping. Pt saw Dr. Rexene Alberts last week. Pt will be out of zonisamide next week. I advised pt that I will inform Dr. Jannifer Franklin of this change in her RX and ask him to refill if appropriate. Pt verbalized understanding.

## 2018-07-20 DIAGNOSIS — N186 End stage renal disease: Secondary | ICD-10-CM | POA: Diagnosis not present

## 2018-07-20 DIAGNOSIS — Z992 Dependence on renal dialysis: Secondary | ICD-10-CM | POA: Diagnosis not present

## 2018-07-26 ENCOUNTER — Telehealth: Payer: Self-pay | Admitting: Neurology

## 2018-07-26 NOTE — Telephone Encounter (Signed)
Would you please call pt and offer a video or telephone visit with Dr. Jannifer Franklin tomorrow (07/27/18). Thanks!

## 2018-07-26 NOTE — Telephone Encounter (Signed)
Pt called in and gave consent for a telephone visit

## 2018-07-26 NOTE — Telephone Encounter (Signed)
I called and LVM advising patient that Dr. Jannifer Franklin would like to see her for an OV since it has been over a year. I explained that we could do a virtual visit and to call us back so we can explain in more detail. If patient is willing to do a VV, please get an email address. If not, offer a telephone visit.

## 2018-07-26 NOTE — Telephone Encounter (Signed)
I contacted the pt. Pt agreed to completing a telephone visit with Dr. Jannifer Franklin on 07/27/18 at 3:30 pm.   Pt understands that although there may be some limitations with this type of visit, we will take all precautions to reduce any security or privacy concerns.  Pt understands that this will be treated like an in office visit and we will file with pt's insurance, and there may be a patient responsible charge related to this service.  I reviewed and updated pt's medications, allergies and pharmacy.   Pt confirmed best call back # is [phone].

## 2018-07-26 NOTE — Telephone Encounter (Signed)
Pt called stating that she is not able to sleep on her zonisamide [facility]) She states she is still having the migraines. Please advise.

## 2018-07-26 NOTE — Addendum Note (Signed)
Addended by: Verlin Grills T on: 07/26/2018 01:42 PM   Modules accepted: Orders

## 2018-07-27 ENCOUNTER — Encounter: Payer: Self-pay | Admitting: Neurology

## 2018-07-27 ENCOUNTER — Other Ambulatory Visit: Payer: Self-pay

## 2018-07-27 ENCOUNTER — Ambulatory Visit (INDEPENDENT_AMBULATORY_CARE_PROVIDER_SITE_OTHER): Payer: 59 | Admitting: Neurology

## 2018-07-27 DIAGNOSIS — F5104 Psychophysiologic insomnia: Secondary | ICD-10-CM | POA: Diagnosis not present

## 2018-07-27 DIAGNOSIS — R569 Unspecified convulsions: Secondary | ICD-10-CM | POA: Diagnosis not present

## 2018-07-27 DIAGNOSIS — G43019 Migraine without aura, intractable, without status migrainosus: Secondary | ICD-10-CM | POA: Insufficient documentation

## 2018-07-27 HISTORY — DX: Psychophysiologic insomnia: F51.04

## 2018-07-27 HISTORY — DX: Migraine without aura, intractable, without status migrainosus: G43.019

## 2018-07-27 MED ORDER — QUETIAPINE FUMARATE 100 MG PO TABS: ORAL_TABLET | ORAL | 30 refills | Status: DC

## 2018-07-27 NOTE — Progress Notes (Signed)
     Virtual Visit via Telephone Note  I connected with [name] on 07/27/18 at  3:30 PM EDT by telephone and verified that I am speaking with the correct person using two identifiers.   I discussed the limitations, risks, security and privacy concerns of performing an evaluation and management service by telephone and the availability of in person appointments. I also discussed with the patient that there may be a patient responsible charge related to this service. The patient expressed understanding and agreed to proceed.   History of Present Illness: Rita Stephens is a 63 year old right-handed white female with a history of chronic insomnia and a history of intractable headaches.  The patient also has had seizures in the past.  She is on Zonegran taking 100 mg twice daily, she has tolerated this drug and has not had any recurring seizures.  She mainly complains of chronic ongoing insomnia, she has been on Seroquel 50 mg at night, she may occasionally take up to 150 mg without benefit.  She is on Xanax 1 mg tablets and will sometimes take 3 of these at night without benefit.  She claims that she only gets 1 hour of sleep at night and that she may go 3 days in a row without any sleep.  She is having headaches at least 5 days a week.  In the past, she has been on Topamax, Cymbalta, Seroquel, Depakote, trazodone, and amitriptyline without benefit or without tolerance, with ongoing headaches.  She has been tried on Belsomra for sleep without benefit.  Ambien in the past has not helped.   Observations/Objective: On the telephone interview, the patient is bright and alert, cooperative.  The patient is answering questions appropriately.  She has normal speech pattern, no aphasia or dysarthria is noted.  Assessment and Plan: 1.  Chronic insomnia  2.  Intractable migraine  3.  History of seizures  The patient is doing well with her seizures but she is doing poorly with her headaches and insomnia  issues.  The patient will be placed back on Seroquel but in much higher doses, she will go to 100 mg at night for 2 weeks and go to 200 mg at night.  She will call in several weeks if she is not doing well.  The Seroquel may help her rest as well as help her headache.  She will follow-up here in about 4 months.  In the future, she may potentially be a candidate for Aimovig or Ajovy or even Botox therapy for her headache.  Follow Up Instructions: 30-month follow-up, may see nurse practitioner.   I discussed the assessment and treatment plan with the patient. The patient was provided an opportunity to ask questions and all were answered. The patient agreed with the plan and demonstrated an understanding of the instructions.   The patient was advised to call back or seek an in-person evaluation if the symptoms worsen or if the condition fails to improve as anticipated.  I provided 20 minutes of non-face-to-face time during this encounter.   Kathrynn Ducking, MD

## 2018-08-04 ENCOUNTER — Telehealth: Payer: Self-pay | Admitting: Neurology

## 2018-08-04 NOTE — Telephone Encounter (Signed)
I called the patient.  The patient was given a prescription for Seroquel to take 100 mg at night for 2 weeks then go to 200 mg at night.  She calls after 1 week still on 100 mg but she is tolerating the drug well, she may go up now up to 200 mg at night.

## 2018-08-04 NOTE — Telephone Encounter (Signed)
Pt is asking if Dr Jannifer Franklin will go ahead and make the increase on her QUEtiapine (SEROQUEL) 100 MG tablet, current dose is not helping

## 2018-08-12 ENCOUNTER — Telehealth: Payer: Self-pay | Admitting: Neurology

## 2018-08-12 NOTE — Telephone Encounter (Signed)
Pt called me asking about her sleep study. I informed the pt that unfortunately, our sleep lab is closed due to COVID-19. The pt said, she understands that things are going on but she don't understand why we are unable to do her sleep study. I apologized to the pt for this inconvenience however, this is coming down from [facility] that we are to stop all procedures. The pt said, that she needs to have the sleep study done because it leads to her having seizures. Once again I apologized to the pt. She said, that her primary care referred her here because, he thought we could get this done for her. I informed her that from my understanding all or most sleep labs are closed. The pt then asked if she called and spoke to Dr. Jannifer Franklin could he not make someone open the sleep lab since he sees her for seizures. I informed the pt closing the sleep lab was not our physicians decision the order came down from [facility]. I told the pt I would be glad to talk to Dr. Rexene Alberts and my manager to see if we could possibly do a home sleep study. The pt stated, she has done those before and they are a joke. I informed the pt we could get her in as soon as possible once the lab reopens. The pt then hung up on me. I went to my manager Robin and she informed me that the pt can not have a home sleep study because we are checking to see if the pt is having seizures during sleep.

## 2018-08-16 NOTE — Telephone Encounter (Signed)
Unfortunately, nothing else I can do. Will keep her in mind when it is deemed safe to bring in patients for sleep testing in-lab and schedule her as soon as possible. Will copy Dr. Jannifer Franklin to keep him in the loop.

## 2018-08-23 MED ORDER — QUETIAPINE FUMARATE 100 MG PO TABS: 300.0000 mg | ORAL_TABLET | Freq: Every day | ORAL | 3 refills | Status: DC

## 2018-08-23 MED ORDER — QUETIAPINE FUMARATE 100 MG PO TABS: ORAL_TABLET | ORAL | 3 refills | Status: DC

## 2018-08-23 NOTE — Telephone Encounter (Signed)
I called the patient.  The patient is on 200 mg of Seroquel at night and still not sleeping, she is still having headaches, we will go up on the dose to 300 mg at night of the Seroquel, a prescription was sent in.  The Seroquel hopefully will help both sleep issues and the headache.

## 2018-08-23 NOTE — Addendum Note (Signed)
Addended by: Kathrynn Ducking on: 08/23/2018 04:42 PM   Modules accepted: Orders

## 2018-08-23 NOTE — Telephone Encounter (Signed)
I contacted the pt and advised I have refilled the Seroquel 100 mg to [facility]. Pt wanted to advise Dr. Jannifer Franklin she has not been sleeping well and is discourage she is having to wait for office to complete her sleep study. Pt states since she is not sleeping well and her migraine have increased and she is asking for MD to review and possibly make medication adjustments.

## 2018-08-23 NOTE — Telephone Encounter (Signed)
Pt requesting a call stating she will run out of  QUEtiapine (SEROQUEL) 100 MG tablet tonight and would like to discuss refills with RN- also wanting to touch base on her migraines. Please call to advise

## 2018-08-23 NOTE — Addendum Note (Signed)
Addended by: Verlin Grills T on: 08/23/2018 03:46 PM   Modules accepted: Orders

## 2018-09-07 ENCOUNTER — Telehealth: Payer: Self-pay | Admitting: Neurology

## 2018-09-07 MED ORDER — ERENUMAB-AOOE 140 MG/ML ~~LOC~~ SOAJ: 140.0000 mg | SUBCUTANEOUS | 4 refills | Status: DC

## 2018-09-07 NOTE — Telephone Encounter (Signed)
Patient called and requested to speak with Dr. Jannifer Franklin regarding a medication change for her sleeping issues. She also feels she may need to get back on some migraine medication. Please call and advise.

## 2018-09-07 NOTE — Telephone Encounter (Signed)
I called the patient.  The patient is on 300 mg of Seroquel and 3 mg of Xanax at night and still not sleeping, I am not sure I can help her with this issue.  She is still having a lot of headaches, we will try Aimovig for her frequent headaches, she is having at least 5 headaches a week.

## 2018-09-08 ENCOUNTER — Telehealth: Payer: Self-pay

## 2018-09-08 NOTE — Telephone Encounter (Signed)
PA for Aimovig submitted to [facility] via cover my meds. Pt has tried and failed Topamax, Cymbalta, Seroquel, Depakote, trazodone, and amitriptyline.  PA received instant approval. Key: [id number] PA effective 09/08/18-12/09/18 [place] notified.

## 2018-09-10 DIAGNOSIS — E663 Overweight: Secondary | ICD-10-CM | POA: Diagnosis not present

## 2018-09-10 DIAGNOSIS — D473 Essential (hemorrhagic) thrombocythemia: Secondary | ICD-10-CM | POA: Diagnosis not present

## 2018-09-28 ENCOUNTER — Telehealth: Payer: Self-pay

## 2018-09-28 NOTE — Telephone Encounter (Signed)
Received auth from insurance to schedule in lab sleep study. Called pt this afternoon to schedule and she stated she has changed her mind. Her PCP has her on new medication and she states that is working for her right now. Pt was told to call back if she changes her mind.

## 2018-11-03 LAB — LAB REPORT - SCANNED: EGFR: 62

## 2018-11-04 ENCOUNTER — Other Ambulatory Visit (HOSPITAL_COMMUNITY): Payer: Self-pay | Admitting: Internal Medicine

## 2018-11-04 ENCOUNTER — Other Ambulatory Visit: Payer: Self-pay | Admitting: Internal Medicine

## 2018-11-04 DIAGNOSIS — R1310 Dysphagia, unspecified: Secondary | ICD-10-CM

## 2018-11-10 ENCOUNTER — Other Ambulatory Visit (HOSPITAL_COMMUNITY): Payer: Self-pay | Admitting: Internal Medicine

## 2018-11-10 ENCOUNTER — Ambulatory Visit (HOSPITAL_COMMUNITY): Payer: 59

## 2018-11-10 ENCOUNTER — Ambulatory Visit (HOSPITAL_COMMUNITY)
Admission: RE | Admit: 2018-11-10 | Discharge: 2018-11-10 | Disposition: A | Discharge status: Home / Self Care | Payer: 59 | Source: Ambulatory Visit | Attending: Internal Medicine | Admitting: Internal Medicine

## 2018-11-10 ENCOUNTER — Other Ambulatory Visit: Payer: Self-pay

## 2018-11-10 DIAGNOSIS — R1310 Dysphagia, unspecified: Secondary | ICD-10-CM | POA: Insufficient documentation

## 2018-12-03 ENCOUNTER — Ambulatory Visit: Payer: 59 | Admitting: Neurology

## 2018-12-05 NOTE — Progress Notes (Signed)
PATIENT: Rita Stephens DOB: [birth year]  REASON FOR VISIT: follow up HISTORY FROM: patient  HISTORY OF PRESENT ILLNESS: Today 12/06/18  Ms. Schoenberger is a 63 year old female with history of chronic insomnia and history of intractable headaches.  She has also had seizures in the past. She was recently started on Aimovig 140 mg monthly injection for her headaches. She has been doing well. She has had 3 Aimovig injections at this point. She is having less than 10 headaches a month. It really depends on the weather.  When she does have a headache she will take OTC medication with near complete resolution.  With Aimovig, she has had about 75% improvement in her headaches. She is also sleeping better. She is taking 2 mg Xanax at bedtime, Seroquel 300 mg at bedtime. She is sleeping well. She denies any recent seizures. She was scheduled for sleep study, but it was cancelled due to the pandemic.  She has not been able to call back to reschedule.  She does report a new diagnosis of hiatal hernia and low sodium level.  She does take care of her grandchild during the day.  She presents today for follow-up unaccompanied.  For headaches, in the past she is tried Topamax, Cymbalta, Seroquel, Depakote, trazodone, and amitriptyline.  She is also tried Belsomra and Ambien in the past for sleep without benefit.  HISTORY 07/27/2018 Dr. Jannifer Franklin: Rita Stephens is a 23 year old right-handed white female with a history of chronic insomnia and a history of intractable headaches.  The patient also has had seizures in the past.  She is on Zonegran taking 100 mg twice daily, she has tolerated this drug and has not had any recurring seizures.  She mainly complains of chronic ongoing insomnia, she has been on Seroquel 50 mg at night, she may occasionally take up to 150 mg without benefit.  She is on Xanax 1 mg tablets and will sometimes take 3 of these at night without benefit.  She claims that she only gets 1 hour of sleep at night and  that she may go 3 days in a row without any sleep.  She is having headaches at least 5 days a week.  In the past, she has been on Topamax, Cymbalta, Seroquel, Depakote, trazodone, and amitriptyline without benefit or without tolerance, with ongoing headaches.  She has been tried on Belsomra for sleep without benefit.  Ambien in the past has not helped.   REVIEW OF SYSTEMS: Out of a complete 14 system review of symptoms, the patient complains only of the following symptoms, and all other reviewed systems are negative.  Seizure, headache  ALLERGIES: Allergies  Allergen Reactions  . Amitriptyline Itching    Allergy identified by daughter  . Actifed Cold-Allergy [Chlorpheniramine-Phenyleph Er] Other (See Comments)    ALTERS MENTAL STATUS  . Sudafed [Pseudoephedrine Hcl] Other (See Comments)    Climbs walls  . Other Palpitations    Topamax    HOME MEDICATIONS: Outpatient Medications Prior to Visit  Medication Sig Dispense Refill  . ALPRAZolam (XANAX) 1 MG tablet Take 3 mg by mouth at bedtime.     [name] Kitchen aspirin EC 81 MG EC tablet Take 1 tablet (81 mg total) by mouth daily.    [name] Kitchen ibuprofen (ADVIL,MOTRIN) 200 MG tablet Take 400-800 mg by mouth every 6 (six) hours as needed for moderate pain.    [name] Kitchen levocetirizine (XYZAL) 5 MG tablet     . levothyroxine (SYNTHROID, LEVOTHROID) 88 MCG tablet Take 88 mcg by mouth  daily before breakfast.    . Melatonin 10 MG TABS Take 50 mg by mouth at bedtime.    [name] Kitchen nystatin cream (MYCOSTATIN)     . Erenumab-aooe (AIMOVIG) 140 MG/ML SOAJ Inject 140 mg into the skin every 30 (thirty) days. 1 pen 4  . QUEtiapine (SEROQUEL) 100 MG tablet Take 3 tablets (300 mg total) by mouth at bedtime. 90 tablet 3  . zonisamide (ZONEGRAN) 50 MG capsule Take 2 capsules (100 mg total) by mouth 2 (two) times daily. 360 capsule 3  . olmesartan (BENICAR) 40 MG tablet Take 40 mg by mouth daily.     No facility-administered medications prior to visit.     PAST MEDICAL HISTORY: Past  Medical History:  Diagnosis Date  . Anxiety   . Chronic insomnia 07/27/2018  . Chronic pain   . Common migraine with intractable migraine 07/27/2018  . Essential hypertension   . 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 History of cardiac catheterization    Normal coronaries November 2016  . History of pneumonia   . Hypothyroidism   . Migraine   . Ovarian cyst   . Spinal stenosis   . Takotsubo syndrome    November 2016  . Thrombocytosis ([place]) 01/24/2016    PAST SURGICAL HISTORY: Past Surgical History:  Procedure Laterality Date  . CARDIAC CATHETERIZATION N/A 02/23/2015   Procedure: Left Heart Cath and Coronary Angiography;  Surgeon: Jettie Booze, MD;  Location: [facility];  Service: Cardiovascular;  Laterality: N/A;  . CHOLECYSTECTOMY    . DOPPLER ECHOCARDIOGRAPHY    . GUM SURGERY     Removed cyst  . LEEP      FAMILY HISTORY: Family History  Problem Relation Age of Onset  . Hypertension Father   . Breast cancer Mother        metastatic to brain  . Migraines Sister   . Migraines Brother   . Heart attack Maternal Grandfather   . Seizures Neg Hx     SOCIAL HISTORY: Social History   Socioeconomic History  . Marital status: Married    Spouse name: Not on file  . Number of children: 1  . Years of education: HS  . Highest education level: Not on file  Occupational History  . Occupation: N/A  Social Needs  . Financial resource strain: Not on file  . Food insecurity    Worry: Not on file    Inability: Not on file  . Transportation needs    Medical: Not on file    Non-medical: Not on file  Tobacco Use  . Smoking status: Current Every Day Smoker    Packs/day: 0.75    Years: 30.00    Pack years: 22.50    Types: Cigarettes  . Smokeless tobacco: Never Used  Substance and Sexual Activity  . Alcohol use: Yes    Alcohol/week: 0.0 standard drinks  . Drug use: No  . Sexual activity: Not on file  Lifestyle  . Physical activity    Days per week: Not on file     Minutes per session: Not on file  . Stress: Not on file  Relationships  . Social Herbalist on phone: Not on file    Gets together: Not on file    Attends religious service: Not on file    Active member of club or organization: Not on file    Attends meetings of clubs or organizations: Not on file    Relationship status: Not on file  .  Intimate partner violence    Fear of current or ex partner: Not on file    Emotionally abused: Not on file    Physically abused: Not on file    Forced sexual activity: Not on file  Other Topics Concern  . Not on file  Social History Narrative   She lives with husband and daughter.  She is working as a Radiation protection practitioner.      Patient does not drink caffeine.   Patient is right handed.          PHYSICAL EXAM  Vitals:   12/06/18 1328  BP: (!) 146/88  Pulse: 86  Temp: (!) 97.1 F (36.2 C)  Weight: 172 lb (78 kg)  Height: 5\' 2"  (1.575 m)   Body mass index is 31.46 kg/m.  Generalized: Well developed, in no acute distress   Neurological examination  Mentation: Alert oriented to time, place, history taking. Follows all commands speech and language fluent Cranial nerve II-XII: Pupils were equal round reactive to light. Extraocular movements were full, visual field were full on confrontational test. Facial sensation and strength were normal. Head turning and shoulder shrug  were normal and symmetric. Motor: The motor testing reveals 5 over 5 strength of all 4 extremities. Good symmetric motor tone is noted throughout.  Sensory: Sensory testing is intact to soft touch on all 4 extremities. No evidence of extinction is noted.  Coordination: Cerebellar testing reveals good finger-nose-finger and heel-to-shin bilaterally.  Gait and station: Gait is normal. Tandem gait is mildly unsteady.  Romberg is negative. No drift is seen.  Reflexes: Deep tendon reflexes are symmetric and normal bilaterally.   DIAGNOSTIC DATA (LABS, IMAGING, TESTING) - I  reviewed patient records, labs, notes, testing and imaging myself where available.  Lab Results  Component Value Date   WBC 11.7 (H) 03/26/2017   HGB 12.4 03/26/2017   HCT 37.8 03/26/2017   MCV 95.9 03/26/2017   PLT 360 03/26/2017      Component Value Date/Time   NA 126 (L) 03/26/2017 1402   K 4.0 03/26/2017 1402   CL 93 (L) 03/26/2017 1402   CO2 24 03/26/2017 1402   GLUCOSE 111 (H) 03/26/2017 1402   BUN 14 03/26/2017 1402   CREATININE 0.74 03/26/2017 1402   CALCIUM 9.0 03/26/2017 1402   PROT 6.8 02/26/2016 1147   ALBUMIN 3.8 02/26/2016 1147   AST 63 (H) 02/26/2016 1147   ALT 95 (H) 02/26/2016 1147   ALKPHOS 225 (H) 02/26/2016 1147   BILITOT 0.8 02/26/2016 1147   GFRNONAA >60 03/26/2017 1402   GFRAA >60 03/26/2017 1402   Lab Results  Component Value Date   CHOL 208 (H) 02/23/2015   HDL 43 02/23/2015   LDLCALC 146 (H) 02/23/2015   TRIG 95 02/23/2015   CHOLHDL 4.8 02/23/2015   Lab Results  Component Value Date   HGBA1C 5.7 05/04/2013   Lab Results  Component Value Date   VITAMINB12 913 03/26/2017   Lab Results  Component Value Date   TSH 0.639 02/24/2015    ASSESSMENT AND PLAN 63 y.o. year old female  has a past medical history of Anxiety, Chronic insomnia (07/27/2018), Chronic pain, Common migraine with intractable migraine (07/27/2018), Essential hypertension, Folate deficiency (05/22/2016), History of cardiac catheterization, History of pneumonia, Hypothyroidism, Migraine, Ovarian cyst, Spinal stenosis, Takotsubo syndrome, and Thrombocytosis ([place]) (01/24/2016). here with:  1.  Seizures 2.  Migraine headaches 3.  Chronic insomnia  Since last visit, she is doing much better.  She has seen good benefit  with Aimovig 140 mg monthly injection.  She has had more than 75% improvement in her headaches.  She says she is having less than 10 migraines a month.  She will continue taking Aimovig.  She is also sleeping better.  For now, she can continue taking Seroquel 300 mg at  bedtime.  She did have a sleep evaluation with Dr. Rexene Alberts, but was never able to have her sleep study.  I provided her with a number to call back to get this scheduled.  She has not had recurrent seizure.  She will continue taking Zonegran 100 mg twice a day.  She will follow-up in 6 months or sooner if needed.  I advised that if her symptoms worsen or she develops any new symptoms she should let us know.   I spent 25 minutes with the patient. 50% of this time was spent discussing her plan of care.   Butler Denmark, AGNP-C, DNP 12/06/2018, 2:03 PM [facility] [address] [place], [place] [address] [phone]

## 2018-12-06 ENCOUNTER — Ambulatory Visit: Payer: 59 | Admitting: Neurology

## 2018-12-06 ENCOUNTER — Encounter: Payer: Self-pay | Admitting: Neurology

## 2018-12-06 ENCOUNTER — Other Ambulatory Visit: Payer: Self-pay

## 2018-12-06 VITALS — BP 146/88 | HR 86 | Temp 97.1°F | Ht 62.0 in | Wt 172.0 lb

## 2018-12-06 DIAGNOSIS — G43019 Migraine without aura, intractable, without status migrainosus: Secondary | ICD-10-CM

## 2018-12-06 DIAGNOSIS — R569 Unspecified convulsions: Secondary | ICD-10-CM

## 2018-12-06 DIAGNOSIS — F5104 Psychophysiologic insomnia: Secondary | ICD-10-CM

## 2018-12-06 MED ORDER — ZONISAMIDE 50 MG PO CAPS: 100.0000 mg | ORAL_CAPSULE | Freq: Two times a day (BID) | ORAL | 3 refills | Status: DC

## 2018-12-06 MED ORDER — AIMOVIG 140 MG/ML ~~LOC~~ SOAJ: 140.0000 mg | SUBCUTANEOUS | 5 refills | Status: DC

## 2018-12-06 MED ORDER — QUETIAPINE FUMARATE 100 MG PO TABS: 300.0000 mg | ORAL_TABLET | Freq: Every day | ORAL | 3 refills | Status: DC

## 2018-12-06 NOTE — Patient Instructions (Signed)
Please call to schedule sleep study.

## 2018-12-08 ENCOUNTER — Encounter: Payer: Self-pay | Admitting: Gastroenterology

## 2018-12-09 ENCOUNTER — Telehealth: Payer: Self-pay | Admitting: *Deleted

## 2018-12-09 NOTE — Telephone Encounter (Signed)
PA approved 12/09/2018 - 12/09/2019. PA# [employer] [id number]

## 2018-12-09 NOTE — Progress Notes (Signed)
I have read the note, and I agree with the clinical assessment and plan.  Charles K Willis   

## 2018-12-09 NOTE — Telephone Encounter (Signed)
Submitted PA Aimovig on CMM. Key: [id number]. Waiting on determination.

## 2019-01-17 LAB — LAB REPORT - SCANNED
EGFR: 35
TSH: 4.98 (ref 0.41–5.90)

## 2019-01-19 ENCOUNTER — Other Ambulatory Visit: Payer: Self-pay

## 2019-01-19 ENCOUNTER — Encounter: Payer: Self-pay | Admitting: Gastroenterology

## 2019-01-19 ENCOUNTER — Ambulatory Visit: Payer: 59 | Admitting: Gastroenterology

## 2019-01-19 DIAGNOSIS — R1310 Dysphagia, unspecified: Secondary | ICD-10-CM

## 2019-01-19 DIAGNOSIS — R1013 Epigastric pain: Secondary | ICD-10-CM | POA: Diagnosis not present

## 2019-01-19 DIAGNOSIS — R1319 Other dysphagia: Secondary | ICD-10-CM | POA: Insufficient documentation

## 2019-01-19 MED ORDER — PANTOPRAZOLE SODIUM 40 MG PO TBEC: DELAYED_RELEASE_TABLET | ORAL | 11 refills | Status: DC

## 2019-01-19 NOTE — Patient Instructions (Signed)
TO CONTROL HEARTBURN:   1. DRINK WATER TO KEEP YOUR URINE LIGHT YELLOW.    2. CONTINUE YOUR WEIGHT LOSS EFFORTS. EA 4-6 SMALL MEALS/SNACKS DAILY NOT JUST ONE MEAL.    3. Avoid reflux triggers. SEE INFO BELOW.    5. STRICTLY FOLLOW A LOW FAT DIET. AVOID FRIED FOODS. AVOID FAST FOOD.    6. START PROTONIX. TAKE 30 MINUTES PRIOR TO YOUR FIRST MEAL EVERY DAY.    7. USE PEPCID OR TAGAMET FOR BREAKTHROUGH HEARTBURN/REFLUX.    8. CUT SMOKING IN HALF.   To have more energy, and to lose weight:  EAT TO LIVE AND THINK OF FOOD AS MEDICINE. 75% OF YOUR PLATE SHOULD BE FRUITS/VEGGIES.     1. I RECOMMEND YOU READ AND FOLLOW RECOMMENDATIONS BY DR. MARK HYMAN, "10-DAY DETOX DIET".    2. If you must eat bread, EAT EZEKIEL BREAD. IT IS IN THE FROZEN SECTION OF THE GROCERY STORE.    3. DRINK WATER WITH FRUIT OR CUCUMBER ADDED. YOUR URINE LIGHT SHOULD BE LIGHT YELLOW. AVOID SODA, GATORADE, ENERGY DRINKS, OR DIET SODA.     4. AVOID HIGH FRUCTOSE CORN SYRUP.     5. DO NOT chew SUGAR FREE GUM OR USE ARTIFICIAL SWEETENERS. IF NEEDED USE STEVIA AS A SWEETENER.    6. DO NOT EAT ENRICHED WHEAT FLOUR, PASTA, RICE, OR CEREAL.    7. ONLY EAT WILD CAUGHT SEAFOOD, GRASS FED BEEF OR CHICKEN, PORK FROM PASTURE RAISE PIGS, OR EGGS FROM PASTURE RAISED CHICKENS.    8. PRACTICE CHAIR YOGA FOR 15-30 MINS 3 OR 4 TIMES A WEEK AND PROGRESS TO HATHA YOGA OVER NEXT 6 MOS.    9. START TAKING A MULTIVITAMIN, VITAMIN B12, AND VITAMIN D3 2000 IU DAILY.   ADDITIONAL SUPPLEMENTS TO DECREASE CRAVING AND TO LOSE WEIGHT:    1. CINNAMON 500 MG EVERY AM PRIOR TO FIRST MEAL    2. CHROMIUM 400-500 MG WITH MEALS TWICE DAILY    3. GREEN TEA EXTRACT ONE DAILY   COMPLETE  UPPER ENDOSCOPY TO STRETCH YOUR ESOPHAGUS.  FOLLOW UP IN 6 MOS.    Lifestyle and home remedies TO MANAGE REFLUX/CHEST PAIN  You may eliminate or reduce the frequency of heartburn by making the following lifestyle changes:  . Control your weight. Being  overweight is a major risk factor for heartburn and GERD. Excess pounds put pressure on your abdomen, pushing up your stomach and causing acid to back up into your esophagus.   . Eat smaller meals. 4 TO 6 MEALS A DAY. This reduces pressure on the lower esophageal sphincter, helping to prevent the valve from opening and acid from washing back into your esophagus.   Rita Stephens your belt. Clothes that fit tightly around your waist put pressure on your abdomen and the lower esophageal sphincter.   . Eliminate heartburn triggers. Everyone has specific triggers. Common triggers such as fatty or fried foods, spicy food, tomato sauce, carbonated beverages, alcohol, chocolate, mint, garlic, onion, caffeine and nicotine may make heartburn worse.   Rita Stephens Kitchen Avoid stooping or bending. Tying your shoes is OK. Bending over for longer periods to weed your garden isn't, especially soon after eating.   . Don't lie down after a meal. Wait at least three to four hours after eating before going to bed, and don't lie down right after eating.   Rita Stephens Kitchen PUT THE HEAD OF YOUR BED ON 6 INCH BLOCKS.   Alternative medicine . Several home remedies exist for treating GERD, but they provide  only temporary relief. They include drinking baking soda (sodium bicarbonate) added to water or drinking other fluids such as baking soda mixed with cream of tartar and water. . Although these liquids create temporary relief by neutralizing, washing away or buffering acids, eventually they aggravate the situation by adding gas and fluid to your stomach, increasing pressure and causing more acid reflux. Further, adding more sodium to your diet may increase your blood pressure and add stress to your heart, and excessive bicarbonate ingestion can alter the acid-base balance in your body.

## 2019-01-19 NOTE — Assessment & Plan Note (Signed)
SYMPTOMS NOT IDEALLY CONTROLLED.  COMPLETE  UPPER ENDOSCOPY TO STRETCH YOUR ESOPHAGUS W/ [facility] DUE TO FAILED CONSCIOUS SEDATION.. FOLLOW UP IN 6 MOS.

## 2019-01-19 NOTE — Assessment & Plan Note (Signed)
SYMPTOMS NOT CONTROLLED AND DUE TO PT BEING OFF PPI MOE THAN LIKELY. PT OFF PPI AND BMI > 30.  TO CONTROL HEARTBURN:   1. DRINK WATER TO KEEP YOUR URINE LIGHT YELLOW.    2. CONTINUE YOUR WEIGHT LOSS EFFORTS. EA 4-6 SMALL MEALS/SNACKS DAILY NOT JUST ONE MEAL.    3. Avoid reflux triggers. SEE INFO BELOW.    5. STRICTLY FOLLOW A LOW FAT DIET. AVOID FRIED FOODS. AVOID FAST FOOD.    6. START PROTONIX. TAKE 30 MINUTES PRIOR TO YOUR FIRST MEAL EVERY DAY.    7. USE PEPCID OR TAGAMET FOR BREAKTHROUGH HEARTBURN/REFLUX.    8. CUT SMOKING IN HALF.   To have more energy, and to lose weight:  EAT TO LIVE AND THINK OF FOOD AS MEDICINE. 75% OF YOUR PLATE SHOULD BE FRUITS/VEGGIES.     1. I RECOMMEND YOU READ AND FOLLOW RECOMMENDATIONS BY DR. MARK HYMAN, "10-DAY DETOX DIET".    2. If you must eat bread, EAT EZEKIEL BREAD. IT IS IN THE FROZEN SECTION OF THE GROCERY STORE.    3. DRINK WATER WITH FRUIT OR CUCUMBER ADDED. YOUR URINE LIGHT SHOULD BE LIGHT YELLOW. AVOID SODA, GATORADE, ENERGY DRINKS, OR DIET SODA.     4. AVOID HIGH FRUCTOSE CORN SYRUP.     5. DO NOT chew SUGAR FREE GUM OR USE ARTIFICIAL SWEETENERS. IF NEEDED USE STEVIA AS A SWEETENER.    6. DO NOT EAT ENRICHED WHEAT FLOUR, PASTA, RICE, OR CEREAL.    7. ONLY EAT WILD CAUGHT SEAFOOD, GRASS FED BEEF OR CHICKEN, PORK FROM PASTURE RAISE PIGS, OR EGGS FROM PASTURE RAISED CHICKENS.    8. PRACTICE CHAIR YOGA FOR 15-30 MINS 3 OR 4 TIMES A WEEK AND PROGRESS TO HATHA YOGA OVER NEXT 6 MOS.    9. START TAKING A MULTIVITAMIN, VITAMIN B12, AND VITAMIN D3 2000 IU DAILY.   ADDITIONAL SUPPLEMENTS TO DECREASE CRAVING AND TO LOSE WEIGHT:    1. CINNAMON 500 MG EVERY AM PRIOR TO FIRST MEAL    2. CHROMIUM 400-500 MG WITH MEALS TWICE DAILY    3. GREEN TEA EXTRACT ONE DAILY   COMPLETE  UPPER ENDOSCOPY TO STRETCH YOUR ESOPHAGUS.  FOLLOW UP IN 6 MOS.

## 2019-01-19 NOTE — Progress Notes (Signed)
Subjective:    Patient ID: Rita Stephens, female    DOB: 02-07-1956, 63 y.o.   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 FLATULENCE IS A PROBLEMS. MILK: NO. CHEESE: NONE TO A PIECE OR TWO. ICE CREAM: NEVER. SUGAR FREE GUM: NONE. CAFFEINE FREE DIET MT DEW: 3 24 OZ/DAY. SMOKES: 1 PK/DAY. DINNER: NOTHING. LUNCH: NOTHING. BREAKFAST: EGG & BACON SANDWICH. ETOH: NO. 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 BMs: < 1X/WEEK. DIARRHEA: NO. USED TO HAVE IBS BUT "IT WENT AWAY". APPETITE:  NOT REALLY HUNGRY THROUGHOUT THE DAY. SNACKS: NOTHING. NAUSEA: WITH GAS. SOB: WITH THE GAS IN CHEST, GETTING GROCERIES AND WALKING BACK TO THE CAR. PROBLEMS SWALLOWING: 1-2X/MO(SOLIDS). Problems with sedation: "TWILIGHT ZONE DOESN'T WORK". HEARTBURN: AT LEAST 4/7 DAYS A WEEK. HAD COLONOSCOPY: AT [facility]-SCREENING(?AGE 48). LAST EGD WITH NUR/JENKINS.  PT DENIES FEVER, CHILLS, HEMATOCHEZIA, HEMATEMESIS, vomiting, melena, diarrhea, CHEST PAIN, CHANGE IN BOWEL IN HABITS, OR abdominal pain.  Past Medical History:  Diagnosis Date  . Anxiety   . Chronic insomnia 07/27/2018  . Chronic pain   . Common migraine with intractable migraine 07/27/2018  . Essential hypertension   . 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 History of cardiac catheterization    Normal coronaries November 2016  . History of pneumonia   . Hypothyroidism   . Migraine   . Ovarian cyst   . Spinal stenosis   . Takotsubo syndrome    November 2016  . Thrombocytosis ([place]) 01/24/2016   Past Surgical History:  Procedure Laterality Date  . CARDIAC CATHETERIZATION N/A 02/23/2015   Procedure: Left Heart Cath and Coronary Angiography;  Surgeon: Jettie Booze, MD;  Location: [facility];  Service: Cardiovascular;  Laterality: N/A;  . CHOLECYSTECTOMY    . DOPPLER ECHOCARDIOGRAPHY    . GUM SURGERY     Removed cyst  . LEEP     Allergies  Allergen Reactions  . Amitriptyline Itching   Allergy identified by daughter  . Actifed Cold-Allergy [Chlorpheniramine-Phenyleph Er] Other (See Comments)    ALTERS MENTAL STATUS  . Sudafed [Pseudoephedrine Hcl] Other (See Comments)    Climbs walls  . Other Palpitations    Topamax   Current Outpatient Medications  Medication Sig    . ALPRAZolam (XANAX) 1 MG tablet Take 3 mg by mouth at bedtime.     [name] Kitchen aspirin EC 81 MG EC tablet Take 1 tablet (81 mg total) by mouth daily.    Eduard Roux (AIMOVIG) 140 MG/ML SOAJ Inject 140 mg into the skin every 30 (thirty) days.    [name] Kitchen ibuprofen (ADVIL,MOTRIN) 200 MG tablet Take 400-800 mg by mouth every 6 (six) hours as needed for moderate pain.    [name] Kitchen levocetirizine (XYZAL) 5 MG tablet 5 mg.     . SYNTHROID 88 MCG tablet Take 88 mcg by mouth daily before breakfast.    . Melatonin 10 MG TABS Take 50 mg by mouth at bedtime.    . SEROQUEL 100 MG tablet Take 3 tablets (300 mg total) by mouth at bedtime.    [name] Kitchen ZONEGRAN 50 MG capsule Take 2 capsules by mouth 2 (two) times daily.    [name] Kitchen nystatin cream (MYCOSTATIN)      Family History  Problem Relation Age of Onset  . Hypertension Father   . Breast cancer Mother        metastatic to brain  . Migraines Sister   . Migraines  Brother   . Heart attack Maternal Grandfather   . Seizures Neg Hx   . Colon cancer Neg Hx   . Colon polyps Neg Hx     Social History   Socioeconomic History  . Marital status: Married    Spouse name: Not on file  . Number of children: 1  . Years of education: HS  . Highest education level: Not on file  Occupational History  . Occupation: N/A  Social Needs  . Financial resource strain: Not on file  . Food insecurity    Worry: Not on file    Inability: Not on file  . Transportation needs    Medical: Not on file    Non-medical: Not on file  Tobacco Use  . Smoking status: Current Every Day Smoker    Packs/day: 0.75    Years: 30.00    Pack years: 22.50    Types: Cigarettes  . Smokeless tobacco: Never Used  Substance  and Sexual Activity  . Alcohol use: Yes    Alcohol/week: 0.0 standard drinks  . Drug use: No  . Sexual activity: Not on file  Lifestyle  . Physical activity    Days per week: Not on file    Minutes per session: Not on file  . Stress: Not on file  Relationships  . Social Herbalist on phone: Not on file    Gets together: Not on file    Attends religious service: Not on file    Active member of club or organization: Not on file    Attends meetings of clubs or organizations: Not on file    Relationship status: Not on file  Other Topics Concern  . Not on file  Social History Narrative   She lives with husband and daughter.  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   Patient does not drink caffeine.   Patient is right handed.    SPENDS FREE TIME: WITH GRAND KIDS, READ([employer]), [place], [place], SINGS.   Review of Systems PER HPI OTHERWISE ALL SYSTEMS ARE NEGATIVE.    Objective:   Physical Exam Vitals signs reviewed.  Constitutional:      General: She is not in acute distress.    Appearance: She is well-developed.  HENT:     Head: Normocephalic and atraumatic.     Mouth/Throat:     Comments: MASK IN PLACE Eyes:     General: No scleral icterus.    Pupils: Pupils are equal, round, and reactive to light.  Neck:     Musculoskeletal: Normal range of motion and neck supple.  Cardiovascular:     Rate and Rhythm: Normal rate and regular rhythm.     Heart sounds: Normal heart sounds.  Pulmonary:     Effort: Pulmonary effort is normal. No respiratory distress.     Breath sounds: Normal breath sounds.  Abdominal:     General: Bowel sounds are normal. There is no distension.     Palpations: Abdomen is soft.     Tenderness: There is no abdominal tenderness.  Musculoskeletal:     Right lower leg: No edema.     Left lower leg: No edema.  Lymphadenopathy:     Cervical: No cervical adenopathy.  Skin:    General: Skin is warm and dry.   Neurological:     General: No focal deficit present.     Mental Status: She is alert and oriented to person, place, and time.  Psychiatric:        Mood and Affect: Mood normal.        Thought Content: Thought content normal.        Judgment: Judgment normal.        Assessment & Plan:

## 2019-01-19 NOTE — Patient Instructions (Signed)
PA for EGD/DIL submitted via [facility] website. Case approved. PA# [id number], valid 03/22/19-06/20/19.

## 2019-03-21 ENCOUNTER — Other Ambulatory Visit (HOSPITAL_COMMUNITY)
Admission: RE | Admit: 2019-03-21 | Discharge: 2019-03-21 | Disposition: A | Discharge status: Home / Self Care | Payer: 59 | Source: Ambulatory Visit | Attending: Gastroenterology | Admitting: Gastroenterology

## 2019-03-21 ENCOUNTER — Encounter (HOSPITAL_COMMUNITY)
Admission: RE | Admit: 2019-03-21 | Discharge: 2019-03-21 | Disposition: A | Discharge status: Home / Self Care | Payer: 59 | Source: Ambulatory Visit | Attending: Gastroenterology | Admitting: Gastroenterology

## 2019-03-21 ENCOUNTER — Other Ambulatory Visit: Payer: Self-pay

## 2019-03-21 DIAGNOSIS — Z01812 Encounter for preprocedural laboratory examination: Secondary | ICD-10-CM | POA: Diagnosis not present

## 2019-03-21 DIAGNOSIS — Z20828 Contact with and (suspected) exposure to other viral communicable diseases: Secondary | ICD-10-CM | POA: Diagnosis not present

## 2019-03-21 LAB — SARS CORONAVIRUS 2 (TAT 6-24 HRS): SARS Coronavirus 2: NEGATIVE

## 2019-03-21 NOTE — Patient Instructions (Signed)
Rita Stephens  03/21/2019     @PREFPERIOPPHARMACY @   Your procedure is scheduled on 03/22/2019.  Report to [facility] at  1:00  P.M.  Call this number if you have problems the morning of surgery:  [phone]   Remember:  Follow the diet instructions given to you by Dr Oneida Alar office.                      Take these medicines the morning of surgery with A SIP OF WATER: Xanax(if needed), xyzal, levothyroxine, protonix, zonegran    Do not wear jewelry, make-up or nail polish.  Do not wear lotions, powders, or perfumes. Please wear deodorant and  Brush your teeth.  Do not shave 48 hours prior to surgery.  Men may shave face and neck.  Do not bring valuables to the hospital.  [facility] is not responsible for any belongings or valuables.  Contacts, dentures or bridgework may not be worn into surgery.  Leave your suitcase in the car.  After surgery it may be brought to your room.  For patients admitted to the hospital, discharge time will be determined by your treatment team.  Patients discharged the day of surgery will not be allowed to drive home.   Name and phone number of your driver:   family Special instructions:  None  Please read over the following fact sheets that you were given. Anesthesia Post-op Instructions and Care and Recovery After Surgery       Upper Endoscopy, Adult, Care After This sheet gives you information about how to care for yourself after your procedure. Your health care provider may also give you more specific instructions. If you have problems or questions, contact your health care provider. What can I expect after the procedure? After the procedure, it is common to have:  A sore throat.  Mild stomach pain or discomfort.  Bloating.  Nausea. Follow these instructions at home:   Follow instructions from your health care provider about what to eat or drink after your procedure.  Return to your normal activities as told by your  health care provider. Ask your health care provider what activities are safe for you.  Take over-the-counter and prescription medicines only as told by your health care provider.  Do not drive for 24 hours if you were given a sedative during your procedure.  Keep all follow-up visits as told by your health care provider. This is important. Contact a health care provider if you have:  A sore throat that lasts longer than one day.  Trouble swallowing. Get help right away if:  You vomit blood or your vomit looks like coffee grounds.  You have: ? A fever. ? Bloody, black, or tarry stools. ? A severe sore throat or you cannot swallow. ? Difficulty breathing. ? Severe pain in your chest or abdomen. Summary  After the procedure, it is common to have a sore throat, mild stomach discomfort, bloating, and nausea.  Do not drive for 24 hours if you were given a sedative during the procedure.  Follow instructions from your health care provider about what to eat or drink after your procedure.  Return to your normal activities as told by your health care provider. This information is not intended to replace advice given to you by your health care provider. Make sure you discuss any questions you have with your health care provider. Document Released: 10/07/2011 Document Revised: 09/29/2017 Document  Reviewed: 09/07/2017 Elsevier Patient Education  [place].  Esophageal Dilatation Esophageal dilatation, also called esophageal dilation, is a procedure to widen or open (dilate) a blocked or narrowed part of the esophagus. The esophagus is the part of the body that moves food and liquid from the mouth to the stomach. You may need this procedure if:  You have a buildup of scar tissue in your esophagus that makes it difficult, painful, or impossible to swallow. This can be caused by gastroesophageal reflux disease (GERD).  You have cancer of the esophagus.  There is a problem with how  food moves through your esophagus. In some cases, you may need this procedure repeated at a later time to dilate the esophagus gradually. Tell a health care provider about:  Any allergies you have.  All medicines you are taking, including vitamins, herbs, eye drops, creams, and over-the-counter medicines.  Any problems you or family members have had with anesthetic medicines.  Any blood disorders you have.  Any surgeries you have had.  Any medical conditions you have.  Any antibiotic medicines you are required to take before dental procedures.  Whether you are pregnant or may be pregnant. What are the risks? Generally, this is a safe procedure. However, problems may occur, including:  Bleeding due to a tear in the lining of the esophagus.  A hole (perforation) in the esophagus. What happens before the procedure?  Follow instructions from your health care provider about eating or drinking restrictions.  Ask your health care provider about changing or stopping your regular medicines. This is especially important if you are taking diabetes medicines or blood thinners.  Plan to have someone take you home from the hospital or clinic.  Plan to have a responsible adult care for you for at least 24 hours after you leave the hospital or clinic. This is important. What happens during the procedure?  You may be given a medicine to help you relax (sedative).  A numbing medicine may be sprayed into the back of your throat, or you may gargle the medicine.  Your health care provider may perform the dilatation using various surgical instruments, such as: ? Simple dilators. This instrument is carefully placed in the esophagus to stretch it. ? Guided wire bougies. This involves using an endoscope to insert a wire into the esophagus. A dilator is passed over this wire to enlarge the esophagus. Then the wire is removed. ? Balloon dilators. An endoscope with a small balloon at the end is  inserted into the esophagus. The balloon is inflated to stretch the esophagus and open it up. The procedure may vary among health care providers and hospitals. What happens after the procedure?  Your blood pressure, heart rate, breathing rate, and blood oxygen level will be monitored until the medicines you were given have worn off.  Your throat may feel slightly sore and numb. This will improve slowly over time.  You will not be allowed to eat or drink until your throat is no longer numb.  When you are able to drink, urinate, and sit on the edge of the bed without nausea or dizziness, you may be able to return home. Follow these instructions at home:  Take over-the-counter and prescription medicines only as told by your health care provider.  Do not drive for 24 hours if you were given a sedative during your procedure.  You should have a responsible adult with you for 24 hours after the procedure.  Follow instructions from your health  care provider about any eating or drinking restrictions.  Do not use any products that contain nicotine or tobacco, such as cigarettes and e-cigarettes. If you need help quitting, ask your health care provider.  Keep all follow-up visits as told by your health care provider. This is important. Get help right away if you:  Have a fever.  Have chest pain.  Have pain that is not relieved by medication.  Have trouble breathing.  Have trouble swallowing.  Vomit blood. Summary  Esophageal dilatation, also called esophageal dilation, is a procedure to widen or open (dilate) a blocked or narrowed part of the esophagus.  Plan to have someone take you home from the hospital or clinic.  For this procedure, a numbing medicine may be sprayed into the back of your throat, or you may gargle the medicine.  Do not drive for 24 hours if you were given a sedative during your procedure. This information is not intended to replace advice given to you by your  health care provider. Make sure you discuss any questions you have with your health care provider. Document Released: 05/29/2005 Document Revised: 03/20/2017 Document Reviewed: 02/10/2017 Elsevier Patient Education  2020 [place] After These instructions provide you with information about caring for yourself after your procedure. Your health care provider may also give you more specific instructions. Your treatment has been planned according to current medical practices, but problems sometimes occur. Call your health care provider if you have any problems or questions after your procedure. What can I expect after the procedure? After your procedure, you may:  Feel sleepy for several hours.  Feel clumsy and have poor balance for several hours.  Feel forgetful about what happened after the procedure.  Have poor judgment for several hours.  Feel nauseous or vomit.  Have a sore throat if you had a breathing tube during the procedure. Follow these instructions at home: For at least 24 hours after the procedure:      Have a responsible adult stay with you. It is important to have someone help care for you until you are awake and alert.  Rest as needed.  Do not: ? Participate in activities in which you could fall or become injured. ? Drive. ? Use heavy machinery. ? Drink alcohol. ? Take sleeping pills or medicines that cause drowsiness. ? Make important decisions or sign legal documents. ? Take care of children on your own. Eating and drinking  Follow the diet that is recommended by your health care provider.  If you vomit, drink water, juice, or soup when you can drink without vomiting.  Make sure you have little or no nausea before eating solid foods. General instructions  Take over-the-counter and prescription medicines only as told by your health care provider.  If you have sleep apnea, surgery and certain medicines can increase your  risk for breathing problems. Follow instructions from your health care provider about wearing your sleep device: ? Anytime you are sleeping, including during daytime naps. ? While taking prescription pain medicines, sleeping medicines, or medicines that make you drowsy.  If you smoke, do not smoke without supervision.  Keep all follow-up visits as told by your health care provider. This is important. Contact a health care provider if:  You keep feeling nauseous or you keep vomiting.  You feel light-headed.  You develop a rash.  You have a fever. Get help right away if:  You have trouble breathing. Summary  For several hours after  your procedure, you may feel sleepy and have poor judgment.  Have a responsible adult stay with you for at least 24 hours or until you are awake and alert. This information is not intended to replace advice given to you by your health care provider. Make sure you discuss any questions you have with your health care provider. Document Released: 07/29/2015 Document Revised: 07/06/2017 Document Reviewed: 07/29/2015 Elsevier Patient Education  2020 [employer].

## 2019-03-22 ENCOUNTER — Ambulatory Visit (HOSPITAL_COMMUNITY): Payer: 59 | Admitting: Anesthesiology

## 2019-03-22 ENCOUNTER — Other Ambulatory Visit: Payer: Self-pay

## 2019-03-22 ENCOUNTER — Ambulatory Visit (HOSPITAL_COMMUNITY)
Admission: RE | Admit: 2019-03-22 | Discharge: 2019-03-22 | Disposition: A | Discharge status: Home / Self Care | Payer: 59 | Attending: Gastroenterology | Admitting: Gastroenterology

## 2019-03-22 ENCOUNTER — Encounter (HOSPITAL_COMMUNITY)
Admission: RE | Disposition: A | Discharge status: Home / Self Care | Payer: Self-pay | Source: Home / Self Care | Attending: Gastroenterology

## 2019-03-22 ENCOUNTER — Encounter (HOSPITAL_COMMUNITY): Payer: Self-pay | Admitting: *Deleted

## 2019-03-22 DIAGNOSIS — I129 Hypertensive chronic kidney disease with stage 1 through stage 4 chronic kidney disease, or unspecified chronic kidney disease: Secondary | ICD-10-CM | POA: Insufficient documentation

## 2019-03-22 DIAGNOSIS — Z791 Long term (current) use of non-steroidal anti-inflammatories (NSAID): Secondary | ICD-10-CM | POA: Insufficient documentation

## 2019-03-22 DIAGNOSIS — K2970 Gastritis, unspecified, without bleeding: Secondary | ICD-10-CM | POA: Diagnosis not present

## 2019-03-22 DIAGNOSIS — F419 Anxiety disorder, unspecified: Secondary | ICD-10-CM | POA: Insufficient documentation

## 2019-03-22 DIAGNOSIS — K219 Gastro-esophageal reflux disease without esophagitis: Secondary | ICD-10-CM | POA: Insufficient documentation

## 2019-03-22 DIAGNOSIS — E039 Hypothyroidism, unspecified: Secondary | ICD-10-CM | POA: Diagnosis not present

## 2019-03-22 DIAGNOSIS — N189 Chronic kidney disease, unspecified: Secondary | ICD-10-CM | POA: Diagnosis not present

## 2019-03-22 DIAGNOSIS — T39315A Adverse effect of propionic acid derivatives, initial encounter: Secondary | ICD-10-CM | POA: Diagnosis not present

## 2019-03-22 DIAGNOSIS — Z79899 Other long term (current) drug therapy: Secondary | ICD-10-CM | POA: Insufficient documentation

## 2019-03-22 DIAGNOSIS — F17210 Nicotine dependence, cigarettes, uncomplicated: Secondary | ICD-10-CM | POA: Insufficient documentation

## 2019-03-22 DIAGNOSIS — R1310 Dysphagia, unspecified: Secondary | ICD-10-CM | POA: Diagnosis not present

## 2019-03-22 DIAGNOSIS — K222 Esophageal obstruction: Secondary | ICD-10-CM

## 2019-03-22 DIAGNOSIS — G40909 Epilepsy, unspecified, not intractable, without status epilepticus: Secondary | ICD-10-CM | POA: Insufficient documentation

## 2019-03-22 DIAGNOSIS — Z7982 Long term (current) use of aspirin: Secondary | ICD-10-CM | POA: Insufficient documentation

## 2019-03-22 DIAGNOSIS — I252 Old myocardial infarction: Secondary | ICD-10-CM | POA: Insufficient documentation

## 2019-03-22 DIAGNOSIS — K449 Diaphragmatic hernia without obstruction or gangrene: Secondary | ICD-10-CM | POA: Insufficient documentation

## 2019-03-22 DIAGNOSIS — K2960 Other gastritis without bleeding: Secondary | ICD-10-CM

## 2019-03-22 DIAGNOSIS — R1319 Other dysphagia: Secondary | ICD-10-CM

## 2019-03-22 DIAGNOSIS — Z79890 Hormone replacement therapy (postmenopausal): Secondary | ICD-10-CM | POA: Insufficient documentation

## 2019-03-22 DIAGNOSIS — T39015A Adverse effect of aspirin, initial encounter: Secondary | ICD-10-CM | POA: Insufficient documentation

## 2019-03-22 DIAGNOSIS — T39395A Adverse effect of other nonsteroidal anti-inflammatory drugs [NSAID], initial encounter: Secondary | ICD-10-CM

## 2019-03-22 HISTORY — PX: BIOPSY: SHX5522

## 2019-03-22 HISTORY — PX: ESOPHAGOGASTRODUODENOSCOPY (EGD) WITH PROPOFOL: SHX5813

## 2019-03-22 HISTORY — DX: Chronic kidney disease, unspecified: N18.9

## 2019-03-22 HISTORY — DX: Gastro-esophageal reflux disease without esophagitis: K21.9

## 2019-03-22 HISTORY — PX: SAVORY DILATION: SHX5439

## 2019-03-22 SURGERY — ESOPHAGOGASTRODUODENOSCOPY (EGD) WITH PROPOFOL
Anesthesia: General

## 2019-03-22 MED ORDER — ONDANSETRON HCL 4 MG/2ML IJ SOLN
INTRAMUSCULAR | Status: DC | PRN
  Administered 2019-03-22: 4 mg via INTRAVENOUS

## 2019-03-22 MED ORDER — LIDOCAINE VISCOUS HCL 2 % MT SOLN
OROMUCOSAL | Status: DC | PRN
  Administered 2019-03-22: 1 via OROMUCOSAL

## 2019-03-22 MED ORDER — GLYCOPYRROLATE PF 0.2 MG/ML IJ SOSY
PREFILLED_SYRINGE | INTRAMUSCULAR | Status: AC
  Filled 2019-03-22: qty 1

## 2019-03-22 MED ORDER — LIDOCAINE VISCOUS HCL 2 % MT SOLN
OROMUCOSAL | Status: AC
  Filled 2019-03-22: qty 15

## 2019-03-22 MED ORDER — HYDROCODONE-ACETAMINOPHEN 7.5-325 MG PO TABS: 1.0000 | ORAL_TABLET | Freq: Once | ORAL | Status: DC | PRN

## 2019-03-22 MED ORDER — PROPOFOL 10 MG/ML IV BOLUS
INTRAVENOUS | Status: DC | PRN
  Administered 2019-03-22 (×2): 50 ug via INTRAVENOUS

## 2019-03-22 MED ORDER — ONDANSETRON HCL 4 MG/2ML IJ SOLN
INTRAMUSCULAR | Status: AC
  Filled 2019-03-22: qty 2

## 2019-03-22 MED ORDER — GLYCOPYRROLATE 0.2 MG/ML IJ SOLN
INTRAMUSCULAR | Status: DC | PRN
  Administered 2019-03-22: 0.2 mg via INTRAVENOUS

## 2019-03-22 MED ORDER — MINERAL OIL PO OIL
TOPICAL_OIL | ORAL | Status: AC
  Filled 2019-03-22: qty 30

## 2019-03-22 MED ORDER — PROPOFOL 500 MG/50ML IV EMUL
INTRAVENOUS | Status: DC | PRN
  Administered 2019-03-22: 300 ug/kg/min via INTRAVENOUS

## 2019-03-22 MED ORDER — HYDROMORPHONE HCL 1 MG/ML IJ SOLN: 0.2500 mg | INTRAMUSCULAR | Status: DC | PRN

## 2019-03-22 MED ORDER — MIDAZOLAM HCL 2 MG/2ML IJ SOLN: 0.5000 mg | Freq: Once | INTRAMUSCULAR | Status: DC | PRN

## 2019-03-22 MED ORDER — LACTATED RINGERS IV SOLN
INTRAVENOUS | Status: DC
  Administered 2019-03-22: 14:00:00 via INTRAVENOUS

## 2019-03-22 MED ORDER — LIDOCAINE VISCOUS HCL 2 % MT SOLN: 5.0000 mL | Freq: Once | OROMUCOSAL | Status: DC

## 2019-03-22 MED ORDER — PROMETHAZINE HCL 25 MG/ML IJ SOLN: 6.2500 mg | INTRAMUSCULAR | Status: DC | PRN

## 2019-03-22 NOTE — Anesthesia Preprocedure Evaluation (Addendum)
Anesthesia Evaluation  Patient identified by MRN, date of birth, ID band Patient awake    Reviewed: Allergy & Precautions, NPO status , Patient's Chart, lab work & pertinent test results  Airway Mallampati: II  TM Distance: >3 FB Neck ROM: Full    Dental no notable dental hx. (+) Missing   Pulmonary neg pulmonary ROS, Current SmokerPatient did not abstain from smoking.,  Smoked today   Pulmonary exam normal breath sounds clear to auscultation       Cardiovascular Exercise Tolerance: Good hypertension, Pt. on medications + Past MI  Normal cardiovascular examI Rhythm:Regular Rate:Normal  H/o MI per chart -chart review called it TAkotsubo syndrome  Reports can walk a half mile without stopping    Neuro/Psych  Headaches, Seizures -, Well Controlled,  Anxiety On meds -very evasive historian  negative psych ROS   GI/Hepatic Neg liver ROS, GERD  Medicated and Controlled,  Endo/Other  Hypothyroidism   Renal/GU Renal diseasePt with known low Na. Unknown etiology -sees kidney doctor -denies any dialysis  negative genitourinary   Musculoskeletal negative musculoskeletal ROS (+)   Abdominal   Peds negative pediatric ROS (+)  Hematology negative hematology ROS (+)   Anesthesia Other Findings   Reproductive/Obstetrics negative OB ROS                            Anesthesia Physical Anesthesia Plan  ASA: III  Anesthesia Plan: General   Post-op Pain Management:    Induction: Intravenous  PONV Risk Score and Plan: 2 and Propofol infusion, TIVA and Treatment may vary due to age or medical condition  Airway Management Planned: Simple Face Mask and Nasal Cannula  Additional Equipment:   Intra-op Plan:   Post-operative Plan:   Informed Consent: I have reviewed the patients History and Physical, chart, labs and discussed the procedure including the risks, benefits and alternatives for the proposed  anesthesia with the patient or authorized representative who has indicated his/her understanding and acceptance.     Dental advisory given  Plan Discussed with: CRNA  Anesthesia Plan Comments: (Plan Full PPE use  Plan GA with GETA as needed d/w pt -WTP with same after Q&A  Pt evasive historian-d/w pt if shes eaten without telling us, that increases risk of needing ETT D/w pt in unlikely need for ETT, will do our best to safely get it out. D/w pt unlikely need for postprocedural ventilation. Voiced understanding -WTP as above )        Anesthesia Quick Evaluation

## 2019-03-22 NOTE — Anesthesia Postprocedure Evaluation (Signed)
Anesthesia Post Note  Patient: Rita Stephens  Procedure(s) Performed: ESOPHAGOGASTRODUODENOSCOPY (EGD) WITH PROPOFOL (N/A ) SAVORY DILATION (N/A ) BIOPSY  Patient location during evaluation: Endoscopy Anesthesia Type: General Level of consciousness: awake and alert Pain management: pain level controlled Vital Signs Assessment: post-procedure vital signs reviewed and stable Respiratory status: spontaneous breathing, nonlabored ventilation, respiratory function stable and patient connected to nasal cannula oxygen Cardiovascular status: blood pressure returned to baseline and stable Postop Assessment: no apparent nausea or vomiting Anesthetic complications: no     Last Vitals:  Vitals:   03/22/19 1347 03/22/19 1420  BP: (!) 169/89 (!) 112/93  Pulse:  79  Resp: 20 (!) 24  Temp: 36.9 C 36.5 C  SpO2: 97% 95%    Last Pain:  Vitals:   03/22/19 1420  TempSrc:   PainSc: 0-No pain                 Talitha Givens

## 2019-03-22 NOTE — Op Note (Signed)
[facility] Patient Name: Rita Stephens Procedure Date: 03/22/2019 1:44 PM MRN: [id number] Date of Birth: 1955/06/02 Attending MD: Barney Drain MD, MD CSN: [id number] Age: 63 Admit Type: Outpatient Procedure:                Upper GI endoscopy WITH ESOPHAGEAL DILATION/COLD                            FORCEPS BIOPSY Indications:              Dyspepsia, Dysphagia. HAD A DIET SODA 2 HRS PRIOR                            TO EGD. Providers:                Barney Drain MD, MD, Charlsie Quest. Theda Sers RN, RN,                            Aram Candela Referring MD:             Redmond School, MD Medicines:                Propofol per Anesthesia Complications:            No immediate complications. Estimated Blood Loss:     Estimated blood loss was minimal. Procedure:                Pre-Anesthesia Assessment:                           - Prior to the procedure, a History and Physical                            was performed, and patient medications and                            allergies were reviewed. The patient's tolerance of                            previous anesthesia was also reviewed. The risks                            and benefits of the procedure and the sedation                            options and risks were discussed with the patient.                            All questions were answered, and informed consent                            was obtained. Prior Anticoagulants: The patient has                            taken no previous anticoagulant or antiplatelet  agents except for NSAID medication. ASA Grade                            Assessment: II - A patient with mild systemic                            disease. After reviewing the risks and benefits,                            the patient was deemed in satisfactory condition to                            undergo the procedure. After obtaining informed                            consent, the endoscope was  passed under direct                            vision. Throughout the procedure, the patient's                            blood pressure, pulse, and oxygen saturations were                            monitored continuously. The [id number]-[id number] ([id number])                            scope was introduced through the mouth, and                            advanced to the second part of duodenum. The upper                            GI endoscopy was accomplished without difficulty.                            The patient tolerated the procedure well. Scope In: 2:05:06 PM Scope Out: 2:12:48 PM Total Procedure Duration: 0 hours 7 minutes 42 seconds  Findings:      One benign-appearing, intrinsic moderate (circumferential scarring or       stenosis; an endoscope may pass) stenosis was found. This stenosis       measured 1.4 cm (inner diameter). The stenosis was traversed. A       guidewire was placed and the scope was withdrawn. Dilation was performed       with a Savary dilator with mild resistance at 16 mm and 17 mm. NO HEME.      A small hiatal hernia was present.      Localized mild inflammation characterized by congestion (edema) and       erythema was found in the gastric antrum.       Biopsies(2;ANTRUM,1:INCISURA,2:BODY)were taken with a cold forceps for       Helicobacter pylori testing.      The examined duodenum was normal. Impression:               -  Benign-appearing esophageal STRICTURE DUE TO                            UNCONTROLELD GERD. Dilated.                           - Small hiatal hernia.                           - MILD Gastritis DUE TO ASA/IBUPROFEN. Biopsied. Moderate Sedation:      Per Anesthesia Care Recommendation:           - Patient has a contact number available for                            emergencies. The signs and symptoms of potential                            delayed complications were discussed with the                            patient. Return to normal activities  tomorrow.                            Written discharge instructions were provided to the                            patient.                           - Low fat diet. AVOID REFLUX TRIGGERS LIKE                            NICOTINE, SODA, AND CAFFEINE.                           - Continue present medications. PROTONIX QAM                            FOREVER TO PREVENT PUD AND TREAT GERD.                           - Await pathology results.                           - Return to GI office in 4 months. Procedure Code(s):        --- Professional ---                           [id number], Esophagogastroduodenoscopy, flexible,                            transoral; with insertion of guide wire followed by                            passage of dilator(s) through esophagus over guide  wire                           43239, 59, Esophagogastroduodenoscopy, flexible,                            transoral; with biopsy, single or multiple Diagnosis Code(s):        --- Professional ---                           K22.2, Esophageal obstruction                           K44.9, Diaphragmatic hernia without obstruction or                            gangrene                           K29.70, Gastritis, unspecified, without bleeding                           R10.13, Epigastric pain                           R13.10, Dysphagia, unspecified CPT copyright 2019 American Medical Association. All rights reserved. The codes documented in this report are preliminary and upon coder review may  be revised to meet current compliance requirements. Barney Drain, MD Barney Drain MD, MD 03/22/2019 2:22:29 PM This report has been signed electronically. Number of Addenda: 0

## 2019-03-22 NOTE — Transfer of Care (Signed)
Immediate Anesthesia Transfer of Care Note  Patient: TRACE CEDERBERG  Procedure(s) Performed: ESOPHAGOGASTRODUODENOSCOPY (EGD) WITH PROPOFOL (N/A ) SAVORY DILATION (N/A ) BIOPSY  Patient Location: PACU  Anesthesia Type:MAC and General  Level of Consciousness: awake, alert  and oriented  Airway & Oxygen Therapy: Patient Spontanous Breathing  Post-op Assessment: Report given to RN, Post -op Vital signs reviewed and stable and Patient moving all extremities X 4  Post vital signs: Reviewed and stable  Last Vitals:  Vitals Value Taken Time  BP    Temp    Pulse 78 03/22/19 1421  Resp 30 03/22/19 1421  SpO2 94 % 03/22/19 1421  Vitals shown include unvalidated device data.  Last Pain:  Vitals:   03/22/19 1347  TempSrc: Oral  PainSc: 0-No pain         Complications: No apparent anesthesia complications

## 2019-03-22 NOTE — H&P (Signed)
Primary Care Physician:  Redmond School, MD Primary Gastroenterologist:  Dr. Oneida Alar  Pre-Procedure History & Physical: HPI:  Rita Stephens is a 63 y.o. female here for [facility].  Past Medical History:  Diagnosis Date  . Anxiety   . Chronic insomnia 07/27/2018  . Chronic kidney disease   . Chronic pain   . Common migraine with intractable migraine 07/27/2018  . Essential hypertension   . 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 GERD (gastroesophageal reflux disease)   . History of cardiac catheterization    Normal coronaries November 2016  . History of pneumonia   . Hypothyroidism   . Migraine   . Ovarian cyst   . Pneumonia   . Spinal stenosis   . Takotsubo syndrome    November 2016  . Thrombocytosis ([place]) 01/24/2016    Past Surgical History:  Procedure Laterality Date  . CARDIAC CATHETERIZATION N/A 02/23/2015   Procedure: Left Heart Cath and Coronary Angiography;  Surgeon: Jettie Booze, MD;  Location: [facility];  Service: Cardiovascular;  Laterality: N/A;  . CHOLECYSTECTOMY    . DOPPLER ECHOCARDIOGRAPHY    . GUM SURGERY     Removed cyst  . LEEP      Prior to Admission medications   Medication Sig Start Date End Date Taking? Authorizing Provider  ALPRAZolam Duanne Moron) 1 MG tablet Take 3 mg by mouth at bedtime.    Yes [provider]  aspirin EC 81 MG EC tablet Take 1 tablet (81 mg total) by mouth daily. 02/25/15  Yes Bhagat, Bhavinkumar, PA  ibuprofen (ADVIL,MOTRIN) 200 MG tablet Take 400-800 mg by mouth every 6 (six) hours as needed for moderate pain.   Yes [provider]  levothyroxine (SYNTHROID, LEVOTHROID) 88 MCG tablet Take 88 mcg by mouth daily before breakfast.   Yes [provider]  Melatonin 10 MG TABS Take 50 mg by mouth at bedtime.   Yes [provider]  nystatin cream (MYCOSTATIN)  11/03/18  Yes [provider]  potassium chloride (KLOR-CON) 10 MEQ tablet Take 10 mEq by mouth 2 (two) times daily.   Yes  [provider]  QUEtiapine (SEROQUEL) 100 MG tablet Take 3 tablets (300 mg total) by mouth at bedtime. 12/06/18  Yes Suzzanne Cloud, NP  zonisamide (ZONEGRAN) 50 MG capsule Take 2 capsules (100 mg total) by mouth 2 (two) times daily. 12/06/18  Yes Suzzanne Cloud, NP  Erenumab-aooe (AIMOVIG) 140 MG/ML SOAJ Inject 140 mg into the skin every 30 (thirty) days. 12/06/18   Suzzanne Cloud, NP  levocetirizine (XYZAL) 5 MG tablet 5 mg.  11/15/18   [provider]  pantoprazole (PROTONIX) 40 MG tablet 1 po 30 mins prior to first meal 01/19/19   Danie Binder, MD    Allergies as of 01/19/2019 - Review Complete 01/19/2019  Allergen Reaction Noted  . Amitriptyline Itching 02/23/2015  . Actifed cold-allergy [chlorpheniramine-phenyleph er] Other (See Comments) 09/17/2012  . Sudafed [pseudoephedrine hcl] Other (See Comments) 05/06/2011  . Other Palpitations 09/17/2012    Family History  Problem Relation Age of Onset  . Hypertension Father   . Breast cancer Mother        metastatic to brain  . Migraines Sister   . Migraines Brother   . Heart attack Maternal Grandfather   . Seizures Neg Hx   . Colon cancer Neg Hx   . Colon polyps Neg Hx     Social History   Socioeconomic History  . Marital status: Married  Spouse name: Not on file  . Number of children: 1  . Years of education: HS  . Highest education level: Not on file  Occupational History  . Occupation: N/A  Social Needs  . Financial resource strain: Not on file  . Food insecurity    Worry: Not on file    Inability: Not on file  . Transportation needs    Medical: Not on file    Non-medical: Not on file  Tobacco Use  . Smoking status: Current Every Day Smoker    Packs/day: 0.75    Years: 30.00    Pack years: 22.50    Types: Cigarettes  . Smokeless tobacco: Never Used  Substance and Sexual Activity  . Alcohol use: Yes    Alcohol/week: 0.0 standard drinks  . Drug use: No  . Sexual activity: Not on file   Lifestyle  . Physical activity    Days per week: Not on file    Minutes per session: Not on file  . Stress: Not on file  Relationships  . Social Herbalist on phone: Not on file    Gets together: Not on file    Attends religious service: Not on file    Active member of club or organization: Not on file    Attends meetings of clubs or organizations: Not on file    Relationship status: Not on file  . Intimate partner violence    Fear of current or ex partner: Not on file    Emotionally abused: Not on file    Physically abused: Not on file    Forced sexual activity: Not on file  Other Topics Concern  . Not on file  Social History Narrative   She lives with husband and daughter.  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   Patient does not drink caffeine.   Patient is right handed.    SPENDS FREE TIME: WITH GRAND KIDS, READ([employer]), [place], [place], [place].    Review of Systems: See HPI, otherwise negative ROS   Physical Exam: There were no vitals taken for this visit. General:   Alert,  pleasant and cooperative in NAD Head:  Normocephalic and atraumatic. Neck:  Supple; Lungs:  Clear throughout to auscultation.    Heart:  Regular rate and rhythm. Abdomen:  Soft, nontender and nondistended. Normal bowel sounds, without guarding, and without rebound.   Neurologic:  Alert and  oriented x4;  grossly normal neurologically.  Impression/Plan:    DYSPHAGIA/dyspepsia  PLAN:  EGD/DIL TODAY. DISCUSSED PROCEDURE, BENEFITS, & RISKS: < 1% chance of medication reaction, aspiration, bleeding, OR perforation.

## 2019-03-22 NOTE — Progress Notes (Signed)
CC'D TO PCP °

## 2019-03-22 NOTE — Discharge Instructions (Signed)
I dilated your esophagus. I DID SEE A DEFINITE NARROWING IN YOUR esophagus. You have gastritis due to ibuprofen. I biopsied your stomach.   TO CONTROL HEARTBURN:   1. DRINK WATER TO KEEP YOUR URINE LIGHT YELLOW.    2. CONTINUE YOUR WEIGHT LOSS EFFORTS. EAT 4-6 SMALL MEALS/SNACKS DAILY NOT JUST ONE MEAL.    3. Avoid reflux triggers LIKE SODA AND CAFFEINE. SEE ADDITIONAL INFO BELOW.    5. STRICTLY FOLLOW A LOW FAT DIET. AVOID FRIED FOODS. AVOID FAST FOOD.    6. CONTINUE PROTONIX. TAKE 30 MINUTES PRIOR TO YOUR FIRST MEAL EVERY DAY FOREVER.    7. USE PEPCID OR TAGAMET FOR BREAKTHROUGH HEARTBURN/REFLUX.    8. CUT SMOKING IN HALF.   YOUR BIOPSY RESULTS WILL BE BACK IN 5 BUSINESS DAYS.  FOLLOW UP IN 4 MOS E30 DYSPHAGIA/DYSPEPSIA.  UPPER ENDOSCOPY AFTER CARE Read the instructions outlined below and refer to this sheet in the next week. These discharge instructions provide you with general information on caring for yourself after you leave the hospital. While your treatment has been planned according to the most current medical practices available, unavoidable complications occasionally occur. If you have any problems or questions after discharge, call DR. Samadhi Mahurin, [phone].  ACTIVITY  You may resume your regular activity, but move at a slower pace for the next 24 hours.   Take frequent rest periods for the next 24 hours.   Walking will help get rid of the air and reduce the bloated feeling in your belly (abdomen).   No driving for 24 hours (because of the medicine (anesthesia) used during the test).   You may shower.   Do not sign any important legal documents or operate any machinery for 24 hours (because of the anesthesia used during the test).    NUTRITION  Drink plenty of fluids.   You may resume your normal diet as instructed by your doctor.   Begin with a light meal and progress to your normal diet. Heavy or fried foods are harder to digest and may make you feel  sick to your stomach (nauseated).   Avoid alcoholic beverages for 24 hours or as instructed.    MEDICATIONS  You may resume your normal medications.   WHAT YOU CAN EXPECT TODAY  Some feelings of bloating in the abdomen.   Passage of more gas than usual.    IF YOU HAD A BIOPSY TAKEN DURING THE UPPER ENDOSCOPY:  Eat a soft diet IF YOU HAVE NAUSEA, BLOATING, ABDOMINAL PAIN, OR VOMITING.    FINDING OUT THE RESULTS OF YOUR TEST Not all test results are available during your visit. DR. Oneida Alar WILL CALL YOU WITHIN 14 DAYS OF YOUR PROCEDUE WITH YOUR RESULTS. Do not assume everything is normal if you have not heard from DR. Mann Skaggs, CALL HER OFFICE AT [phone].  SEEK IMMEDIATE MEDICAL ATTENTION AND CALL THE OFFICE: [phone] IF:  You have more than a spotting of blood in your stool.   Your belly is swollen (abdominal distention).   You are nauseated or vomiting.   You have a temperature over 101F.   You have abdominal pain or discomfort that is severe or gets worse throughout the day.  DYSPHAGIA DYSPHAGIA can be caused by stomach acid backing up into the tube that carries food from the mouth down to the stomach (lower esophagus).  TREATMENT There are a number of medicines used to treat DYSPHAGIA including: Antacids.  Proton-pump inhibitors: PROTONIX TAGAMET/PEPCID    Lifestyle and home remedies to  control REFLUX and regurgitation  You may eliminate or reduce the frequency of heartburn by making the following lifestyle changes:   Control your weight. Being overweight is a major risk factor for heartburn and GERD. Excess pounds put pressure on your abdomen, pushing up your stomach and causing acid to back up into your esophagus.    Eat smaller meals. 4 TO 6 MEALS A DAY. This reduces pressure on the lower esophageal sphincter, helping to prevent the valve from opening and acid from washing back into your esophagus.    Loosen your belt. Clothes that fit tightly  around your waist put pressure on your abdomen and the lower esophageal sphincter.     Eliminate heartburn triggers. Everyone has specific triggers. Common triggers such as fatty or fried foods, spicy food, tomato sauce, carbonated beverages, alcohol, chocolate, mint, garlic, onion, caffeine and nicotine may make heartburn worse.    Avoid stooping or bending. Tying your shoes is OK. Bending over for longer periods to weed your garden isn't, especially soon after eating.    Don't lie down after a meal. Wait at least three to four hours after eating before going to bed, and don't lie down right after eating.   Alternative medicine  Several home remedies exist for treating GERD, but they provide only temporary relief. They include drinking baking soda (sodium bicarbonate) added to water or drinking other fluids such as baking soda mixed with cream of tartar and water.  Although these liquids create temporary relief by neutralizing, washing away or buffering acids, eventually they aggravate the situation by adding gas and fluid to your stomach, increasing pressure and causing more acid reflux. Further, adding more sodium to your diet may increase your blood pressure and add stress to your heart, and excessive bicarbonate ingestion can alter the acid-base balance in your body.

## 2019-03-24 ENCOUNTER — Other Ambulatory Visit: Payer: Self-pay

## 2019-03-24 ENCOUNTER — Encounter (HOSPITAL_COMMUNITY): Payer: Self-pay | Admitting: Gastroenterology

## 2019-03-24 ENCOUNTER — Telehealth: Payer: Self-pay | Admitting: Gastroenterology

## 2019-03-24 LAB — SURGICAL PATHOLOGY

## 2019-03-24 NOTE — Telephone Encounter (Signed)
Please call pt. HER stomach Bx shows gastritis DUE TO IBUPROFEN.  CONTINUE PROTONIX. TAKE 30 MINUTES PRIOR TO BREAKFAST. FOLLOW UP IN 4 MOS.

## 2019-03-24 NOTE — Telephone Encounter (Signed)
Patient scheduled.

## 2019-03-25 ENCOUNTER — Telehealth: Payer: Self-pay | Admitting: Neurology

## 2019-03-25 NOTE — Telephone Encounter (Signed)
Pt called in and stated when she has migraines she feels as if she maybe having seizures. She is requesting a call back to discuss

## 2019-03-28 MED ORDER — ZONISAMIDE 50 MG PO CAPS: ORAL_CAPSULE | ORAL | 1 refills | Status: DC

## 2019-03-28 NOTE — Addendum Note (Signed)
Addended by: Suzzanne Cloud on: 03/28/2019 03:24 PM   Modules accepted: Orders

## 2019-03-28 NOTE — Telephone Encounter (Signed)
Spoke with the patient and she stated that she was in the bed and her husband told her that she had gotten up and went to the den, there was a step that she missed and she fell and just laid on the floor. She stated that her husband told her that she wasn't responding to him when she was trying to wake her up. She then stated that she when she woke up she was in bed. She does not remember getting up from the floor and walking to the bedroom. She then stated that for the next few days she had a foggy kind of mindset. She mentioned that she typically has one of these spells once a year but there not as bad. She reached out to her pcp and they told her to contact us. Patient stated that she currently feels fine. Please advise.

## 2019-03-28 NOTE — Telephone Encounter (Signed)
Unable to get in contact with the pateint. LVM asking her to return my call so we can discuss her concerns. Office number was provided.

## 2019-03-28 NOTE — Telephone Encounter (Signed)
Pt returning call please call back °

## 2019-03-28 NOTE — Telephone Encounter (Signed)
I called the patient. She isn't sure if she had a seizure. She says things like this have been happening once a year. She says 6 weeks she had episode where her husband said she got out of the bed, was walking down a step to the den, missed a step, and fell down. He tried to get her up, but she was asleep. He left her in the floor, and went back to bed. When she got up the next morning she was back in the bed. The next morning, she felt she had a brain fog, lasting all week. She would also see her daughter, but she wasn't there. She has been having an episode like this once a year. She has attributed it to stress. She says she is sleeping better, so it isn't insomnia causing this. She needs to schedule her sleep study. She isn't sure that these are seizures. She hasn't missed any doses of Zonegran. She will increase Zonegran 150 mg twice daily, and call to schedule sleep study.  She will let me know if the episodes continue to occur.  She has a regular follow-up appointment scheduled in February.  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

## 2019-03-29 NOTE — Telephone Encounter (Signed)
Pt is aware.  

## 2019-03-29 NOTE — Telephone Encounter (Signed)
LMOM to call.

## 2019-04-13 ENCOUNTER — Other Ambulatory Visit: Payer: Self-pay | Admitting: Neurology

## 2019-05-09 ENCOUNTER — Encounter: Payer: Self-pay | Admitting: *Deleted

## 2019-05-09 NOTE — Progress Notes (Signed)
Mailed letter to pt with appointment change.

## 2019-06-08 ENCOUNTER — Other Ambulatory Visit: Payer: Self-pay

## 2019-06-08 ENCOUNTER — Ambulatory Visit: Payer: 59 | Admitting: Adult Health

## 2019-06-08 ENCOUNTER — Encounter: Payer: Self-pay | Admitting: Adult Health

## 2019-06-08 VITALS — BP 142/86 | HR 94 | Temp 96.9°F | Ht 62.0 in | Wt 164.6 lb

## 2019-06-08 DIAGNOSIS — R443 Hallucinations, unspecified: Secondary | ICD-10-CM | POA: Diagnosis not present

## 2019-06-08 DIAGNOSIS — R569 Unspecified convulsions: Secondary | ICD-10-CM

## 2019-06-08 DIAGNOSIS — G43719 Chronic migraine without aura, intractable, without status migrainosus: Secondary | ICD-10-CM | POA: Diagnosis not present

## 2019-06-08 DIAGNOSIS — F5104 Psychophysiologic insomnia: Secondary | ICD-10-CM

## 2019-06-08 NOTE — Progress Notes (Signed)
I have read the note, and I agree with the clinical assessment and plan.  Rita Stephens K Rita Stephens   

## 2019-06-08 NOTE — Progress Notes (Signed)
PATIENT: Rita Stephens DOB: [birth year]  REASON FOR VISIT: follow up HISTORY FROM: patient  HISTORY OF PRESENT ILLNESS: Today 06/08/19: Rita Stephens is a 64 year old female with a history of chronic insomnia, seizures and intractable headaches.  She reports that her headaches have improved with Aimovig but reports that the frequency of her headaches varies.  She states that she is only had 1 grand mal seizure.  She continues on Zonegran 150 mg twice a day.  She states that she may go 3 to 4 days without sleeping at all.  She states typically after that she will have a period of 4 to 5 days that she feels that she is "out of it."  She states that during this time she may sleep at night but during the day she feels foggy headed.  Reports that there are times that she sees her daughter but then she is gone. ( daughter was never there) she also may see worms coming out of the carpet.  Reports that after several days this tends to subside.  She continues to take Seroquel at bedtime.  She did have an evaluation by Dr. De Nurse who ordered sleep study however the patient has not proceeded with this.  She states that her PCP is ordering a home sleep study.  Returns today for an evaluation.  HISTORY 12/06/18  Rita Stephens is a 64 year old female with history of chronic insomnia and history of intractable headaches.  She has also had seizures in the past. She was recently started on Aimovig 140 mg monthly injection for her headaches. She has been doing well. She has had 3 Aimovig injections at this point. She is having less than 10 headaches a month. It really depends on the weather.  When she does have a headache she will take OTC medication with near complete resolution.  With Aimovig, she has had about 75% improvement in her headaches. She is also sleeping better. She is taking 2 mg Xanax at bedtime, Seroquel 300 mg at bedtime. She is sleeping well. She denies any recent seizures. She was scheduled for sleep study, but  it was cancelled due to the pandemic.  She has not been able to call back to reschedule.  She does report a new diagnosis of hiatal hernia and low sodium level.  She does take care of her grandchild during the day.  She presents today for follow-up unaccompanied.  For headaches, in the past she is tried Topamax, Cymbalta, Seroquel, Depakote, trazodone, and amitriptyline.  She is also tried Belsomra and Ambien in the past for sleep without benefit.  REVIEW OF SYSTEMS: Out of a complete 14 system review of symptoms, the patient complains only of the following symptoms, and all other reviewed systems are negative.  See HPI  ALLERGIES: Allergies  Allergen Reactions  . Amitriptyline Itching    Allergy identified by daughter  . Actifed Cold-Allergy [Chlorpheniramine-Phenyleph Er] Other (See Comments)    ALTERS MENTAL STATUS  . Sudafed [Pseudoephedrine Hcl] Other (See Comments)    Climbs walls  . Other Palpitations    Topamax    HOME MEDICATIONS: Outpatient Medications Prior to Visit  Medication Sig Dispense Refill  . ALPRAZolam (XANAX) 1 MG tablet Take 3 mg by mouth at bedtime.     [name] Kitchen aspirin EC 81 MG EC tablet Take 1 tablet (81 mg total) by mouth daily.    [name] Kitchen CINNAMON PO Take by mouth.    Eduard Roux (AIMOVIG) 140 MG/ML SOAJ Inject 140 mg into the  skin every 30 (thirty) days. 1 pen 5  . levocetirizine (XYZAL) 5 MG tablet 5 mg.     . levothyroxine (SYNTHROID, LEVOTHROID) 88 MCG tablet Take 88 mcg by mouth daily before breakfast.    . Melatonin 10 MG TABS Take 50 mg by mouth at bedtime.    . Multiple Vitamin (MULTI-VITAMIN DAILY PO) Take by mouth.    . pantoprazole (PROTONIX) 40 MG tablet 1 po 30 mins prior to first meal 30 tablet 11  . potassium chloride (KLOR-CON) 10 MEQ tablet Take 10 mEq by mouth 2 (two) times daily.    . Probiotic Product (PROBIOTIC-10 PO) Take by mouth.    . QUEtiapine (SEROQUEL) 100 MG tablet TAKE 3 TABLETS (300 MG TOTAL) BY MOUTH AT BEDTIME 90 tablet 3  . vitamin  A 3 MG (10000 UNITS) capsule Take 10,000 Units by mouth daily.    . Zinc Sulfate (ZINC 15 PO) Take by mouth.    . zonisamide (ZONEGRAN) 50 MG capsule Take 3 tablets twice daily 540 capsule 1  . ibuprofen (ADVIL,MOTRIN) 200 MG tablet Take 400-800 mg by mouth every 6 (six) hours as needed for moderate pain.    [name] Kitchen nystatin cream (MYCOSTATIN)      No facility-administered medications prior to visit.    PAST MEDICAL HISTORY: Past Medical History:  Diagnosis Date  . Anxiety   . Chronic insomnia 07/27/2018  . Chronic kidney disease   . Chronic pain   . Common migraine with intractable migraine 07/27/2018  . Essential hypertension   . 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 GERD (gastroesophageal reflux disease)   . History of cardiac catheterization    Normal coronaries November 2016  . History of pneumonia   . Hypothyroidism   . Migraine   . Ovarian cyst   . Pneumonia   . Spinal stenosis   . Takotsubo syndrome    November 2016  . Thrombocytosis ([place]) 01/24/2016    PAST SURGICAL HISTORY: Past Surgical History:  Procedure Laterality Date  . BIOPSY  03/22/2019   Procedure: BIOPSY;  Surgeon: Danie Binder, MD;  Location: [facility];  Service: Endoscopy;;  . CARDIAC CATHETERIZATION N/A 02/23/2015   Procedure: Left Heart Cath and Coronary Angiography;  Surgeon: Jettie Booze, MD;  Location: [facility];  Service: Cardiovascular;  Laterality: N/A;  . CHOLECYSTECTOMY    . DOPPLER ECHOCARDIOGRAPHY    . ESOPHAGOGASTRODUODENOSCOPY (EGD) WITH PROPOFOL N/A 03/22/2019   Procedure: ESOPHAGOGASTRODUODENOSCOPY (EGD) WITH PROPOFOL;  Surgeon: Danie Binder, MD;  Location: [facility];  Service: Endoscopy;  Laterality: N/A;  2:45pm  . GUM SURGERY     Removed cyst  . LEEP    . SAVORY DILATION N/A 03/22/2019   Procedure: SAVORY DILATION;  Surgeon: Danie Binder, MD;  Location: [facility];  Service: Endoscopy;  Laterality: N/A;    FAMILY HISTORY: Family History  Problem Relation Age of  Onset  . Hypertension Father   . Breast cancer Mother        metastatic to brain  . Migraines Sister   . Migraines Brother   . Heart attack Maternal Grandfather   . Seizures Neg Hx   . Colon cancer Neg Hx   . Colon polyps Neg Hx     SOCIAL HISTORY: Social History   Socioeconomic History  . Marital status: Married    Spouse name: Not on file  . Number of children: 1  . Years of education: HS  . Highest education level: Not on  file  Occupational History  . Occupation: N/A  Tobacco Use  . Smoking status: Current Every Day Smoker    Packs/day: 0.75    Years: 30.00    Pack years: 22.50    Types: Cigarettes  . Smokeless tobacco: Never Used  Substance and Sexual Activity  . Alcohol use: Yes    Alcohol/week: 0.0 standard drinks  . Drug use: No  . Sexual activity: Not on file  Other Topics Concern  . Not on file  Social History Narrative   She lives with husband and daughter.  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   Patient does not drink caffeine.   Patient is right handed.    SPENDS FREE TIME: WITH GRAND KIDS, READ([employer]), [employer], [place], SINGS.   Social Determinants of Health   Financial Resource Strain:   . Difficulty of Paying Living Expenses: Not on file  Food Insecurity:   . Worried About Charity fundraiser in the Last Year: Not on file  . Ran Out of Food in the Last Year: Not on file  Transportation Needs:   . Lack of Transportation (Medical): Not on file  . Lack of Transportation (Non-Medical): Not on file  Physical Activity:   . Days of Exercise per Week: Not on file  . Minutes of Exercise per Session: Not on file  Stress:   . Feeling of Stress : Not on file  Social Connections:   . Frequency of Communication with Friends and Family: Not on file  . Frequency of Social Gatherings with Friends and Family: Not on file  . Attends Religious Services: Not on file  . Active Member of Clubs or Organizations: Not on file    . Attends Archivist Meetings: Not on file  . Marital Status: Not on file  Intimate Partner Violence:   . Fear of Current or Ex-Partner: Not on file  . Emotionally Abused: Not on file  . Physically Abused: Not on file  . Sexually Abused: Not on file      PHYSICAL EXAM  Vitals:   06/08/19 1356  BP: (!) 142/86  Pulse: 94  Temp: (!) 96.9 F (36.1 C)  Weight: 164 lb 9.6 oz (74.7 kg)  Height: 5\' 2"  (1.575 m)   Body mass index is 30.11 kg/m.  Generalized: Well developed, in no acute distress   Neurological examination  Mentation: Alert oriented to time, place, history taking. Follows all commands speech and language fluent Cranial nerve II-XII: Pupils were equal round reactive to light. Extraocular movements were full, visual field were full on confrontational test.. Head turning and shoulder shrug  were normal and symmetric. Motor: The motor testing reveals 5 over 5 strength of all 4 extremities. Good symmetric motor tone is noted throughout.  Sensory: Sensory testing is intact to soft touch on all 4 extremities. No evidence of extinction is noted.  Coordination: Cerebellar testing reveals good finger-nose-finger and heel-to-shin bilaterally.  Gait and station: Gait is normal. Tandem gait is normal. Romberg is negative. No drift is seen.  Reflexes: Deep tendon reflexes are symmetric and normal bilaterally.   DIAGNOSTIC DATA (LABS, IMAGING, TESTING) - I reviewed patient records, labs, notes, testing and imaging myself where available.  Lab Results  Component Value Date   WBC 11.7 (H) 03/26/2017   HGB 12.4 03/26/2017   HCT 37.8 03/26/2017   MCV 95.9 03/26/2017   PLT 360 03/26/2017      Component Value Date/Time  NA 126 (L) 03/26/2017 1402   K 4.0 03/26/2017 1402   CL 93 (L) 03/26/2017 1402   CO2 24 03/26/2017 1402   GLUCOSE 111 (H) 03/26/2017 1402   BUN 14 03/26/2017 1402   CREATININE 0.74 03/26/2017 1402   CALCIUM 9.0 03/26/2017 1402   PROT 6.8  02/26/2016 1147   ALBUMIN 3.8 02/26/2016 1147   AST 63 (H) 02/26/2016 1147   ALT 95 (H) 02/26/2016 1147   ALKPHOS 225 (H) 02/26/2016 1147   BILITOT 0.8 02/26/2016 1147   GFRNONAA >60 03/26/2017 1402   GFRAA >60 03/26/2017 1402   Lab Results  Component Value Date   CHOL 208 (H) 02/23/2015   HDL 43 02/23/2015   LDLCALC 146 (H) 02/23/2015   TRIG 95 02/23/2015   CHOLHDL 4.8 02/23/2015   Lab Results  Component Value Date   HGBA1C 5.7 05/04/2013   Lab Results  Component Value Date   VITAMINB12 913 03/26/2017   Lab Results  Component Value Date   TSH 0.639 02/24/2015      ASSESSMENT AND PLAN 64 y.o. year old female  has a past medical history of Anxiety, Chronic insomnia (07/27/2018), Chronic kidney disease, Chronic pain, Common migraine with intractable migraine (07/27/2018), Essential hypertension, Folate deficiency (05/22/2016), GERD (gastroesophageal reflux disease), History of cardiac catheterization, History of pneumonia, Hypothyroidism, Migraine, Ovarian cyst, Pneumonia, Spinal stenosis, Takotsubo syndrome, and Thrombocytosis ([place]) (01/24/2016). here with :  1.  Intractable headaches  -Continue Aimovig  2.  Chronic insomnia  -Continue Seroquel -Proceed with sleep evaluation  3.  Seizures  -Continue Zonegran 150 mg twice a day  4.  Hallucinations  -Most likely due to sleep deprivation should proceed with sleep evaluation   Advised if symptoms worsen or she develops new symptoms she should let us know.  Follow-up in 6 months or sooner if needed   Ward Givens, MSN, NP-C 06/08/2019, 2:07 PM [facility] [address], [place], [place] [address] [phone]

## 2019-06-08 NOTE — Patient Instructions (Signed)
Your Plan:  Continue Aimovig  Continue seroquel for sleep Continue zonegran 150 mg twice a day Continue with sleep eval If your symptoms worsen or you develop new symptoms please let us know.       Thank you for coming to see [place] at [facility]. I hope we have been able to provide you high quality care today.  You may receive a patient satisfaction survey over the next few weeks. We would appreciate your feedback and comments so that we may continue to improve ourselves and the health of our patients.

## 2019-07-04 ENCOUNTER — Encounter: Payer: Self-pay | Admitting: Gastroenterology

## 2019-07-22 ENCOUNTER — Ambulatory Visit: Payer: 59 | Admitting: Gastroenterology

## 2019-07-25 ENCOUNTER — Ambulatory Visit: Payer: 59 | Admitting: Gastroenterology

## 2019-08-01 ENCOUNTER — Ambulatory Visit: Payer: 59 | Admitting: Gastroenterology

## 2019-08-08 ENCOUNTER — Other Ambulatory Visit: Payer: Self-pay | Admitting: Neurology

## 2019-12-01 ENCOUNTER — Other Ambulatory Visit: Payer: Self-pay | Admitting: Adult Health

## 2019-12-01 ENCOUNTER — Other Ambulatory Visit: Payer: Self-pay | Admitting: *Deleted

## 2019-12-01 MED ORDER — QUETIAPINE FUMARATE 100 MG PO TABS: 300.0000 mg | ORAL_TABLET | Freq: Every day | ORAL | 5 refills | Status: DC

## 2019-12-08 ENCOUNTER — Other Ambulatory Visit: Payer: Self-pay | Admitting: Neurology

## 2019-12-14 ENCOUNTER — Ambulatory Visit: Payer: 59 | Admitting: Adult Health

## 2019-12-14 ENCOUNTER — Encounter: Payer: Self-pay | Admitting: Adult Health

## 2019-12-14 VITALS — BP 114/73 | HR 86 | Ht 60.0 in | Wt 165.0 lb

## 2019-12-14 DIAGNOSIS — G43719 Chronic migraine without aura, intractable, without status migrainosus: Secondary | ICD-10-CM | POA: Diagnosis not present

## 2019-12-14 DIAGNOSIS — R569 Unspecified convulsions: Secondary | ICD-10-CM | POA: Diagnosis not present

## 2019-12-14 DIAGNOSIS — F5104 Psychophysiologic insomnia: Secondary | ICD-10-CM | POA: Diagnosis not present

## 2019-12-14 MED ORDER — AJOVY 225 MG/1.5ML ~~LOC~~ SOAJ: 225.0000 mg | SUBCUTANEOUS | 5 refills | Status: DC

## 2019-12-14 NOTE — Patient Instructions (Signed)
Your Plan:  Continue Zonegran Start Ajovy Stop aimovig  Thank you for coming to see [place] at [facility]. I hope we have been able to provide you high quality care today.  You may receive a patient satisfaction survey over the next few weeks. We would appreciate your feedback and comments so that we may continue to improve ourselves and the health of our patients.  Fremanezumab injection What is this medicine? FREMANEZUMAB (fre ma NEZ ue mab) is used to prevent migraine headaches. This medicine may be used for other purposes; ask your health care provider or pharmacist if you have questions. COMMON BRAND NAME(S): AJOVY What should I tell my health care provider before I take this medicine? They need to know if you have any of these conditions:  an unusual or allergic reaction to fremanezumab, other medicines, foods, dyes, or preservatives  pregnant or trying to get pregnant  breast-feeding How should I use this medicine? This medicine is for injection under the skin. You will be taught how to prepare and give this medicine. Use exactly as directed. Take your medicine at regular intervals. Do not take your medicine more often than directed. It is important that you put your used needles and syringes in a special sharps container. Do not put them in a trash can. If you do not have a sharps container, call your pharmacist or healthcare provider to get one. Talk to your pediatrician regarding the use of this medicine in children. Special care may be needed. Overdosage: If you think you have taken too much of this medicine contact a poison control center or emergency room at once. NOTE: This medicine is only for you. Do not share this medicine with others. What if I miss a dose? If you miss a dose, take it as soon as you can. If it is almost time for your next dose, take only that dose. Do not take double or extra doses. What may interact with this medicine? Interactions  are not expected. This list may not describe all possible interactions. Give your health care provider a list of all the medicines, herbs, non-prescription drugs, or dietary supplements you use. Also tell them if you smoke, drink alcohol, or use illegal drugs. Some items may interact with your medicine. What should I watch for while using this medicine? Tell your doctor or healthcare professional if your symptoms do not start to get better or if they get worse. What side effects may I notice from receiving this medicine? Side effects that you should report to your doctor or health care professional as soon as possible:  allergic reactions like skin rash, itching or hives, swelling of the face, lips, or tongue Side effects that usually do not require medical attention (report these to your doctor or health care professional if they continue or are bothersome):  pain, redness, or irritation at site where injected This list may not describe all possible side effects. Call your doctor for medical advice about side effects. You may report side effects to FDA at 1-800-FDA-1088. Where should I keep my medicine? Keep out of the reach of children. You will be instructed on how to store this medicine. Throw away any unused medicine after the expiration date on the label. NOTE: This sheet is a summary. It may not cover all possible information. If you have questions about this medicine, talk to your doctor, pharmacist, or health care provider.  2020 Elsevier/Gold Standard (2017-01-05 17:22:56)

## 2019-12-14 NOTE — Progress Notes (Signed)
PATIENT: Rita Stephens DOB: Jan 26, 1956  REASON FOR VISIT: follow up HISTORY FROM: patient  HISTORY OF PRESENT ILLNESS: Today 12/14/19:  Rita Stephens is a 64 year old female with a history of chronic insomnia, seizures and intractable headaches.  She returns today for follow-up.  Reports that she stopped Aimovig as she felt it was no longer offering her any benefit.  She states that she has approximately 10-12 migraines a month.  She would like to try different medication.  She remains on Zonegran.  Denies any seizure events.  At the last visit she reported that her PCP had referred her for sleep study.  She never had this done.  She continues to have trouble with insomnia.  HISTORY 06/08/19: Rita Stephens is a 64 year old female with a history of chronic insomnia, seizures and intractable headaches.  She reports that her headaches have improved with Aimovig but reports that the frequency of her headaches varies.  She states that she is only had 1 grand mal seizure.  She continues on Zonegran 150 mg twice a day.  She states that she may go 3 to 4 days without sleeping at all.  She states typically after that she will have a period of 4 to 5 days that she feels that she is "out of it."  She states that during this time she may sleep at night but during the day she feels foggy headed.  Reports that there are times that she sees her daughter but then she is gone. ( daughter was never there) she also may see worms coming out of the carpet.  Reports that after several days this tends to subside.  She continues to take Seroquel at bedtime.  She did have an evaluation by Dr. De Nurse who ordered sleep study however the patient has not proceeded with this.  She states that her PCP is ordering a home sleep study.  Returns today for an evaluation.  REVIEW OF SYSTEMS: Out of a complete 14 system review of symptoms, the patient complains only of the following symptoms, and all other reviewed systems are negative.  See  HPI  ALLERGIES: Allergies  Allergen Reactions  . Amitriptyline Itching    Allergy identified by daughter  . Actifed Cold-Allergy [Chlorpheniramine-Phenyleph Er] Other (See Comments)    ALTERS MENTAL STATUS  . Sudafed [Pseudoephedrine Hcl] Other (See Comments)    Climbs walls  . Other Palpitations    Topamax    HOME MEDICATIONS: Outpatient Medications Prior to Visit  Medication Sig Dispense Refill  . ALPRAZolam (XANAX) 1 MG tablet Take 3 mg by mouth at bedtime.     [name] Kitchen aspirin EC 81 MG EC tablet Take 1 tablet (81 mg total) by mouth daily.    [name] Kitchen CINNAMON PO Take by mouth.    [name] Kitchen ibuprofen (ADVIL,MOTRIN) 200 MG tablet Take 400-800 mg by mouth every 6 (six) hours as needed for moderate pain.    [name] Kitchen levothyroxine (SYNTHROID, LEVOTHROID) 88 MCG tablet Take 88 mcg by mouth daily before breakfast.    . Melatonin 10 MG TABS Take 50 mg by mouth at bedtime.    . Multiple Vitamin (MULTI-VITAMIN DAILY PO) Take by mouth.    . Probiotic Product (PROBIOTIC-10 PO) Take by mouth.    . QUEtiapine (SEROQUEL) 100 MG tablet Take 3 tablets (300 mg total) by mouth at bedtime. 90 tablet 5  . vitamin A 3 MG (10000 UNITS) capsule Take 10,000 Units by mouth daily.    . Zinc Sulfate (ZINC 15 PO) Take  by mouth.    . zonisamide (ZONEGRAN) 50 MG capsule TAKE THREE (3) CAPSULES BY MOUTH TWO (2) TIMES DAILY 540 capsule 1  . Erenumab-aooe (AIMOVIG) 140 MG/ML SOAJ Inject 140 mg into the skin every 30 (thirty) days. 1 pen 5  . levocetirizine (XYZAL) 5 MG tablet 5 mg.     . pantoprazole (PROTONIX) 40 MG tablet 1 po 30 mins prior to first meal 30 tablet 11  . potassium chloride (KLOR-CON) 10 MEQ tablet Take 10 mEq by mouth 2 (two) times daily.     No facility-administered medications prior to visit.    PAST MEDICAL HISTORY: Past Medical History:  Diagnosis Date  . Anxiety   . Chronic insomnia 07/27/2018  . Chronic kidney disease   . Chronic pain   . Common migraine with intractable migraine 07/27/2018  . Essential  hypertension   . 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 GERD (gastroesophageal reflux disease)   . History of cardiac catheterization    Normal coronaries November 2016  . History of pneumonia   . Hypothyroidism   . Migraine   . Ovarian cyst   . Pneumonia   . Spinal stenosis   . Takotsubo syndrome    November 2016  . Thrombocytosis ([place]) 01/24/2016    PAST SURGICAL HISTORY: Past Surgical History:  Procedure Laterality Date  . BIOPSY  03/22/2019   Procedure: BIOPSY;  Surgeon: Danie Binder, MD;  [facility];  Service: Endoscopy;;  . CARDIAC CATHETERIZATION N/A 02/23/2015   Procedure: Left Heart Cath and Coronary Angiography;  Surgeon: Jettie Booze, MD;  Location: [facility];  Service: Cardiovascular;  Laterality: N/A;  . CHOLECYSTECTOMY    . DOPPLER ECHOCARDIOGRAPHY    . ESOPHAGOGASTRODUODENOSCOPY (EGD) WITH PROPOFOL N/A 03/22/2019   Procedure: ESOPHAGOGASTRODUODENOSCOPY (EGD) WITH PROPOFOL;  Surgeon: Danie Binder, MD;  [facility];  Service: Endoscopy;  Laterality: N/A;  2:45pm  . GUM SURGERY     Removed cyst  . LEEP    . SAVORY DILATION N/A 03/22/2019   Procedure: SAVORY DILATION;  Surgeon: Danie Binder, MD;  [facility];  Service: Endoscopy;  Laterality: N/A;    FAMILY HISTORY: Family History  Problem Relation Age of Onset  . Hypertension Father   . Breast cancer Mother        metastatic to brain  . Migraines Sister   . Migraines Brother   . Heart attack Maternal Grandfather   . Seizures Neg Hx   . Colon cancer Neg Hx   . Colon polyps Neg Hx     SOCIAL HISTORY: Social History   Socioeconomic History  . Marital status: Married    Spouse name: Not on file  . Number of children: 1  . Years of education: HS  . Highest education level: Not on file  Occupational History  . Occupation: N/A  Tobacco Use  . Smoking status: Current Every Day Smoker    Packs/day: 0.75    Years: 30.00    Pack years: 22.50    Types:  Cigarettes  . Smokeless tobacco: Never Used  Substance and Sexual Activity  . Alcohol use: Yes    Alcohol/week: 0.0 standard drinks  . Drug use: No  . Sexual activity: Not on file  Other Topics Concern  . Not on file  Social History Narrative   She lives with husband and daughter.  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   Patient does not  drink caffeine.   Patient is right handed.    SPENDS FREE TIME: WITH GRAND KIDS, READ([employer]), [place], [place], [place].   Social Determinants of Health   Financial Resource Strain:   . Difficulty of Paying Living Expenses: Not on file  Food Insecurity:   . Worried About Charity fundraiser in the Last Year: Not on file  . Ran Out of Food in the Last Year: Not on file  Transportation Needs:   . Lack of Transportation (Medical): Not on file  . Lack of Transportation (Non-Medical): Not on file  Physical Activity:   . Days of Exercise per Week: Not on file  . Minutes of Exercise per Session: Not on file  Stress:   . Feeling of Stress : Not on file  Social Connections:   . Frequency of Communication with Friends and Family: Not on file  . Frequency of Social Gatherings with Friends and Family: Not on file  . Attends Religious Services: Not on file  . Active Member of Clubs or Organizations: Not on file  . Attends Archivist Meetings: Not on file  . Marital Status: Not on file  Intimate Partner Violence:   . Fear of Current or Ex-Partner: Not on file  . Emotionally Abused: Not on file  . Physically Abused: Not on file  . Sexually Abused: Not on file      PHYSICAL EXAM  Vitals:   12/14/19 1449  BP: 114/73  Pulse: 86  Weight: 165 lb (74.8 kg)  Height: 5' (1.524 m)   Body mass index is 32.22 kg/m.  Generalized: Well developed, in no acute distress   Neurological examination  Mentation: Alert oriented to time, place, history taking. Follows all commands speech and language  fluent Cranial nerve II-XII: Pupils were equal round reactive to light. Extraocular movements were full, visual field were full on confrontational test.  Head turning and shoulder shrug  were normal and symmetric. Motor: The motor testing reveals 5 over 5 strength of all 4 extremities. Good symmetric motor tone is noted throughout.  Sensory: Sensory testing is intact to soft touch on all 4 extremities. No evidence of extinction is noted.  Coordination: Cerebellar testing reveals good finger-nose-finger and heel-to-shin bilaterally.  Gait and station: Gait is normal.  Reflexes: Deep tendon reflexes are symmetric and normal bilaterally.   DIAGNOSTIC DATA (LABS, IMAGING, TESTING) - I reviewed patient records, labs, notes, testing and imaging myself where available.  Lab Results  Component Value Date   WBC 11.7 (H) 03/26/2017   HGB 12.4 03/26/2017   HCT 37.8 03/26/2017   MCV 95.9 03/26/2017   PLT 360 03/26/2017      Component Value Date/Time   NA 126 (L) 03/26/2017 1402   K 4.0 03/26/2017 1402   CL 93 (L) 03/26/2017 1402   CO2 24 03/26/2017 1402   GLUCOSE 111 (H) 03/26/2017 1402   BUN 14 03/26/2017 1402   CREATININE 0.74 03/26/2017 1402   CALCIUM 9.0 03/26/2017 1402   PROT 6.8 02/26/2016 1147   ALBUMIN 3.8 02/26/2016 1147   AST 63 (H) 02/26/2016 1147   ALT 95 (H) 02/26/2016 1147   ALKPHOS 225 (H) 02/26/2016 1147   BILITOT 0.8 02/26/2016 1147   GFRNONAA >60 03/26/2017 1402   GFRAA >60 03/26/2017 1402   Lab Results  Component Value Date   CHOL 208 (H) 02/23/2015   HDL 43 02/23/2015   LDLCALC 146 (H) 02/23/2015   TRIG 95 02/23/2015   CHOLHDL 4.8 02/23/2015  Lab Results  Component Value Date   HGBA1C 5.7 05/04/2013   Lab Results  Component Value Date   MMNOTRRN16 579 03/26/2017   Lab Results  Component Value Date   TSH 0.639 02/24/2015      ASSESSMENT AND PLAN 64 y.o. year old female  has a past medical history of Anxiety, Chronic insomnia (07/27/2018), Chronic  kidney disease, Chronic pain, Common migraine with intractable migraine (07/27/2018), Essential hypertension, Folate deficiency (05/22/2016), GERD (gastroesophageal reflux disease), History of cardiac catheterization, History of pneumonia, Hypothyroidism, Migraine, Ovarian cyst, Pneumonia, Spinal stenosis, Takotsubo syndrome, and Thrombocytosis ([place]) (01/24/2016). here with:  1.  Migraine headaches   Start Ajovy  2.  Seizures   Continue Zonegran 150 mg twice a day  Advised if symptoms worsen or she develops new symptoms she should let us know Follow-up in 6 months or sooner if needed   I spent 25 minutes of face-to-face and non-face-to-face time with patient.  This included previsit chart review, lab review, study review, order entry, electronic health record documentation, patient education.  Ward Givens, MSN, NP-C 12/14/2019, 3:25 PM [facility] [address], [place], [place] [address] [phone]

## 2019-12-15 ENCOUNTER — Telehealth: Payer: Self-pay | Admitting: Adult Health

## 2019-12-15 NOTE — Telephone Encounter (Signed)
Received PA request for Ajovy from [email or web address]. PA was started on [email or web address]. Key is [id number]. Per [email or web address], will receive determination within 1-3 business days.

## 2019-12-15 NOTE — Progress Notes (Signed)
I have read the note, and I agree with the clinical assessment and plan.  Yasemin Rabon K Mckinsey Keagle   

## 2019-12-19 NOTE — Telephone Encounter (Signed)
Received a fax from [employer] stating that the PA for Ajovy has been approved 12/15/19 to 03/16/20. Will fax a copy of the approval letter to her pharmacy.

## 2020-01-27 ENCOUNTER — Telehealth: Payer: Self-pay | Admitting: Adult Health

## 2020-01-27 NOTE — Telephone Encounter (Signed)
Pt called wanting to speak to the RN about sweats that she has been having that are bad to the point that it drips and soaks her hair. Pt states she thinks it is a side affect of her zonisamide (ZONEGRAN) 50 MG capsule. Please advise.

## 2020-01-30 NOTE — Telephone Encounter (Signed)
Could be a side effect. However if its treating her seizures and its manageable then I would not recommend the medication be stopped.

## 2020-01-30 NOTE — Telephone Encounter (Signed)
I called pt and she relayed that she has been on the medication since 2016.  She is not sure if it started then, but she states from the last 6 months or so, when she takes the zonegran she will sweat profusely head, face, neck.  (not underarms (she said may happen at night).  She had a gap last Friday to Wednesday she did not have her medication and when she took Wednesday evening she noted sweating. Please advise.

## 2020-01-30 NOTE — Telephone Encounter (Signed)
I called pt. I had an extended conversation with her. Although she has been on zonegran since 2015 she is adamant that it is causing profuse sweating that his affecting her daily life. She would like to discuss alternatives to zonegran for seizure control. Pt cannot come for appts on Tuesdays. The only other appt at this time is with Janett Billow, NP on 02/01/20 at 2:15pm. She will call her PCP to move an existing an appt to the AM that day but then call us right back to take this appt. Please assist pt with scheduling when she calls back.

## 2020-01-31 NOTE — Telephone Encounter (Signed)
I called pt. She would like to see Dr. Jannifer Franklin tomorrow at 3:30pm and this appt has been scheduled. Pt is aware to check in at 3pm.

## 2020-01-31 NOTE — Telephone Encounter (Signed)
Pt cannot see Janett Billow, NP since she has seen Jinny Blossom, NP in the past. Dr. Jannifer Franklin has an opening on 10/13/2021at 3:30 pm that pt may have.   I called pt to discuss. No answer, left a message asking her to call me back. If pt calls back please assist her with this.

## 2020-01-31 NOTE — Telephone Encounter (Signed)
Pt has called back, she was offered Megan,NP's next avail.  Pt declined.  Pt will see her PCP and call [facility] back.  This is Pharmacist, hospital for [facility]

## 2020-02-01 ENCOUNTER — Ambulatory Visit: Payer: 59 | Admitting: Neurology

## 2020-02-01 ENCOUNTER — Ambulatory Visit: Payer: 59 | Admitting: Adult Health

## 2020-02-01 ENCOUNTER — Encounter: Payer: Self-pay | Admitting: Neurology

## 2020-02-01 VITALS — BP 162/95 | HR 94 | Ht 62.0 in | Wt 160.0 lb

## 2020-02-01 DIAGNOSIS — R569 Unspecified convulsions: Secondary | ICD-10-CM

## 2020-02-01 LAB — LAB REPORT - SCANNED
A1c: 5.6
EGFR (Non-African Amer.): 35
TSH: 0.91 (ref 0.41–5.90)
TSH: 0.96 (ref 0.41–5.90)

## 2020-02-01 MED ORDER — LEVETIRACETAM 500 MG PO TABS: 500.0000 mg | ORAL_TABLET | Freq: Two times a day (BID) | ORAL | 3 refills | Status: DC

## 2020-02-01 NOTE — Patient Instructions (Signed)
We will stop the zonegran and start Keppra 500 mg twice a day.

## 2020-02-01 NOTE — Progress Notes (Signed)
Reason for visit: Seizures  Rita Stephens is an 64 y.o. female  History of present illness:  Rita Stephens is a 64 year old right-handed white female with a history of seizures.  The patient has been on Zonegran since 2016.  She states that over the last several years every time she takes her medication she gets sweats about 30 minutes afterwards.  The patient ran out of her Zonegran a week or so ago, when she was off her medication she had no problems but when she restarted the drug the sweats returned.  She comes in today wishing to come off of the Zonegran and switch to another antiepileptic medication.  She is still having some headaches, she has about 1 headache a week that may last all day, she remains on Ajovy.  Past Medical History:  Diagnosis Date   Anxiety    Chronic insomnia 07/27/2018   Chronic kidney disease    Chronic pain    Common migraine with intractable migraine 07/27/2018   Essential hypertension    Folate deficiency 05/22/2016   GERD (gastroesophageal reflux disease)    History of cardiac catheterization    Normal coronaries November 2016   History of pneumonia    Hypothyroidism    Migraine    Ovarian cyst    Pneumonia    Spinal stenosis    Takotsubo syndrome    November 2016   Thrombocytosis 01/24/2016    Past Surgical History:  Procedure Laterality Date   BIOPSY  03/22/2019   Procedure: BIOPSY;  Surgeon: Danie Binder, MD;  [facility];  Service: Endoscopy;;   CARDIAC CATHETERIZATION N/A 02/23/2015   Procedure: Left Heart Cath and Coronary Angiography;  Surgeon: Jettie Booze, MD;  Location: [place] [facility];  Service: Cardiovascular;  Laterality: N/A;   CHOLECYSTECTOMY     DOPPLER ECHOCARDIOGRAPHY     ESOPHAGOGASTRODUODENOSCOPY (EGD) WITH PROPOFOL N/A 03/22/2019   Procedure: ESOPHAGOGASTRODUODENOSCOPY (EGD) WITH PROPOFOL;  Surgeon: Danie Binder, MD;  [facility];  Service: Endoscopy;  Laterality: N/A;   2:45pm   GUM SURGERY     Removed cyst   LEEP     SAVORY DILATION N/A 03/22/2019   Procedure: SAVORY DILATION;  Surgeon: Danie Binder, MD;  [facility];  Service: Endoscopy;  Laterality: N/A;    Family History  Problem Relation Age of Onset   Hypertension Father    Breast cancer Mother        metastatic to brain   Migraines Sister    Migraines Brother    Heart attack Maternal Grandfather    Seizures Neg Hx    Colon cancer Neg Hx    Colon polyps Neg Hx     Social history:  reports that she has been smoking cigarettes. She has a 22.50 pack-year smoking history. She has never used smokeless tobacco. She reports previous alcohol use. She reports that she does not use drugs.    Allergies  Allergen Reactions   Amitriptyline Itching    Allergy identified by daughter   Actifed Cold-Allergy [Chlorpheniramine-Phenyleph Er] Other (See Comments)    ALTERS MENTAL STATUS   Levothyroxine     Skin peels in between fingers and breaks out into sweats     Prednisone     Can't stay awake   Sudafed [Pseudoephedrine Hcl] Other (See Comments)    Climbs walls   Triptans     Increased confusion and falls   Zonegran [Zonisamide]  Sweats   Other Palpitations    Topamax    Medications:  Prior to Admission medications   Medication Sig Start Date End Date Taking? Authorizing Provider  ALPRAZolam Duanne Moron) 1 MG tablet Take 3 mg by mouth at bedtime.     [provider]  aspirin EC 81 MG EC tablet Take 1 tablet (81 mg total) by mouth daily. 02/25/15   Bhagat, Crista Luria, PA  CINNAMON PO Take by mouth.    [provider]  Fremanezumab-vfrm (AJOVY) 225 MG/1.5ML SOAJ Inject 225 mg into the skin every 30 (thirty) days. 12/14/19   Ward Givens, NP  ibuprofen (ADVIL,MOTRIN) 200 MG tablet Take 400-800 mg by mouth every 6 (six) hours as needed for moderate pain.    [provider]  levothyroxine (SYNTHROID, LEVOTHROID) 88 MCG tablet Take 88 mcg  by mouth daily before breakfast.    [provider]  Melatonin 10 MG TABS Take 50 mg by mouth at bedtime.    [provider]  Multiple Vitamin (MULTI-VITAMIN DAILY PO) Take by mouth.    [provider]  Probiotic Product (PROBIOTIC-10 PO) Take by mouth.    [provider]  QUEtiapine (SEROQUEL) 100 MG tablet Take 3 tablets (300 mg total) by mouth at bedtime. 12/01/19   Ward Givens, NP  vitamin A 3 MG (10000 UNITS) capsule Take 10,000 Units by mouth daily.    [provider]  Zinc Sulfate (ZINC 15 PO) Take by mouth.    [provider]  zonisamide (ZONEGRAN) 50 MG capsule TAKE THREE (3) CAPSULES BY MOUTH TWO (2) TIMES DAILY 12/08/19   Suzzanne Cloud, NP    ROS:  Out of a complete 14 system review of symptoms, the patient complains only of the following symptoms, and all other reviewed systems are negative.  Sweats Headache  Blood pressure (!) 162/95, pulse 94, height 5\' 2"  (1.575 m), weight 160 lb (72.6 kg).  Physical Exam  General: The patient is alert and cooperative at the time of the examination. The patient is moderately obese.  Skin: No significant peripheral edema is noted.   Neurologic Exam  Mental status: The patient is alert and oriented x 3 at the time of the examination. The patient has apparent normal recent and remote memory, with an apparently normal attention span and concentration ability.   Cranial nerves: Facial symmetry is present. Speech is normal, no aphasia or dysarthria is noted. Extraocular movements are full. Visual fields are full.  Motor: The patient has good strength in all 4 extremities.  Sensory examination: Soft touch sensation is symmetric on the face, arms, and legs.  Coordination: The patient has good finger-nose-finger and heel-to-shin bilaterally.  Gait and station: The patient has a normal gait. Tandem gait is slightly unsteady. Romberg is negative. No drift is seen.  Reflexes: Deep  tendon reflexes are symmetric.   Assessment/Plan:  1.  History of seizures  2.  Intractable headache  The patient is having some side effects on the Zonegran, she wishes to switch to another medication.  She will stop the Zonegran and begin Keppra 500 mg twice daily, she will call for any dose adjustments or for any side effects on this medication.  She will follow-up for next scheduled appointment.  Jill Alexanders MD 02/01/2020 3:41 PM  [facility] [address] [place], [place] [address]  Phone [phone] Fax [phone]

## 2020-03-19 ENCOUNTER — Telehealth: Payer: Self-pay | Admitting: Adult Health

## 2020-03-19 NOTE — Telephone Encounter (Signed)
Received a PA renewal for Ajovy. PA was started on [email or web address]. Key is [id number]. Per [email or web address], a determination should be received within the next 24 hours. Will check back later for a response.

## 2020-03-20 NOTE — Telephone Encounter (Signed)
Rita Stephens has been approved 03/19/20 to 03/19/21. Determination has been faxed to patient's pharmacy.

## 2020-04-05 ENCOUNTER — Telehealth: Payer: Self-pay | Admitting: Neurology

## 2020-04-05 DIAGNOSIS — R404 Transient alteration of awareness: Secondary | ICD-10-CM

## 2020-04-05 NOTE — Telephone Encounter (Signed)
Pt called, Saturday night woke up disoriented, brain fog. Yesterday I was able to figure things out. Not sure if I had a seizure. Would like a call from the nurse. Contact: [phone]

## 2020-04-05 NOTE — Telephone Encounter (Signed)
Returned call to patient for more information.  Patient stated this was her 3rd or 4th time waking up like this.  She said she woke up Sunday and it was still Saturday for her, until yesterday, she was a day behind.  She feels like she is having 'brain fogs'.  She always thought it was her nerves.  Patient denies any nausea with episodes.  She stated she is having the same headaches she always has had, thinks she has had a couple more headaches this month than normal.  Doesn't think she had any headaches with previous episodes.    Let patient know that Dr. Jannifer Franklin is out of office til next week and wants to wait until he returns and talk to him.

## 2020-04-05 NOTE — Telephone Encounter (Signed)
Pt.'s daughter Caryl Pina is NOT ON DPR. She was calling to speak with RN about mom's seizure she had over the weekend. She states mom believed it was a seizure, but she thinks it is something else & wants clarification. Please advise.

## 2020-04-06 NOTE — Telephone Encounter (Signed)
I called the patient, I reached the daughter.  The daughter indicates that patient has had episodes of feeling confused in the morning, she is also concerned that she is having some episodes where she may have some slight facial droop or drooling.  Last MRI of the brain was done in 2016 showed a mild to moderate white matter change, the daughter is concerned that she may be having mini strokes.  The patient recently was taken off of Zonegran was switched to [place] in mid October 2021.  Patient was not tolerating Zonegran.  It is possible the patient may be having seizures at night, I suspect that we should check another MRI of the brain and increase the Keppra dosing.  I called the patient, but I had to leave a message, I will call back again.

## 2020-04-06 NOTE — Telephone Encounter (Signed)
I called the patient, left a message, I will call back later. 

## 2020-04-08 NOTE — Telephone Encounter (Signed)
I called the patient, left message, I will call back later. 

## 2020-04-08 NOTE — Telephone Encounter (Signed)
I called the patient again, I left a message again. If the patient has ongoing concerns, she is to call our office next week and leave a number where she can be reached.

## 2020-04-10 NOTE — Telephone Encounter (Signed)
Pt returned call and would like to be called back on [phone]

## 2020-04-10 NOTE — Telephone Encounter (Signed)
I called the patient again, left another message, I will call back tomorrow.

## 2020-04-11 ENCOUNTER — Telehealth: Payer: Self-pay | Admitting: Neurology

## 2020-04-11 NOTE — Telephone Encounter (Signed)
I called the patient again and finally reached her. She indicates that she has had episodes lasting up to 4 or 5 days where she feels out of it and confused. She has had 2 recent episodes over the last 2 weeks. She indicates that these episodes have been going on for 5 or 6 years. Usually, she will get a headache associated with his events, and she has attributed them to migraine. She is on Ajovy. She recent was taken off of Zonegran which may have some effect in controlling migraine and the episodes have increased in frequency. She tends to wake up with the confusion. More recently, she has not been getting headache.  The patient was sent for MRI of the brain.  I discussed the possibility of adding propranolol to her regimen. She is hesitant to start the medication now, we may discuss this later if the episodes continue.

## 2020-04-11 NOTE — Telephone Encounter (Signed)
LVM for pt to call back i need her insurance information

## 2020-04-11 NOTE — Addendum Note (Signed)
Addended by: Kathrynn Ducking on: 04/11/2020 07:36 AM   Modules accepted: Orders

## 2020-04-24 NOTE — Telephone Encounter (Signed)
LVM for pt to call back about scheduling mri  [employer] auth: [employer] spoke to [place] Ref # [id number]

## 2020-05-14 ENCOUNTER — Telehealth: Payer: Self-pay | Admitting: Neurology

## 2020-05-14 NOTE — Telephone Encounter (Signed)
Patient MRI is scheduled at [facility] for 05/15/20.

## 2020-05-15 ENCOUNTER — Ambulatory Visit: Payer: 59

## 2020-05-15 DIAGNOSIS — R404 Transient alteration of awareness: Secondary | ICD-10-CM | POA: Diagnosis not present

## 2020-05-17 ENCOUNTER — Telehealth: Payer: Self-pay | Admitting: Neurology

## 2020-05-17 DIAGNOSIS — R410 Disorientation, unspecified: Secondary | ICD-10-CM

## 2020-05-17 NOTE — Telephone Encounter (Signed)
MRI of the brain showed no change from 2016, agree she has mild to moderate small vessel changes.  If she is still having ongoing episodes frequently of feeling out of it with headache, we may need to make some medication adjustments or recheck EEG study.  She is to call if she is still having a lot of issues.    MRI brain 05/17/20:  IMPRESSION:   MRI brain (without) demonstrating: - Stable, mild to moderate periventricular subcortical chronic small vessel ischemic disease. - No acute findings.

## 2020-05-21 NOTE — Telephone Encounter (Signed)
Called patient and went over Dr. Jannifer Franklin results from MRI.  Patiet made aware of appointment with Jinny Blossom, NP on 06/18/20 @ 1400, aware to arrive at 1330.  Patient expressed appreciation.

## 2020-05-21 NOTE — Telephone Encounter (Signed)
Pt. called for MRI results. Please call pt. back.

## 2020-05-22 NOTE — Telephone Encounter (Signed)
Pt. called back & cancelled appt. with NP because she stated she'd rather schedule with Dr. Jannifer Franklin. She has an appt. in June & is added to the waiting list. She asked for Dr. Jannifer Franklin to be notified that she did call back.

## 2020-05-23 NOTE — Telephone Encounter (Signed)
Pt is asking for a call from Dr Jannifer Franklin to discuss her seizure activity and how she really doesn't want to wait until June to see him.  Pt declined appointment with NP but did agree to being placed on wait list.

## 2020-05-24 MED ORDER — LEVETIRACETAM 500 MG PO TABS: ORAL_TABLET | ORAL | 3 refills | Status: DC

## 2020-05-24 NOTE — Telephone Encounter (Signed)
Pt called, following up requesting a call from Dr. Jannifer Franklin to discuss seizure activity. Would like a call back.

## 2020-05-24 NOTE — Telephone Encounter (Signed)
I called the patient.  The patient continues to have episodes of increased confusion, some amnesia, and headache.  The episodes may represent migraine as the patient was taken off of Zonegran switched to [place].  We will increase the Keppra dose to 500 mg the morning and 1000 mg in the evening.  I will recheck an EEG study.

## 2020-05-24 NOTE — Addendum Note (Signed)
Addended by: Kathrynn Ducking on: 05/24/2020 12:48 PM   Modules accepted: Orders

## 2020-05-25 ENCOUNTER — Other Ambulatory Visit: Payer: Self-pay | Admitting: Adult Health

## 2020-06-06 ENCOUNTER — Other Ambulatory Visit: Payer: 59

## 2020-06-13 ENCOUNTER — Ambulatory Visit: Payer: 59

## 2020-06-13 DIAGNOSIS — R410 Disorientation, unspecified: Secondary | ICD-10-CM | POA: Diagnosis not present

## 2020-06-16 ENCOUNTER — Telehealth: Payer: Self-pay | Admitting: Neurology

## 2020-06-16 NOTE — Telephone Encounter (Signed)
I called the patient.  The EEG study was unremarkable.  If the patient is still having events of headache and confusion, may consider addition of propranolol for potential migraine.

## 2020-06-16 NOTE — Procedures (Signed)
    History:  Rita Stephens is a 65 year old patient with a history of seizures.  The patient recently was taken off of Zonegran and switched to [place] because of side effects due to [place].  The patient began having frequent episodes of confusion, headache, and amnesia.  She is being evaluated for possible seizure events.  This is a routine EEG.  No skull defects are noted.  Medications include alprazolam, aspirin, Ajovy, Synthroid, multivitamins, Seroquel, zinc, and Keppra.  EEG classification: Normal awake  Description of the recording: The background rhythms of this recording consists of a fairly well modulated medium amplitude alpha rhythm of 9 Hz that is reactive to eye opening and closure. As the record progresses, the patient appears to remain in the waking state throughout the recording. Photic stimulation was performed, resulting in a bilateral and symmetric photic driving response. Hyperventilation was also performed, resulting in a minimal buildup of the background rhythm activities without significant slowing seen. At no time during the recording does there appear to be evidence of spike or spike wave discharges or evidence of focal slowing. EKG monitor shows no evidence of cardiac rhythm abnormalities with a heart rate of 78.  Impression: This is a normal EEG recording in the waking state. No evidence of ictal or interictal discharges are seen.

## 2020-06-18 ENCOUNTER — Ambulatory Visit: Payer: 59 | Admitting: Adult Health

## 2020-06-18 NOTE — Telephone Encounter (Signed)
I called the patient.  The patient indicates that she has had a 20-year history of problems with insomnia, she will go 3-4 nights without sleeping, when this occurs, she will get her headaches.  If she was able to sleep, she does not have headaches.  She will have 1 or 2 headaches a week.  She has been on a multitude of medications for sleeping including Restoril which she is on now, she has been on Belsomra, she has taken trazodone, she is on high-dose Seroquel at night, she has taken melatonin.  Nothing has helped her sleeping problems over the years.  I am not clear that a sleep study would help in improving the treatment for this issue, sleep studies are not indicated for chronic insomnia.  The treatment for her headache therefore is to treat the sleep issues, she may benefit from biofeedback or other psychological treatments rather than medications.

## 2020-06-18 NOTE — Telephone Encounter (Signed)
Pt. is requesting a call from Dr. regarding EEG results. Please advise.

## 2020-06-18 NOTE — Telephone Encounter (Signed)
Called patient and discussed EEG results.  Patient stated since her medication was adjusted, she hasn't had any new seizures since December.    She is requesting the the propranolol to be prescribed for headaches.    She is also requesting to have a sleep study.  She believes her poor sleep is contributing to the seizures.  Patient denied further questions, verbalized understanding and expressed appreciation for the phone call.

## 2020-07-20 LAB — HM PAP SMEAR

## 2020-07-25 DIAGNOSIS — Z0271 Encounter for disability determination: Secondary | ICD-10-CM

## 2020-08-20 ENCOUNTER — Telehealth: Payer: Self-pay | Admitting: Neurology

## 2020-08-20 MED ORDER — LEVETIRACETAM 500 MG PO TABS: ORAL_TABLET | ORAL | 3 refills | Status: DC

## 2020-08-20 NOTE — Telephone Encounter (Signed)
I called the patient.  I left a message.  We had started Keppra a while back, and actually increase the dose on the medication, it appears that the patient may have never started the medication begin with, I will go ahead and send in the prescription for the Keppra, I was under the assumption that she was already on the medication.

## 2020-08-20 NOTE — Telephone Encounter (Signed)
Pt called stating that in her last visit she was offered to try the levETIRAcetam (KEPPRA) 500 MG tablet but at the time she did not think she needed it but now she states that the headaches have gotten worse and she would like to know if the medication can be called in to [facility]. Please advise.

## 2020-08-21 MED ORDER — DIVALPROEX SODIUM 250 MG PO DR TAB: DELAYED_RELEASE_TABLET | ORAL | 2 refills | Status: DC

## 2020-08-21 NOTE — Telephone Encounter (Signed)
Pt called, to discuss my prescription for my Keppra and could you prescribe something for my migraine. Would like a call from the nurse.

## 2020-08-21 NOTE — Addendum Note (Signed)
Addended by: Kathrynn Ducking on: 08/21/2020 03:59 PM   Modules accepted: Orders

## 2020-08-21 NOTE — Telephone Encounter (Signed)
I called the patient.  The patient indicates that Ajovy is no longer effective for her headaches, she stopped the medication.  She is on Keppra.  I will try Depakote for her headaches, will start on 250 mg twice daily for 1 week and then go to 500 mg twice daily.

## 2020-10-04 ENCOUNTER — Telehealth: Payer: Self-pay | Admitting: Neurology

## 2020-10-04 NOTE — Telephone Encounter (Signed)
Pt is asking for a call to discuss needing to r/s.  Since Dr Jannifer Franklin isn't showing to have any openings she'd like to know if she will f/u with NP or what other Dr, please call.

## 2020-10-04 NOTE — Telephone Encounter (Signed)
Pt scheduled for 7/21 at 10:15 with Sarah.

## 2020-10-08 ENCOUNTER — Ambulatory Visit: Payer: 59 | Admitting: Neurology

## 2020-11-08 ENCOUNTER — Ambulatory Visit: Payer: 59 | Admitting: Neurology

## 2020-11-15 ENCOUNTER — Other Ambulatory Visit: Payer: Self-pay | Admitting: Adult Health

## 2020-11-26 ENCOUNTER — Ambulatory Visit: Payer: 59 | Admitting: Adult Health

## 2020-12-05 ENCOUNTER — Other Ambulatory Visit: Payer: Self-pay | Admitting: Neurology

## 2021-01-11 ENCOUNTER — Other Ambulatory Visit: Payer: Self-pay | Admitting: Neurology

## 2021-04-10 ENCOUNTER — Encounter: Payer: Self-pay | Admitting: Adult Health

## 2021-04-10 ENCOUNTER — Ambulatory Visit: Payer: 59 | Admitting: Adult Health

## 2021-04-10 VITALS — BP 138/91 | HR 86 | Ht 60.0 in | Wt 173.4 lb

## 2021-04-10 DIAGNOSIS — F5104 Psychophysiologic insomnia: Secondary | ICD-10-CM | POA: Diagnosis not present

## 2021-04-10 DIAGNOSIS — R569 Unspecified convulsions: Secondary | ICD-10-CM

## 2021-04-10 NOTE — Patient Instructions (Signed)
Your Plan:  Continue Keppra 500 mg 1 tablet in the AM and 2 tablets at bedtime If your symptoms worsen or you develop new symptoms please let us know.   Thank you for coming to see [place] at [facility]. I hope we have been able to provide you high quality care today.  You may receive a patient satisfaction survey over the next few weeks. We would appreciate your feedback and comments so that we may continue to improve ourselves and the health of our patients.

## 2021-04-10 NOTE — Progress Notes (Signed)
PATIENT: Rita Stephens DOB: September 30, 1955  REASON FOR VISIT: follow up HISTORY FROM: patient PRIMARY NEUROLOGIST:   HISTORY OF PRESENT ILLNESS: Today 04/10/21:  Rita Stephens Is a 65 year old female with a history of seizures. She returns today for follow-up. Continue Keppra 500 mg and 1000 mg at bedtime. Denies any seizure like event. Reports that the last seizure was at the beginning of the year. Continue seroquel 300 mg at bedtime for insomnia. She struggles with insomnia despite medication. Returns today for follow-up.   HISTORY Rita Stephens is a 65 year old right-handed white female with a history of seizures.  The patient has been on Zonegran since 2016.  She states that over the last several years every time she takes her medication she gets sweats about 30 minutes afterwards.  The patient ran out of her Zonegran a week or so ago, when she was off her medication she had no problems but when she restarted the drug the sweats returned.  She comes in today wishing to come off of the Zonegran and switch to another antiepileptic medication.  She is still having some headaches, she has about 1 headache a week that may last all day, she remains on Ajovy.  REVIEW OF SYSTEMS: Out of a complete 14 system review of symptoms, the patient complains only of the following symptoms, and all other reviewed systems are negative.  See HPI  ALLERGIES: Allergies  Allergen Reactions   Amitriptyline Itching    Allergy identified by daughter   Actifed Cold-Allergy [Chlorpheniramine-Phenyleph Er] Other (See Comments)    ALTERS MENTAL STATUS   Levothyroxine     Skin peels in between fingers and breaks out into sweats     Prednisone     Can't stay awake   Sudafed [Pseudoephedrine Hcl] Other (See Comments)    Climbs walls   Triptans     Increased confusion and falls   Zonegran [Zonisamide]     Sweats   Other Palpitations    Topamax    HOME MEDICATIONS: Outpatient Medications Prior to Visit  Medication  Sig Dispense Refill   ALPRAZolam (XANAX) 1 MG tablet Take 3 mg by mouth at bedtime.     aspirin EC 81 MG EC tablet Take 1 tablet (81 mg total) by mouth daily.     CINNAMON PO Take by mouth.     divalproex (DEPAKOTE) 250 MG DR tablet TWO TABLET TWICE DAILY 120 tablet 2   ibuprofen (ADVIL,MOTRIN) 200 MG tablet Take 400-800 mg by mouth every 6 (six) hours as needed for moderate pain.     levETIRAcetam (KEPPRA) 500 MG tablet TAKE 1 TABLET BY MOUTH IN THE MORNING AND 2 TABLETS BY MOUTH IN THE EVENING. 90 tablet 3   levothyroxine (SYNTHROID, LEVOTHROID) 88 MCG tablet Take 112 mcg by mouth daily before breakfast.     Melatonin 10 MG TABS Take 50 mg by mouth at bedtime.     Multiple Vitamin (MULTI-VITAMIN DAILY PO) Take by mouth.     Probiotic Product (PROBIOTIC-10 PO) Take by mouth.     QUEtiapine (SEROQUEL) 100 MG tablet TAKE 3 TABLETS BY MOUTH AT BEDTIME 90 tablet 5   vitamin A 3 MG (10000 UNITS) capsule Take 10,000 Units by mouth daily.     Zinc Sulfate (ZINC 15 PO) Take by mouth.     No facility-administered medications prior to visit.    PAST MEDICAL HISTORY: Past Medical History:  Diagnosis Date   Anxiety    Chronic insomnia 07/27/2018  Chronic kidney disease    Chronic pain    Common migraine with intractable migraine 07/27/2018   Essential hypertension    Folate deficiency 05/22/2016   GERD (gastroesophageal reflux disease)    History of cardiac catheterization    Normal coronaries November 2016   History of pneumonia    Hypothyroidism    Migraine    Ovarian cyst    Pneumonia    Spinal stenosis    Takotsubo syndrome    November 2016   Thrombocytosis 01/24/2016    PAST SURGICAL HISTORY: Past Surgical History:  Procedure Laterality Date   BIOPSY  03/22/2019   Procedure: BIOPSY;  Surgeon: Danie Binder, MD;  Location: [facility];  Service: Endoscopy;;   CARDIAC CATHETERIZATION N/A 02/23/2015   Procedure: Left Heart Cath and Coronary Angiography;  Surgeon: Jettie Booze, MD;  Location: [facility];  Service: Cardiovascular;  Laterality: N/A;   CHOLECYSTECTOMY     DOPPLER ECHOCARDIOGRAPHY     ESOPHAGOGASTRODUODENOSCOPY (EGD) WITH PROPOFOL N/A 03/22/2019   Procedure: ESOPHAGOGASTRODUODENOSCOPY (EGD) WITH PROPOFOL;  Surgeon: Danie Binder, MD;  Location: [facility];  Service: Endoscopy;  Laterality: N/A;  2:45pm   GUM SURGERY     Removed cyst   LEEP     SAVORY DILATION N/A 03/22/2019   Procedure: SAVORY DILATION;  Surgeon: Danie Binder, MD;  Location: [facility];  Service: Endoscopy;  Laterality: N/A;    FAMILY HISTORY: Family History  Problem Relation Age of Onset   Hypertension Father    Breast cancer Mother        metastatic to brain   Migraines Sister    Migraines Brother    Heart attack Maternal Grandfather    Seizures Neg Hx    Colon cancer Neg Hx    Colon polyps Neg Hx     SOCIAL HISTORY: Social History   Socioeconomic History   Marital status: Married    Spouse name: Richard   Number of children: 1   Years of education: HS   Highest education level: Not on file  Occupational History   Occupation: retired  Tobacco Use   Smoking status: Every Day    Packs/day: 1.00    Years: 30.00    Pack years: 30.00    Types: Cigarettes   Smokeless tobacco: Never  Substance and Sexual Activity   Alcohol use: Not Currently    Alcohol/week: 0.0 standard drinks   Drug use: No   Sexual activity: Not on file  Other Topics Concern   Not on file  Social History Narrative   She lives with husband and daughter.  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   Patient does not drink caffeine.   Patient is right handed.    SPENDS FREE TIME: WITH GRAND KIDS, READ([employer]), [place], [place], S[place].   Social Determinants of Health   Financial Resource Strain: Not on file  Food Insecurity: Not on file  Transportation Needs: Not on file  Physical Activity: Not on file  Stress: Not on file   Social Connections: Not on file  Intimate Partner Violence: Not on file      PHYSICAL EXAM  Vitals:   04/10/21 1432  BP: (!) 138/91  Pulse: 86  Weight: 173 lb 6.4 oz (78.7 kg)  Height: 5' (1.524 m)   Body mass index is 33.86 kg/m.  Generalized: Well developed, in no acute distress   Neurological examination  Mentation: Alert  oriented to time, place, history taking. Follows all commands speech and language fluent Cranial nerve II-XII: Pupils were equal round reactive to light. Extraocular movements were full, visual field were full on confrontational test. Facial sensation and strength were normal. Uvula tongue midline. Head turning and shoulder shrug  were normal and symmetric. Motor: The motor testing reveals 5 over 5 strength of all 4 extremities. Good symmetric motor tone is noted throughout.  Sensory: Sensory testing is intact to soft touch on all 4 extremities. No evidence of extinction is noted.  Coordination: Cerebellar testing reveals good finger-nose-finger and heel-to-shin bilaterally.  Gait and station: Gait is normal.  Reflexes: Deep tendon reflexes are symmetric and normal bilaterally.   DIAGNOSTIC DATA (LABS, IMAGING, TESTING) - I reviewed patient records, labs, notes, testing and imaging myself where available.  Lab Results  Component Value Date   WBC 11.7 (H) 03/26/2017   HGB 12.4 03/26/2017   HCT 37.8 03/26/2017   MCV 95.9 03/26/2017   PLT 360 03/26/2017      Component Value Date/Time   NA 126 (L) 03/26/2017 1402   K 4.0 03/26/2017 1402   CL 93 (L) 03/26/2017 1402   CO2 24 03/26/2017 1402   GLUCOSE 111 (H) 03/26/2017 1402   BUN 14 03/26/2017 1402   CREATININE 0.74 03/26/2017 1402   CALCIUM 9.0 03/26/2017 1402   PROT 6.8 02/26/2016 1147   ALBUMIN 3.8 02/26/2016 1147   AST 63 (H) 02/26/2016 1147   ALT 95 (H) 02/26/2016 1147   ALKPHOS 225 (H) 02/26/2016 1147   BILITOT 0.8 02/26/2016 1147   GFRNONAA >60 03/26/2017 1402   GFRAA >60 03/26/2017  1402   Lab Results  Component Value Date   CHOL 208 (H) 02/23/2015   HDL 43 02/23/2015   LDLCALC 146 (H) 02/23/2015   TRIG 95 02/23/2015   CHOLHDL 4.8 02/23/2015   Lab Results  Component Value Date   HGBA1C 5.7 05/04/2013   Lab Results  Component Value Date   VITAMINB12 913 03/26/2017   Lab Results  Component Value Date   TSH 0.639 02/24/2015      ASSESSMENT AND PLAN 65 y.o. year old female  has a past medical history of Anxiety, Chronic insomnia (07/27/2018), Chronic kidney disease, Chronic pain, Common migraine with intractable migraine (07/27/2018), Essential hypertension, Folate deficiency (05/22/2016), GERD (gastroesophageal reflux disease), History of cardiac catheterization, History of pneumonia, Hypothyroidism, Migraine, Ovarian cyst, Pneumonia, Spinal stenosis, Takotsubo syndrome, and Thrombocytosis (01/24/2016). here with:  Seizures  Continue Keppra 500 mg in the AM and 1000 mg in the PM  Insmonia  Continue Seroquel 300 mg at bedtime  FU in 1 year or sooner of needed.      Ward Givens, MSN, NP-C 04/10/2021, 2:50 PM [facility] [address] [place], [place] [address] [phone]

## 2021-05-16 ENCOUNTER — Other Ambulatory Visit: Payer: Self-pay | Admitting: Adult Health

## 2021-05-20 ENCOUNTER — Other Ambulatory Visit: Payer: Self-pay | Admitting: *Deleted

## 2021-05-20 MED ORDER — LEVETIRACETAM 500 MG PO TABS: ORAL_TABLET | ORAL | 5 refills | Status: DC

## 2021-11-13 ENCOUNTER — Other Ambulatory Visit: Payer: Self-pay | Admitting: Adult Health

## 2022-02-12 DIAGNOSIS — E6609 Other obesity due to excess calories: Secondary | ICD-10-CM | POA: Diagnosis not present

## 2022-02-12 DIAGNOSIS — E063 Autoimmune thyroiditis: Secondary | ICD-10-CM | POA: Diagnosis not present

## 2022-02-12 DIAGNOSIS — Z6827 Body mass index (BMI) 27.0-27.9, adult: Secondary | ICD-10-CM | POA: Diagnosis not present

## 2022-02-12 DIAGNOSIS — M1991 Primary osteoarthritis, unspecified site: Secondary | ICD-10-CM | POA: Diagnosis not present

## 2022-02-12 DIAGNOSIS — K219 Gastro-esophageal reflux disease without esophagitis: Secondary | ICD-10-CM | POA: Diagnosis not present

## 2022-02-12 DIAGNOSIS — I10 Essential (primary) hypertension: Secondary | ICD-10-CM | POA: Diagnosis not present

## 2022-02-12 DIAGNOSIS — F5101 Primary insomnia: Secondary | ICD-10-CM | POA: Diagnosis not present

## 2022-02-12 DIAGNOSIS — E663 Overweight: Secondary | ICD-10-CM | POA: Diagnosis not present

## 2022-02-12 DIAGNOSIS — S0990XA Unspecified injury of head, initial encounter: Secondary | ICD-10-CM | POA: Diagnosis not present

## 2022-02-12 DIAGNOSIS — F419 Anxiety disorder, unspecified: Secondary | ICD-10-CM | POA: Diagnosis not present

## 2022-02-13 LAB — LAB REPORT - SCANNED: EGFR: 83

## 2022-04-10 ENCOUNTER — Ambulatory Visit: Payer: Self-pay | Admitting: Adult Health

## 2022-05-08 ENCOUNTER — Telehealth: Payer: Self-pay | Admitting: Adult Health

## 2022-05-08 ENCOUNTER — Encounter: Payer: Self-pay | Admitting: Adult Health

## 2022-05-08 ENCOUNTER — Ambulatory Visit (INDEPENDENT_AMBULATORY_CARE_PROVIDER_SITE_OTHER): Payer: PPO | Admitting: Adult Health

## 2022-05-08 VITALS — BP 140/77 | HR 80 | Ht 60.0 in | Wt 157.2 lb

## 2022-05-08 DIAGNOSIS — F5104 Psychophysiologic insomnia: Secondary | ICD-10-CM

## 2022-05-08 DIAGNOSIS — R569 Unspecified convulsions: Secondary | ICD-10-CM | POA: Diagnosis not present

## 2022-05-08 NOTE — Progress Notes (Signed)
PATIENT: Rita Stephens DOB: 1956/03/10  REASON FOR VISIT: follow up HISTORY FROM: patient PRIMARY NEUROLOGIST:   Chief Complaint  Patient presents with   Follow-up    Pt in 18 Pt here for seizure/insomnia f/u  Pt states she sleep walks. Pt states still has problems sleeping. Pt states under a lot of stress      HISTORY OF PRESENT ILLNESS: Today 05/08/22:  Rita Stephens is a 67 y.o. female with a history of seizures and chronic insomnia. Returns today for follow-up.  She reports that she has not had any seizure events.  Remains on Keppra 500 mg in the morning and at 1000 mg at bedtime.  She reports that she continues to struggle with insomnia.  Only gets about 4 hours of sleep each night.  She states that she now has security cameras in her home and has found that she has been sleepwalking.  This happens several times a week.  On average she only gets about 4 hours of sleep each night.  She does report that she has been told that she snores.  She reports has daytime sleepiness and she does not sleep well at night.  In the past she was referred for sleep evaluation but never followed through with a sleep study.  Last sleep study was in 2014.  Has never seen psychiatry for chronic insomnia.  No longer taking Seroquel    04/10/21: Rita Stephens Is a 67 year old female with a history of seizures. She returns today for follow-up. Continue Keppra 500 mg and 1000 mg at bedtime. Denies any seizure like event. Reports that the last seizure was at the beginning of the year. Continue seroquel 300 mg at bedtime for insomnia. She struggles with insomnia despite medication. Returns today for follow-up.   HISTORY Rita Stephens is a 24 year old right-handed white female with a history of seizures.  The patient has been on Zonegran since 2016.  She states that over the last several years every time she takes her medication she gets sweats about 30 minutes afterwards.  The patient ran out of her Zonegran a week or so  ago, when she was off her medication she had no problems but when she restarted the drug the sweats returned.  She comes in today wishing to come off of the Zonegran and switch to another antiepileptic medication.  She is still having some headaches, she has about 1 headache a week that may last all day, she remains on Ajovy.  REVIEW OF SYSTEMS: Out of a complete 14 system review of symptoms, the patient complains only of the following symptoms, and all other reviewed systems are negative.  See HPI  ALLERGIES: Allergies  Allergen Reactions   Amitriptyline Itching    Allergy identified by daughter   Actifed Cold-Allergy [Chlorpheniramine-Phenyleph Er] Other (See Comments)    ALTERS MENTAL STATUS   Levothyroxine     Skin peels in between fingers and breaks out into sweats     Prednisone     Can't stay awake   Sudafed [Pseudoephedrine Hcl] Other (See Comments)    Climbs walls   Triptans     Increased confusion and falls   Zonegran [Zonisamide]     Sweats   Other Palpitations    Topamax    HOME MEDICATIONS: Outpatient Medications Prior to Visit  Medication Sig Dispense Refill   ALPRAZolam (XANAX) 1 MG tablet Take 3 mg by mouth at bedtime.     aspirin EC 81 MG EC  tablet Take 1 tablet (81 mg total) by mouth daily.     ibuprofen (ADVIL,MOTRIN) 200 MG tablet Take 400-800 mg by mouth every 6 (six) hours as needed for moderate pain.     levETIRAcetam (KEPPRA) 500 MG tablet TAKE ONE TABLET BY MOUTH IN THE MORNING AND TWO TABLETS IN THE EVENING 90 tablet 5   levothyroxine (SYNTHROID, LEVOTHROID) 88 MCG tablet Take 112 mcg by mouth daily before breakfast.     CINNAMON PO Take by mouth.     Melatonin 10 MG TABS Take 50 mg by mouth at bedtime. (Patient not taking: Reported on 05/08/2022)     Multiple Vitamin (MULTI-VITAMIN DAILY PO) Take by mouth. (Patient not taking: Reported on 05/08/2022)     Probiotic Product (PROBIOTIC-10 PO) Take by mouth. (Patient not taking: Reported on 05/08/2022)      QUEtiapine (SEROQUEL) 100 MG tablet TAKE 3 TABLETS BY MOUTH AT BEDTIME (Patient not taking: Reported on 05/08/2022) 90 tablet 5   vitamin A 3 MG (10000 UNITS) capsule Take 10,000 Units by mouth daily. (Patient not taking: Reported on 05/08/2022)     Zinc Sulfate (ZINC 15 PO) Take by mouth. (Patient not taking: Reported on 05/08/2022)     No facility-administered medications prior to visit.    PAST MEDICAL HISTORY: Past Medical History:  Diagnosis Date   Anxiety    Chronic insomnia 07/27/2018   Chronic kidney disease    Chronic pain    Common migraine with intractable migraine 07/27/2018   Essential hypertension    Folate deficiency 05/22/2016   GERD (gastroesophageal reflux disease)    History of cardiac catheterization    Normal coronaries November 2016   History of pneumonia    Hypothyroidism    Migraine    Ovarian cyst    Pneumonia    Spinal stenosis    Takotsubo syndrome    November 2016   Thrombocytosis 01/24/2016    PAST SURGICAL HISTORY: Past Surgical History:  Procedure Laterality Date   BIOPSY  03/22/2019   Procedure: BIOPSY;  Surgeon: Danie Binder, MD;  [facility];  Service: Endoscopy;;   CARDIAC CATHETERIZATION N/A 02/23/2015   Procedure: Left Heart Cath and Coronary Angiography;  Surgeon: Jettie Booze, MD;  Location: [facility];  Service: Cardiovascular;  Laterality: N/A;   CHOLECYSTECTOMY     DOPPLER ECHOCARDIOGRAPHY     ESOPHAGOGASTRODUODENOSCOPY (EGD) WITH PROPOFOL N/A 03/22/2019   Procedure: ESOPHAGOGASTRODUODENOSCOPY (EGD) WITH PROPOFOL;  Surgeon: Danie Binder, MD;  [facility];  Service: Endoscopy;  Laterality: N/A;  2:45pm   GUM SURGERY     Removed cyst   LEEP     SAVORY DILATION N/A 03/22/2019   Procedure: SAVORY DILATION;  Surgeon: Danie Binder, MD;  [facility];  Service: Endoscopy;  Laterality: N/A;    FAMILY HISTORY: Family History  Problem Relation Age of Onset   Hypertension Father    Breast  cancer Mother        metastatic to brain   Migraines Sister    Migraines Brother    Heart attack Maternal Grandfather    Seizures Neg Hx    Colon cancer Neg Hx    Colon polyps Neg Hx     SOCIAL HISTORY: Social History   Socioeconomic History   Marital status: Married    Spouse name: Richard   Number of children: 1   Years of education: HS   Highest education level: Not on file  Occupational History  Occupation: retired  Tobacco Use   Smoking status: Every Day    Packs/day: 0.50    Years: 30.00    Total pack years: 15.00    Types: Cigarettes   Smokeless tobacco: Never  Substance and Sexual Activity   Alcohol use: Not Currently    Alcohol/week: 0.0 standard drinks of alcohol   Drug use: No   Sexual activity: Not on file  Other Topics Concern   Not on file  Social History Narrative   She lives with husband and daughter.  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   Patient does not drink caffeine.   Patient is right handed.    SPENDS FREE TIME: WITH GRAND KIDS, READ([employer]), CHURCH, [place], SINGS.   Social Determinants of Health   Financial Resource Strain: Not on file  Food Insecurity: Not on file  Transportation Needs: Not on file  Physical Activity: Not on file  Stress: Not on file  Social Connections: Not on file  Intimate Partner Violence: Not on file      PHYSICAL EXAM  Vitals:   05/08/22 0907  BP: (!) 140/77  Pulse: 80  Weight: 157 lb 3.2 oz (71.3 kg)  Height: 5' (1.524 m)   Body mass index is 30.7 kg/m.  Generalized: Well developed, in no acute distress   Neurological examination  Mentation: Alert oriented to time, place, history taking. Follows all commands speech and language fluent Cranial nerve II-XII: Pupils were equal round reactive to light. Extraocular movements were full, visual field were full on confrontational test. Facial sensation and strength were normal.  Head turning and shoulder shrug   were normal and symmetric. Motor: The motor testing reveals 5 over 5 strength of all 4 extremities. Good symmetric motor tone is noted throughout.  Sensory: Sensory testing is intact to soft touch on all 4 extremities. No evidence of extinction is noted.  Coordination: Cerebellar testing reveals good finger-nose-finger and heel-to-shin bilaterally.  Gait and station: Gait is normal.   DIAGNOSTIC DATA (LABS, IMAGING, TESTING) - I reviewed patient records, labs, notes, testing and imaging myself where available.  Lab Results  Component Value Date   WBC 11.7 (H) 03/26/2017   HGB 12.4 03/26/2017   HCT 37.8 03/26/2017   MCV 95.9 03/26/2017   PLT 360 03/26/2017      Component Value Date/Time   NA 126 (L) 03/26/2017 1402   K 4.0 03/26/2017 1402   CL 93 (L) 03/26/2017 1402   CO2 24 03/26/2017 1402   GLUCOSE 111 (H) 03/26/2017 1402   BUN 14 03/26/2017 1402   CREATININE 0.74 03/26/2017 1402   CALCIUM 9.0 03/26/2017 1402   PROT 6.8 02/26/2016 1147   ALBUMIN 3.8 02/26/2016 1147   AST 63 (H) 02/26/2016 1147   ALT 95 (H) 02/26/2016 1147   ALKPHOS 225 (H) 02/26/2016 1147   BILITOT 0.8 02/26/2016 1147   GFRNONAA >60 03/26/2017 1402   GFRAA >60 03/26/2017 1402   Lab Results  Component Value Date   CHOL 208 (H) 02/23/2015   HDL 43 02/23/2015   LDLCALC 146 (H) 02/23/2015   TRIG 95 02/23/2015   CHOLHDL 4.8 02/23/2015   Lab Results  Component Value Date   HGBA1C 5.7 05/04/2013   Lab Results  Component Value Date   VITAMINB12 913 03/26/2017   Lab Results  Component Value Date   TSH 0.639 02/24/2015      ASSESSMENT AND PLAN 67 y.o. year old female  has a  past medical history of Anxiety, Chronic insomnia (07/27/2018), Chronic kidney disease, Chronic pain, Common migraine with intractable migraine (07/27/2018), Essential hypertension, Folate deficiency (05/22/2016), GERD (gastroesophageal reflux disease), History of cardiac catheterization, History of pneumonia, Hypothyroidism, Migraine,  Ovarian cyst, Pneumonia, Spinal stenosis, Takotsubo syndrome, and Thrombocytosis (01/24/2016). here with:  Seizures  Continue Keppra 500 mg in the AM and 1000 mg in the PM  Insmonia  Referral for sleep evaluation to rule out obstructive sleep apnea Referral to psychiatry for cognitive behavioral therapy for insomnia  FU in 6 months or sooner if needed     Ward Givens, MSN, NP-C 05/08/2022, 9:45 AM [facility] [address], [place], [place] [address] [phone]

## 2022-05-08 NOTE — Telephone Encounter (Signed)
Referral for psychiatry fax to [facility]. Phone: [phone], Fax: [phone]

## 2022-05-12 ENCOUNTER — Other Ambulatory Visit: Payer: Self-pay | Admitting: Adult Health

## 2022-07-01 DIAGNOSIS — I10 Essential (primary) hypertension: Secondary | ICD-10-CM | POA: Diagnosis not present

## 2022-07-01 DIAGNOSIS — F5101 Primary insomnia: Secondary | ICD-10-CM | POA: Diagnosis not present

## 2022-07-01 DIAGNOSIS — M1991 Primary osteoarthritis, unspecified site: Secondary | ICD-10-CM | POA: Diagnosis not present

## 2022-07-01 DIAGNOSIS — F419 Anxiety disorder, unspecified: Secondary | ICD-10-CM | POA: Diagnosis not present

## 2022-07-08 DIAGNOSIS — I10 Essential (primary) hypertension: Secondary | ICD-10-CM | POA: Diagnosis not present

## 2022-09-04 ENCOUNTER — Encounter: Payer: Self-pay | Admitting: Adult Health

## 2022-09-04 ENCOUNTER — Telehealth: Payer: Self-pay | Admitting: Adult Health

## 2022-09-04 NOTE — Telephone Encounter (Signed)
LVM and sent letter in mail informing pt of need to reschedule 11/13/22 appt - NP out

## 2022-11-07 ENCOUNTER — Other Ambulatory Visit: Payer: Self-pay | Admitting: Adult Health

## 2022-11-11 ENCOUNTER — Telehealth: Payer: Self-pay | Admitting: Adult Health

## 2022-11-11 MED ORDER — LEVETIRACETAM 500 MG PO TABS: ORAL_TABLET | ORAL | 5 refills | Status: DC

## 2022-11-11 NOTE — Telephone Encounter (Signed)
Pt sent a refill request on 7/19 for  levETIRAcetam (KEPPRA) 500 MG  to be refilled. Pt requesting refill be sent to  [facility]   Today. Stated she is completely out of medication.

## 2022-11-11 NOTE — Telephone Encounter (Signed)
Spoke to patient made her aware did send refill request to pharmacy this am

## 2022-11-13 ENCOUNTER — Ambulatory Visit: Payer: PPO | Admitting: Adult Health

## 2022-12-10 ENCOUNTER — Ambulatory Visit: Payer: PPO | Admitting: Adult Health

## 2022-12-10 ENCOUNTER — Encounter: Payer: Self-pay | Admitting: Adult Health

## 2022-12-10 ENCOUNTER — Telehealth: Payer: Self-pay | Admitting: Adult Health

## 2022-12-10 VITALS — BP 166/79 | HR 71 | Ht 60.0 in | Wt 149.4 lb

## 2022-12-10 DIAGNOSIS — R569 Unspecified convulsions: Secondary | ICD-10-CM | POA: Diagnosis not present

## 2022-12-10 DIAGNOSIS — F5104 Psychophysiologic insomnia: Secondary | ICD-10-CM

## 2022-12-10 MED ORDER — LEVETIRACETAM 500 MG PO TABS: ORAL_TABLET | ORAL | 3 refills | Status: DC

## 2022-12-10 NOTE — Progress Notes (Signed)
PATIENT: Rita Stephens DOB: 02-19-1956  REASON FOR VISIT: follow up HISTORY FROM: patient   Chief Complaint  Patient presents with   Follow-up    Pt in 18 Pt here for seizure Pt states no seizures since last visit Pt states o questions or concerns for todays visit      HISTORY OF PRESENT ILLNESS: Today 12/10/22:  Rita Stephens is a 67 y.o. female with a history of Seizures and chronic insomnia. Returns today for follow-up. Denies seizure events. Continues on Keppra.   In reference to insomnia- at the last visit she was referred for sleep evaluation and to psychiatry. Our sleep lab tried to contact her but was unable to reach her. She also has not seen psychiatry. Reports that she is sleeping better now, She returns today for follow-up.    05/08/22: Rita Stephens is a 67 y.o. female with a history of seizures and chronic insomnia. Returns today for follow-up.  She reports that she has not had any seizure events.  Remains on Keppra 500 mg in the morning and at 1000 mg at bedtime.  She reports that she continues to struggle with insomnia.  Only gets about 4 hours of sleep each night.  She states that she now has security cameras in her home and has found that she has been sleepwalking.  This happens several times a week.  On average she only gets about 4 hours of sleep each night.  She does report that she has been told that she snores.  She reports has daytime sleepiness and she does not sleep well at night.  In the past she was referred for sleep evaluation but never followed through with a sleep study.  Last sleep study was in 2014.  Has never seen psychiatry for chronic insomnia.  No longer taking Seroquel    04/10/21: Rita Stephens Is a 67 year old female with a history of seizures. She returns today for follow-up. Continue Keppra 500 mg and 1000 mg at bedtime. Denies any seizure like event. Reports that the last seizure was at the beginning of the year. Continue seroquel 300 mg at bedtime  for insomnia. She struggles with insomnia despite medication. Returns today for follow-up.   HISTORY Rita Stephens is a 5 year old right-handed white female with a history of seizures.  The patient has been on Zonegran since 2016.  She states that over the last several years every time she takes her medication she gets sweats about 30 minutes afterwards.  The patient ran out of her Zonegran a week or so ago, when she was off her medication she had no problems but when she restarted the drug the sweats returned.  She comes in today wishing to come off of the Zonegran and switch to another antiepileptic medication.  She is still having some headaches, she has about 1 headache a week that may last all day, she remains on Ajovy.  REVIEW OF SYSTEMS: Out of a complete 14 system review of symptoms, the patient complains only of the following symptoms, and all other reviewed systems are negative.  See HPI  ALLERGIES: Allergies  Allergen Reactions   Amitriptyline Itching    Allergy identified by daughter   Actifed Cold-Allergy [Chlorpheniramine-Phenyleph Er] Other (See Comments)    ALTERS MENTAL STATUS   Levothyroxine     Skin peels in between fingers and breaks out into sweats     Prednisone     Can't stay awake   Sudafed [Pseudoephedrine Hcl]  Other (See Comments)    Climbs walls   Triptans     Increased confusion and falls   Zonegran [Zonisamide]     Sweats   Other Palpitations    Topamax    HOME MEDICATIONS: Outpatient Medications Prior to Visit  Medication Sig Dispense Refill   ALPRAZolam (XANAX) 1 MG tablet Take 3 mg by mouth at bedtime.     aspirin EC 81 MG EC tablet Take 1 tablet (81 mg total) by mouth daily.     CINNAMON PO Take by mouth.     ibuprofen (ADVIL,MOTRIN) 200 MG tablet Take 400-800 mg by mouth every 6 (six) hours as needed for moderate pain.     levETIRAcetam (KEPPRA) 500 MG tablet TAKE ONE TABLET BY MOUTH IN THE MORNING AND TWO TABLETS IN THE EVENING 90 tablet 5    levothyroxine (SYNTHROID, LEVOTHROID) 88 MCG tablet Take 112 mcg by mouth daily before breakfast.     Melatonin 10 MG TABS Take 50 mg by mouth at bedtime. (Patient not taking: Reported on 05/08/2022)     Multiple Vitamin (MULTI-VITAMIN DAILY PO) Take by mouth. (Patient not taking: Reported on 05/08/2022)     Probiotic Product (PROBIOTIC-10 PO) Take by mouth. (Patient not taking: Reported on 05/08/2022)     vitamin A 3 MG (10000 UNITS) capsule Take 10,000 Units by mouth daily. (Patient not taking: Reported on 05/08/2022)     Zinc Sulfate (ZINC 15 PO) Take by mouth. (Patient not taking: Reported on 05/08/2022)     No facility-administered medications prior to visit.    PAST MEDICAL HISTORY: Past Medical History:  Diagnosis Date   Anxiety    Chronic insomnia 07/27/2018   Chronic kidney disease    Chronic pain    Common migraine with intractable migraine 07/27/2018   Essential hypertension    Folate deficiency 05/22/2016   GERD (gastroesophageal reflux disease)    History of cardiac catheterization    Normal coronaries November 2016   History of pneumonia    Hypothyroidism    Migraine    Ovarian cyst    Pneumonia    Spinal stenosis    Takotsubo syndrome    November 2016   Thrombocytosis 01/24/2016    PAST SURGICAL HISTORY: Past Surgical History:  Procedure Laterality Date   BIOPSY  03/22/2019   Procedure: BIOPSY;  Surgeon: West Bali, MD;  [facility]: [facility];  Service: Endoscopy;;   CARDIAC CATHETERIZATION N/A 02/23/2015   Procedure: Left Heart Cath and Coronary Angiography;  Surgeon: Corky Crafts, MD;  Location: [facility];  Service: Cardiovascular;  Laterality: N/A;   CHOLECYSTECTOMY     DOPPLER ECHOCARDIOGRAPHY     ESOPHAGOGASTRODUODENOSCOPY (EGD) WITH PROPOFOL N/A 03/22/2019   Procedure: ESOPHAGOGASTRODUODENOSCOPY (EGD) WITH PROPOFOL;  Surgeon: West Bali, MD;  [facility];  Service: Endoscopy;  Laterality: N/A;  2:45pm   GUM SURGERY      Removed cyst   LEEP     SAVORY DILATION N/A 03/22/2019   Procedure: SAVORY DILATION;  Surgeon: West Bali, MD;  [facility];  Service: Endoscopy;  Laterality: N/A;    FAMILY HISTORY: Family History  Problem Relation Age of Onset   Hypertension Father    Breast cancer Mother        metastatic to brain   Migraines Sister    Migraines Brother    Heart attack Maternal Grandfather    Seizures Neg Hx    Colon cancer Neg Hx  Colon polyps Neg Hx     SOCIAL HISTORY: Social History   Socioeconomic History   Marital status: Married    Spouse name: Richard   Number of children: 1   Years of education: HS   Highest education level: Not on file  Occupational History   Occupation: retired  Tobacco Use   Smoking status: Every Day    Current packs/day: 0.50    Average packs/day: 0.5 packs/day for 30.0 years (15.0 ttl pk-yrs)    Types: Cigarettes   Smokeless tobacco: Never  Substance and Sexual Activity   Alcohol use: Not Currently    Alcohol/week: 0.0 standard drinks of alcohol   Drug use: No   Sexual activity: Not on file  Other Topics Concern   Not on file  Social History Narrative   She lives with husband and daughter.  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   Patient does not drink caffeine.   Patient is right handed.    SPENDS FREE TIME: WITH GRAND KIDS, READ([employer]), CHURCH, [place] [place], SINGS.   Social Determinants of Health   Financial Resource Strain: Not on file  Food Insecurity: Not on file  Transportation Needs: Not on file  Physical Activity: Not on file  Stress: Not on file  Social Connections: Not on file  Intimate Partner Violence: Not on file      PHYSICAL EXAM  Vitals:   12/10/22 1119  BP: (!) 166/79  Pulse: 71  Weight: 149 lb 6.4 oz (67.8 kg)  Height: 5' (1.524 m)    Body mass index is 29.18 kg/m.  Generalized: Well developed, in no acute distress   Neurological examination  Mentation:  Alert oriented to time, place, history taking. Follows all commands speech and language fluent Cranial nerve II-XII: Pupils were equal round reactive to light. Extraocular movements were full, visual field were full on confrontational test. Facial sensation and strength were normal.  Head turning and shoulder shrug  were normal and symmetric. Motor: The motor testing reveals 5 over 5 strength of all 4 extremities. Good symmetric motor tone is noted throughout.  Sensory: Sensory testing is intact to soft touch on all 4 extremities. No evidence of extinction is noted.  Coordination: Cerebellar testing reveals good finger-nose-finger and heel-to-shin bilaterally.  Gait and station: Gait is normal.   DIAGNOSTIC DATA (LABS, IMAGING, TESTING) - I reviewed patient records, labs, notes, testing and imaging myself where available.  Lab Results  Component Value Date   WBC 11.7 (H) 03/26/2017   HGB 12.4 03/26/2017   HCT 37.8 03/26/2017   MCV 95.9 03/26/2017   PLT 360 03/26/2017      Component Value Date/Time   NA 126 (L) 03/26/2017 1402   K 4.0 03/26/2017 1402   CL 93 (L) 03/26/2017 1402   CO2 24 03/26/2017 1402   GLUCOSE 111 (H) 03/26/2017 1402   BUN 14 03/26/2017 1402   CREATININE 0.74 03/26/2017 1402   CALCIUM 9.0 03/26/2017 1402   PROT 6.8 02/26/2016 1147   ALBUMIN 3.8 02/26/2016 1147   AST 63 (H) 02/26/2016 1147   ALT 95 (H) 02/26/2016 1147   ALKPHOS 225 (H) 02/26/2016 1147   BILITOT 0.8 02/26/2016 1147   GFRNONAA >60 03/26/2017 1402   GFRAA >60 03/26/2017 1402   Lab Results  Component Value Date   CHOL 208 (H) 02/23/2015   HDL 43 02/23/2015   LDLCALC 146 (H) 02/23/2015   TRIG 95 02/23/2015   CHOLHDL 4.8 02/23/2015  Lab Results  Component Value Date   HGBA1C 5.7 05/04/2013   Lab Results  Component Value Date   VITAMINB12 913 03/26/2017   Lab Results  Component Value Date   TSH 0.639 02/24/2015      ASSESSMENT AND PLAN 67 y.o. year old female  has a past medical  history of Anxiety, Chronic insomnia (07/27/2018), Chronic kidney disease, Chronic pain, Common migraine with intractable migraine (07/27/2018), Essential hypertension, Folate deficiency (05/22/2016), GERD (gastroesophageal reflux disease), History of cardiac catheterization, History of pneumonia, Hypothyroidism, Migraine, Ovarian cyst, Pneumonia, Spinal stenosis, Takotsubo syndrome, and Thrombocytosis (01/24/2016). here with:  Seizures  Continue Keppra 500 mg in the AM and 1000 mg in the PM  Insmonia  Reports improvement, will monitor for now  FU in 6 months or sooner if needed     Butch Penny, MSN, NP-C 12/10/2022, 11:17 AM [facility] [address] [place], [place] [address] [phone]

## 2022-12-10 NOTE — Telephone Encounter (Signed)
Thanks, med list updated.

## 2022-12-10 NOTE — Telephone Encounter (Signed)
FYI pt takes Levothyroxine Sodium 112 mcg, Levetiracetan 500 mg, Alprazolam 1 mg, Quetiapine Fumerate 100 mg, Temazepan 15 mg

## 2022-12-11 DIAGNOSIS — I10 Essential (primary) hypertension: Secondary | ICD-10-CM | POA: Diagnosis not present

## 2022-12-11 DIAGNOSIS — F419 Anxiety disorder, unspecified: Secondary | ICD-10-CM | POA: Diagnosis not present

## 2022-12-11 DIAGNOSIS — M1991 Primary osteoarthritis, unspecified site: Secondary | ICD-10-CM | POA: Diagnosis not present

## 2022-12-11 DIAGNOSIS — F5101 Primary insomnia: Secondary | ICD-10-CM | POA: Diagnosis not present

## 2023-02-10 DIAGNOSIS — F5101 Primary insomnia: Secondary | ICD-10-CM | POA: Diagnosis not present

## 2023-02-10 DIAGNOSIS — I10 Essential (primary) hypertension: Secondary | ICD-10-CM | POA: Diagnosis not present

## 2023-02-10 DIAGNOSIS — M1991 Primary osteoarthritis, unspecified site: Secondary | ICD-10-CM | POA: Diagnosis not present

## 2023-02-10 DIAGNOSIS — R011 Cardiac murmur, unspecified: Secondary | ICD-10-CM | POA: Diagnosis not present

## 2023-02-10 DIAGNOSIS — E063 Autoimmune thyroiditis: Secondary | ICD-10-CM | POA: Diagnosis not present

## 2023-02-10 DIAGNOSIS — Z9229 Personal history of other drug therapy: Secondary | ICD-10-CM | POA: Diagnosis not present

## 2023-02-10 DIAGNOSIS — E663 Overweight: Secondary | ICD-10-CM | POA: Diagnosis not present

## 2023-02-10 DIAGNOSIS — F419 Anxiety disorder, unspecified: Secondary | ICD-10-CM | POA: Diagnosis not present

## 2023-02-10 DIAGNOSIS — Z0001 Encounter for general adult medical examination with abnormal findings: Secondary | ICD-10-CM | POA: Diagnosis not present

## 2023-02-10 DIAGNOSIS — R197 Diarrhea, unspecified: Secondary | ICD-10-CM | POA: Diagnosis not present

## 2023-02-10 DIAGNOSIS — Z1331 Encounter for screening for depression: Secondary | ICD-10-CM | POA: Diagnosis not present

## 2023-02-10 DIAGNOSIS — Z6827 Body mass index (BMI) 27.0-27.9, adult: Secondary | ICD-10-CM | POA: Diagnosis not present

## 2023-02-12 LAB — LAB REPORT - SCANNED
EGFR: 91
TSH: 0.19 — AB (ref 0.41–5.90)

## 2023-03-09 ENCOUNTER — Ambulatory Visit: Payer: PPO | Attending: Cardiology | Admitting: Cardiology

## 2023-03-09 ENCOUNTER — Encounter: Payer: Self-pay | Admitting: Cardiology

## 2023-03-09 VITALS — BP 122/80 | HR 86 | Ht 60.0 in | Wt 155.6 lb

## 2023-03-09 DIAGNOSIS — R011 Cardiac murmur, unspecified: Secondary | ICD-10-CM

## 2023-03-09 NOTE — Progress Notes (Signed)
Clinical Summary Ms. Kretschmer is a 67 y.o.female seen today as a new patient for the following medical problems. Last seen by Dr Diona Browner in 2017  1.Heart murmur - new diagnosis by pcp - no SOB/DOE, no chest pains - no exertional symptoms.     2. History of Takotsubo cardiomyoparhy 02/2015 echo LVEF 40-45%, apical WMAs 02/2015 cath: no significant CAD 05/2015 echo: LVEF 60-65%, grade I dd   Past Medical History:  Diagnosis Date   Anxiety    Chronic insomnia 07/27/2018   Chronic kidney disease    Chronic pain    Common migraine with intractable migraine 07/27/2018   Essential hypertension    Folate deficiency 05/22/2016   GERD (gastroesophageal reflux disease)    History of cardiac catheterization    Normal coronaries November 2016   History of pneumonia    Hypothyroidism    Migraine    Ovarian cyst    Pneumonia    Spinal stenosis    Takotsubo syndrome    November 2016   Thrombocytosis 01/24/2016     Allergies  Allergen Reactions   Amitriptyline Itching    Allergy identified by daughter   Actifed Cold-Allergy [Chlorpheniramine-Phenyleph Er] Other (See Comments)    ALTERS MENTAL STATUS   Levothyroxine     Skin peels in between fingers and breaks out into sweats     Prednisone     Can't stay awake   Sudafed [Pseudoephedrine Hcl] Other (See Comments)    Climbs walls   Triptans     Increased confusion and falls   Zonegran [Zonisamide]     Sweats   Other Palpitations    Topamax     Current Outpatient Medications  Medication Sig Dispense Refill   ALPRAZolam (XANAX) 1 MG tablet Take 3 mg by mouth at bedtime.     APPLE CIDER VINEGAR PO Take by mouth.     aspirin EC 81 MG EC tablet Take 1 tablet (81 mg total) by mouth daily.     BIOTIN PO Take by mouth.     CINNAMON PO Take by mouth.     ibuprofen (ADVIL,MOTRIN) 200 MG tablet Take 400-800 mg by mouth as needed for moderate pain.     levETIRAcetam (KEPPRA) 500 MG tablet TAKE ONE TABLET BY MOUTH IN THE MORNING  AND TWO TABLETS IN THE EVENING 270 tablet 3   levothyroxine (SYNTHROID) 112 MCG tablet Take 112 mcg by mouth daily.     Melatonin 10 MG TABS Take 50 mg by mouth at bedtime.     Multiple Vitamin (MULTI-VITAMIN DAILY PO) Take by mouth. (Patient not taking: Reported on 12/10/2022)     Probiotic Product (PROBIOTIC-10 PO) Take by mouth.     QUEtiapine (SEROQUEL) 100 MG tablet Take 100 mg by mouth.     temazepam (RESTORIL) 15 MG capsule Take 15 mg by mouth at bedtime as needed.     UNABLE TO FIND Med Name: Hemp oil     vitamin A 3 MG (10000 UNITS) capsule Take 10,000 Units by mouth daily.     Zinc Sulfate (ZINC 15 PO) Take by mouth.     No current facility-administered medications for this visit.     Past Surgical History:  Procedure Laterality Date   BIOPSY  03/22/2019   Procedure: BIOPSY;  Surgeon: West Bali, MD;  Location: [facility];  Service: Endoscopy;;   CARDIAC CATHETERIZATION N/A 02/23/2015   Procedure: Left Heart Cath and Coronary Angiography;  Surgeon: Corky Crafts,  MD;  Location: [facility];  Service: Cardiovascular;  Laterality: N/A;   CHOLECYSTECTOMY     DOPPLER ECHOCARDIOGRAPHY     ESOPHAGOGASTRODUODENOSCOPY (EGD) WITH PROPOFOL N/A 03/22/2019   Procedure: ESOPHAGOGASTRODUODENOSCOPY (EGD) WITH PROPOFOL;  Surgeon: West Bali, MD;  Location: [facility];  Service: Endoscopy;  Laterality: N/A;  2:45pm   GUM SURGERY     Removed cyst   LEEP     SAVORY DILATION N/A 03/22/2019   Procedure: SAVORY DILATION;  Surgeon: West Bali, MD;  Location: [facility];  Service: Endoscopy;  Laterality: N/A;     Allergies  Allergen Reactions   Amitriptyline Itching    Allergy identified by daughter   Actifed Cold-Allergy [Chlorpheniramine-Phenyleph Er] Other (See Comments)    ALTERS MENTAL STATUS   Levothyroxine     Skin peels in between fingers and breaks out into sweats     Prednisone     Can't stay awake   Sudafed [Pseudoephedrine Hcl] Other (See  Comments)    Climbs walls   Triptans     Increased confusion and falls   Zonegran [Zonisamide]     Sweats   Other Palpitations    Topamax      Family History  Problem Relation Age of Onset   Hypertension Father    Breast cancer Mother        metastatic to brain   Migraines Sister    Migraines Brother    Heart attack Maternal Grandfather    Seizures Neg Hx    Colon cancer Neg Hx    Colon polyps Neg Hx      Social History Ms. Delatour reports that she has been smoking cigarettes. She has a 15 pack-year smoking history. She has never used smokeless tobacco. Ms. Muhlbauer reports that she does not currently use alcohol.   Review of Systems CONSTITUTIONAL: No weight loss, fever, chills, weakness or fatigue.  HEENT: Eyes: No visual loss, blurred vision, double vision or yellow sclerae.No hearing loss, sneezing, congestion, runny nose or sore throat.  SKIN: No rash or itching.  CARDIOVASCULAR: per hpi RESPIRATORY: No shortness of breath, cough or sputum.  GASTROINTESTINAL: No anorexia, nausea, vomiting or diarrhea. No abdominal pain or blood.  GENITOURINARY: No burning on urination, no polyuria NEUROLOGICAL: No headache, dizziness, syncope, paralysis, ataxia, numbness or tingling in the extremities. No change in bowel or bladder control.  MUSCULOSKELETAL: No muscle, back pain, joint pain or stiffness.  LYMPHATICS: No enlarged nodes. No history of splenectomy.  PSYCHIATRIC: No history of depression or anxiety.  ENDOCRINOLOGIC: No reports of sweating, cold or heat intolerance. No polyuria or polydipsia.  [name] Kitchen   Physical Examination Today's Vitals   03/09/23 1041  BP: 122/80  Pulse: 86  SpO2: 95%  Weight: 155 lb 9.6 oz (70.6 kg)  Height: 5' (1.524 m)   Body mass index is 30.39 kg/m.  Gen: resting comfortably, no acute distress HEENT: no scleral icterus, pupils equal round and reactive, no palptable cervical adenopathy,  CV: RRR, 2/6 systolic murmur rusb, no jvd Resp: Clear to  auscultation bilaterally GI: abdomen is soft, non-tender, non-distended, normal bowel sounds, no hepatosplenomegaly MSK: extremities are warm, no edema.  Skin: warm, no rash Neuro:  no focal deficits Psych: appropriate affect   Diagnostic Studies Echocardiogram 05/28/2015: Study Conclusions  - Left ventricle: The cavity size was normal. Wall thickness was   normal. Systolic function was normal. The estimated ejection   fraction was in the range of 60% to 65%. Wall motion  was normal;   there were no regional wall motion abnormalities. Doppler   parameters are consistent with abnormal left ventricular   relaxation (grade 1 diastolic dysfunction). Indeterminate filling   pressures. - Mitral valve: Calcified annulus. Mildly thickened leaflets . - Left atrium: The atrium was moderately dilated. - Inferior vena cava: The vessel was small, appearing collapsed,   consistent with low central venous pressure.  02/2015 cath There is severe left ventricular systolic dysfunction in a pattern of Takatsubo cardiomyopathy. No significant CAD.   02/2015 echo Study Conclusions   - Left ventricle: The cavity size was normal. Wall thickness was    normal. Systolic function was mildly to moderately reduced. The    estimated ejection fraction was in the range of 40% to 45%.    Doppler parameters are consistent with high ventricular filling    pressure.  - Mitral valve: There was mild regurgitation.  - Left atrium: The atrium was moderately dilated.  - Tricuspid valve: There was moderate regurgitation.  - Pulmonary arteries: PA peak pressure: 72 mm Hg (S).     Assessment and Plan   1.Heart murmur - will obtain echo to further evaluate, f/u pending results.   EKG today shows SR, nonspecific ST/T changes     Antoine Poche, M.D.

## 2023-03-09 NOTE — Patient Instructions (Addendum)
Medication Instructions:   Continue all current medications.   Labwork:  none  Testing/Procedures:  Your physician has requested that you have an echocardiogram. Echocardiography is a painless test that uses sound waves to create images of your heart. It provides your doctor with information about the size and shape of your heart and how well your heart's chambers and valves are working. This procedure takes approximately one hour. There are no restrictions for this procedure. Please do NOT wear cologne, perfume, aftershave, or lotions (deodorant is allowed). Please arrive 15 minutes prior to your appointment time.  Please note: We ask at that you not bring children with you during ultrasound (echo/ vascular) testing. Due to room size and safety concerns, children are not allowed in the ultrasound rooms during exams. Our front office staff cannot provide observation of children in our lobby area while testing is being conducted. An adult accompanying a patient to their appointment will only be allowed in the ultrasound room at the discretion of the ultrasound technician under special circumstances. We apologize for any inconvenience. Office will contact with results via phone, letter or mychart.   Follow-Up:  Pending test results   Any Other Special Instructions Will Be Listed Below (If Applicable).   If you need a refill on your cardiac medications before your next appointment, please call your pharmacy.

## 2023-03-23 ENCOUNTER — Other Ambulatory Visit: Payer: PPO

## 2023-03-30 ENCOUNTER — Ambulatory Visit: Payer: PPO | Attending: Cardiology

## 2023-03-30 DIAGNOSIS — R011 Cardiac murmur, unspecified: Secondary | ICD-10-CM

## 2023-03-31 LAB — ECHOCARDIOGRAM COMPLETE
AR max vel: 1.73 cm2
AV Area VTI: 1.9 cm2
AV Area mean vel: 1.97 cm2
AV Mean grad: 4 mm[Hg]
AV Peak grad: 8.6 mm[Hg]
Ao pk vel: 1.47 m/s
Area-P 1/2: 3.91 cm2
Calc EF: 51.5 %
MV VTI: 1.87 cm2
S' Lateral: 3 cm
Single Plane A2C EF: 52.6 %
Single Plane A4C EF: 54 %

## 2023-04-14 ENCOUNTER — Encounter: Payer: Self-pay | Admitting: *Deleted

## 2023-05-21 DIAGNOSIS — F419 Anxiety disorder, unspecified: Secondary | ICD-10-CM | POA: Diagnosis not present

## 2023-06-03 ENCOUNTER — Emergency Department (HOSPITAL_COMMUNITY): Payer: HMO

## 2023-06-03 ENCOUNTER — Encounter (HOSPITAL_COMMUNITY): Payer: Self-pay

## 2023-06-03 ENCOUNTER — Other Ambulatory Visit: Payer: Self-pay

## 2023-06-03 ENCOUNTER — Emergency Department (HOSPITAL_COMMUNITY)
Admission: EM | Admit: 2023-06-03 | Discharge: 2023-06-03 | Disposition: A | Discharge status: Home / Self Care | Payer: HMO

## 2023-06-03 DIAGNOSIS — Z6828 Body mass index (BMI) 28.0-28.9, adult: Secondary | ICD-10-CM | POA: Diagnosis not present

## 2023-06-03 DIAGNOSIS — R0602 Shortness of breath: Secondary | ICD-10-CM | POA: Insufficient documentation

## 2023-06-03 DIAGNOSIS — I10 Essential (primary) hypertension: Secondary | ICD-10-CM | POA: Diagnosis not present

## 2023-06-03 DIAGNOSIS — E663 Overweight: Secondary | ICD-10-CM | POA: Diagnosis not present

## 2023-06-03 DIAGNOSIS — R4182 Altered mental status, unspecified: Secondary | ICD-10-CM | POA: Insufficient documentation

## 2023-06-03 DIAGNOSIS — Z20828 Contact with and (suspected) exposure to other viral communicable diseases: Secondary | ICD-10-CM | POA: Diagnosis not present

## 2023-06-03 DIAGNOSIS — J9601 Acute respiratory failure with hypoxia: Secondary | ICD-10-CM | POA: Diagnosis not present

## 2023-06-03 DIAGNOSIS — R051 Acute cough: Secondary | ICD-10-CM | POA: Insufficient documentation

## 2023-06-03 DIAGNOSIS — R6889 Other general symptoms and signs: Secondary | ICD-10-CM | POA: Diagnosis not present

## 2023-06-03 DIAGNOSIS — Z720 Tobacco use: Secondary | ICD-10-CM | POA: Insufficient documentation

## 2023-06-03 DIAGNOSIS — Z7982 Long term (current) use of aspirin: Secondary | ICD-10-CM | POA: Insufficient documentation

## 2023-06-03 DIAGNOSIS — R9389 Abnormal findings on diagnostic imaging of other specified body structures: Secondary | ICD-10-CM | POA: Diagnosis not present

## 2023-06-03 LAB — COMPREHENSIVE METABOLIC PANEL
ALT: 36 U/L (ref 0–44)
AST: 21 U/L (ref 15–41)
Albumin: 3.5 g/dL (ref 3.5–5.0)
Alkaline Phosphatase: 99 U/L (ref 38–126)
Anion gap: 11 (ref 5–15)
BUN: 8 mg/dL (ref 8–23)
CO2: 24 mmol/L (ref 22–32)
Calcium: 8.9 mg/dL (ref 8.9–10.3)
Chloride: 98 mmol/L (ref 98–111)
Creatinine, Ser: 0.81 mg/dL (ref 0.44–1.00)
GFR, Estimated: >60 mL/min (ref >60)
Glucose, Bld: 112 mg/dL — ABNORMAL HIGH (ref 70–99)
Potassium: 3.9 mmol/L (ref 3.5–5.1)
Sodium: 133 mmol/L — ABNORMAL LOW (ref 135–145)
Total Bilirubin: 0.4 mg/dL (ref 0.0–1.2)
Total Protein: 7.3 g/dL (ref 6.5–8.1)

## 2023-06-03 LAB — RESP PANEL BY RT-PCR (RSV, FLU A&B, COVID)  RVPGX2
Influenza A by PCR: NEGATIVE
Influenza B by PCR: NEGATIVE
Resp Syncytial Virus by PCR: NEGATIVE
SARS Coronavirus 2 by RT PCR: NEGATIVE

## 2023-06-03 LAB — URINALYSIS, ROUTINE W REFLEX MICROSCOPIC
Bilirubin Urine: NEGATIVE
Glucose, UA: NEGATIVE mg/dL
Hgb urine dipstick: NEGATIVE
Ketones, ur: NEGATIVE mg/dL
Leukocytes,Ua: NEGATIVE
Nitrite: NEGATIVE
Protein, ur: NEGATIVE mg/dL
Specific Gravity, Urine: 1.004 — ABNORMAL LOW (ref 1.005–1.030)
pH: 6 (ref 5.0–8.0)

## 2023-06-03 LAB — CBC WITH DIFFERENTIAL/PLATELET
Abs Immature Granulocytes: 0.24 10*3/uL — ABNORMAL HIGH (ref 0.00–0.07)
Basophils Absolute: 0.1 10*3/uL (ref 0.0–0.1)
Basophils Relative: 1 %
Eosinophils Absolute: 0.3 10*3/uL (ref 0.0–0.5)
Eosinophils Relative: 3 %
HCT: 38.2 % (ref 36.0–46.0)
Hemoglobin: 12.6 g/dL (ref 12.0–15.0)
Immature Granulocytes: 3 %
Lymphocytes Relative: 17 %
Lymphs Abs: 1.4 10*3/uL (ref 0.7–4.0)
MCH: 30.4 pg (ref 26.0–34.0)
MCHC: 33 g/dL (ref 30.0–36.0)
MCV: 92 fL (ref 80.0–100.0)
Monocytes Absolute: 0.9 10*3/uL (ref 0.1–1.0)
Monocytes Relative: 11 %
Neutro Abs: 5 10*3/uL (ref 1.7–7.7)
Neutrophils Relative %: 65 %
Platelets: 533 10*3/uL — ABNORMAL HIGH (ref 150–400)
RBC: 4.15 MIL/uL (ref 3.87–5.11)
RDW: 12.4 % (ref 11.5–15.5)
WBC: 7.8 10*3/uL (ref 4.0–10.5)
nRBC: 0 % (ref 0.0–0.2)

## 2023-06-03 LAB — TROPONIN I (HIGH SENSITIVITY)
Troponin I (High Sensitivity): 7 ng/L (ref <18)
Troponin I (High Sensitivity): 5 ng/L (ref <18)

## 2023-06-03 MED ORDER — IPRATROPIUM-ALBUTEROL 0.5-2.5 (3) MG/3ML IN SOLN
3.0000 mL | Freq: Once | RESPIRATORY_TRACT | Status: AC
  Administered 2023-06-03: 3 mL via RESPIRATORY_TRACT
  Filled 2023-06-03: qty 3

## 2023-06-03 MED ORDER — ALBUTEROL SULFATE HFA 108 (90 BASE) MCG/ACT IN AERS: 1.0000 | INHALATION_SPRAY | Freq: Four times a day (QID) | RESPIRATORY_TRACT | 0 refills | Status: AC | PRN

## 2023-06-03 MED ORDER — DOXYCYCLINE HYCLATE 100 MG PO TABS
100.0000 mg | ORAL_TABLET | Freq: Once | ORAL | Status: AC
  Administered 2023-06-03: 100 mg via ORAL
  Filled 2023-06-03: qty 1

## 2023-06-03 MED ORDER — AMOXICILLIN-POT CLAVULANATE 875-125 MG PO TABS
1.0000 | ORAL_TABLET | Freq: Once | ORAL | Status: AC
  Administered 2023-06-03: 1 via ORAL
  Filled 2023-06-03: qty 1

## 2023-06-03 MED ORDER — ALBUTEROL SULFATE HFA 108 (90 BASE) MCG/ACT IN AERS: 2.0000 | INHALATION_SPRAY | RESPIRATORY_TRACT | Status: DC | PRN

## 2023-06-03 MED ORDER — AMOXICILLIN-POT CLAVULANATE 875-125 MG PO TABS: 1.0000 | ORAL_TABLET | Freq: Two times a day (BID) | ORAL | 0 refills | Status: AC

## 2023-06-03 MED ORDER — DOXYCYCLINE HYCLATE 100 MG PO CAPS: 100.0000 mg | ORAL_CAPSULE | Freq: Two times a day (BID) | ORAL | 0 refills | Status: AC

## 2023-06-03 NOTE — ED Provider Notes (Signed)
[facility] EMERGENCY DEPARTMENT AT [facility] Provider Note   CSN: [id number] Arrival date & time: 06/03/23  4098     History  Chief Complaint  Patient presents with   Shortness of Breath    Rita Stephens is a 68 y.o. female history of ACS, seizures, Takotsubo, tobacco use, hypertension presented for cough along with confusion and reportedly low oxygen.  Family is unsure of how low the oxygen has been but states that on 1 February she was taking care of the grandkids and became sick afterwards with a cough and decreased appetite.  Daughter states that patient's been short of breath however patient denies this.  Patient is been coughing up clear sputum.  There have been no fevers or leg swelling.  Patient is unsure of dysuria but denies abdominal pain nausea vomiting chest pain.  Home Medications Prior to Admission medications   Medication Sig Start Date End Date Taking? Authorizing Provider  albuterol (VENTOLIN HFA) 108 (90 Base) MCG/ACT inhaler Inhale 1-2 puffs into the lungs every 6 (six) hours as needed for wheezing or shortness of breath. 06/03/23  Yes Evlyn Kanner T, PA-C  amoxicillin-clavulanate (AUGMENTIN) 875-125 MG tablet Take 1 tablet by mouth every 12 (twelve) hours for 5 days. 06/03/23 06/08/23 Yes Yeraldin Litzenberger, Beverly Gust, PA-C  doxycycline (VIBRAMYCIN) 100 MG capsule Take 1 capsule (100 mg total) by mouth 2 (two) times daily for 5 days. 06/03/23 06/08/23 Yes Sylvia Helms, Beverly Gust, PA-C  ALPRAZolam Prudy Feeler) 1 MG tablet Take 3 mg by mouth at bedtime.    [provider]  amLODipine (NORVASC) 5 MG tablet Take 10 mg by mouth daily. 02/10/23   [provider]  APPLE CIDER VINEGAR PO Take by mouth.    [provider]  aspirin EC 81 MG EC tablet Take 1 tablet (81 mg total) by mouth daily. 02/25/15   Bhagat, Sharrell Ku, PA  BIOTIN PO Take by mouth. Patient not taking: Reported on 03/09/2023    [provider]  CINNAMON PO Take by mouth. Patient not  taking: Reported on 03/09/2023    [provider]  cyclobenzaprine (FLEXERIL) 10 MG tablet Take 10 mg by mouth 3 (three) times daily as needed for muscle spasms. 02/10/23   [provider]  ibuprofen (ADVIL,MOTRIN) 200 MG tablet Take 400-800 mg by mouth as needed for moderate pain.    [provider]  levETIRAcetam (KEPPRA) 500 MG tablet TAKE ONE TABLET BY MOUTH IN THE MORNING AND TWO TABLETS IN THE EVENING 12/10/22   Butch Penny, NP  levothyroxine (SYNTHROID) 112 MCG tablet Take 112 mcg by mouth daily. 11/17/22   [provider]  Melatonin 10 MG TABS Take 50 mg by mouth at bedtime. Patient not taking: Reported on 03/09/2023    [provider]  Multiple Vitamin (MULTI-VITAMIN DAILY PO) Take by mouth. Patient not taking: Reported on 12/10/2022    [provider]  pantoprazole (PROTONIX) 40 MG tablet Take 40 mg by mouth daily. 02/11/23   [provider]  Probiotic Product (PROBIOTIC-10 PO) Take by mouth. Patient not taking: Reported on 03/09/2023    [provider]  QUEtiapine (SEROQUEL) 100 MG tablet Take 100 mg by mouth. 10/27/22   [provider]  temazepam (RESTORIL) 15 MG capsule Take 15 mg by mouth at bedtime as needed. 12/09/22   [provider]  UNABLE TO FIND Med Name: Hemp oil    [provider]  vitamin A 3 MG (10000 UNITS) capsule Take 10,000 Units by mouth  daily. Patient not taking: Reported on 03/09/2023    [provider]  Zinc Sulfate (ZINC 15 PO) Take by mouth. Patient not taking: Reported on 03/09/2023    [provider]      Allergies    Amitriptyline, Actifed cold-allergy [chlorpheniramine-phenyleph er], Prednisone, Sudafed [pseudoephedrine hcl], Triptans, Zonegran [zonisamide], and Other    Review of Systems   Review of Systems  Respiratory:  Positive for shortness of breath.     Physical Exam Updated Vital Signs BP 129/60 (BP Location: Left Arm)    Pulse 75   Temp 98.5 F (36.9 C) (Oral)   Resp 13   Ht 5' (1.524 m)   Wt 71.7 kg   SpO2 94%   BMI 30.86 kg/m  Physical Exam Constitutional:      General: She is not in acute distress. Cardiovascular:     Rate and Rhythm: Normal rate and regular rhythm.     Pulses: Normal pulses.     Heart sounds: Normal heart sounds.  Pulmonary:     Effort: Pulmonary effort is normal. No respiratory distress.     Breath sounds: Wheezing present.     Comments: Able to speak in full sentences Musculoskeletal:     Right lower leg: No edema.     Left lower leg: No edema.  Skin:    General: Skin is warm and dry.  Neurological:     General: No focal deficit present.     Mental Status: She is alert and oriented to person, place, and time.  Psychiatric:        Mood and Affect: Mood normal.     ED Results / Procedures / Treatments   Labs (all labs ordered are listed, but only abnormal results are displayed) Labs Reviewed  COMPREHENSIVE METABOLIC PANEL - Abnormal; Notable for the following components:      Result Value   Sodium 133 (*)    Glucose, Bld 112 (*)    All other components within normal limits  CBC WITH DIFFERENTIAL/PLATELET - Abnormal; Notable for the following components:   Platelets 533 (*)    Abs Immature Granulocytes 0.24 (*)    All other components within normal limits  URINALYSIS, ROUTINE W REFLEX MICROSCOPIC - Abnormal; Notable for the following components:   Color, Urine STRAW (*)    Specific Gravity, Urine 1.004 (*)    All other components within normal limits  RESP PANEL BY RT-PCR (RSV, FLU A&B, COVID)  [id number]  TROPONIN I (HIGH SENSITIVITY)  TROPONIN I (HIGH SENSITIVITY)    EKG EKG Interpretation Date/Time:  Wednesday June 03 2023 09:13:14 EST Ventricular Rate:  82 PR Interval:  132 QRS Duration:  109 QT Interval:  415 QTC Calculation: 485 R Axis:   80  Text Interpretation: Sinus rhythm Atrial premature complexes Consider right atrial enlargement  Anteroseptal infarct, age indeterminate Lateral leads are also involved Confirmed by Estanislado Pandy [phone]) on 06/03/2023 9:52:06 AM  Radiology CT Head Wo Contrast Result Date: 06/03/2023 CLINICAL DATA:  Altered mental status EXAM: CT HEAD WITHOUT CONTRAST TECHNIQUE: Contiguous axial images were obtained from the base of the skull through the vertex without intravenous contrast. RADIATION DOSE REDUCTION: This exam was performed according to the departmental dose-optimization program which includes automated exposure control, adjustment of the mA and/or kV according to patient size and/or use of iterative reconstruction technique. COMPARISON:  Brain MR 05/15/2020 FINDINGS: Brain: No hemorrhage. No hydrocephalus. No extra-axial fluid collection. No mass effect. No mass lesion. No CT evidence of an acute  cortical infarct there is a background of mild chronic microvascular ischemic change Vascular: No hyperdense vessel or unexpected calcification. Skull: Normal. Negative for fracture or focal lesion. Sinuses/Orbits: Moderate left-sided mastoid effusion. Frothy secretions in the right sphenoid sinus. Orbits are unremarkable. Other: None. IMPRESSION: 1. No CT evidence of intracranial abnormality. 2. Moderate left-sided mastoid effusion. No specific etiology for altered mental status identified. Electronically Signed   By: Lorenza Cambridge M.D.   On: 06/03/2023 12:04   DG Chest 2 View Result Date: 06/03/2023 CLINICAL DATA:  Shortness of breath. EXAM: CHEST - 2 VIEW COMPARISON:  05/06/2011. FINDINGS: There are linear areas of atelectasis/scarring at the lung bases, left more than right. Bilateral lung fields are otherwise clear. No dense consolidation or lung collapse. Note is made of elevated left hemidiaphragm. Bilateral costophrenic angles are clear. Normal cardio-mediastinal silhouette. No acute osseous abnormalities. The soft tissues are within normal limits. IMPRESSION: No active cardiopulmonary disease.  Electronically Signed   By: Jules Schick M.D.   On: 06/03/2023 10:48    Procedures Procedures    Medications Ordered in ED Medications  amoxicillin-clavulanate (AUGMENTIN) 875-125 MG per tablet 1 tablet (has no administration in time range)  doxycycline (VIBRA-TABS) tablet 100 mg (has no administration in time range)  ipratropium-albuterol (DUONEB) 0.5-2.5 (3) MG/3ML nebulizer solution 3 mL (3 mLs Nebulization Given 06/03/23 1042)    ED Course/ Medical Decision Making/ A&P                                 Medical Decision Making Amount and/or Complexity of Data Reviewed Labs: ordered. Radiology: ordered.  Risk Prescription drug management.   Meriel Flavors 68 y.o. presented today for shortness of breath.  Working DDx that I considered at this time includes, but not limited to, asthma/COPD exacerbation, URI, viral illness, anemia, ACS, PE, pneumonia, pleural effusion, lung cancer, CHF, respiratory distress, medication side effect, intoxication.  R/o DDx: Asthma exacerbation, anemia, ACS, PE, pneumonia, pleural effusion, lung cancer, CHF, respiratory distress, medication side effect, intoxication: These are considered less likely due to history of present illness, physical exam, labs/imaging findings  Review of prior external notes: 03/09/2023 office visit  Unique Tests and My Independent Interpretation:  CBC: Unremarkable CMP: Unremarkable EKG: Sinus 82 bpm with PACs, no ST elevation or depressions noted, no signs of right heart strain Troponin: 7, 5 UA: Unremarkable CXR: No acute findings Respiratory Panel: Unremarkable CT head without contrast: Unremarkable  Social Determinants of Health: none  Discussion with Independent Historian:  Daughter  Discussion of Management of Tests: None  Risk: Medium: prescription drug management  Risk Stratification Score: none  Staffed with Young, DO  Plan: On exam patient was in no acute distress with stable vitals. Physical  exam showed wheezing bilaterally with nonproductive cough on exam. The cardiac monitor was ordered secondary to the patient's history of shortness of breath and to monitor the patient for dysrhythmia. Cardiac monitor by my independent interpretation showed normal sinus.  Patient reportedly has been hypoxic however family is unsure of how low her oxygen has been.  There does seem to be sick contacts at home and even though her flu and COVID were negative in the office I am unable to see these results so we will repeat this.  Patient is alert and oriented to self year and place and does not appear confused on exam.  Patient did have wheezing bilaterally so we will give DuoNeb treatment and  obtain labs and imaging.  Daughter was endorsing altered mental status and is concerned patient's been falling at home and even the patient is on any blood thinners and a AOx3 here will get CT head to rule out any intracranial pathology.  Patient's labs and imaging are reassuring including delta troponin.  On reevaluation patient was doing well with stable vitals.  Patient was ambulated did not become hypoxic.  Patient does feel better after breathing treatment.  At this time I do suspect patient has underlying COPD most likely exacerbated by a possible viral illness.  Patient is AOx3 still and upon my independent rotation of the cardiac monitor still has normal sinus.  I had shared decision making with the patient and patient states that she would like to be discharged and follow-up with her primary care provider.  At this time patient does not meet admission criteria and states that she would like to go home which is reasonable.  Will give first dose of antibiotics here as I do suspect she has underlying COPD from her smoking history and will have her follow-up with her primary care provider for further evaluation of this.  Will also prescribe albuterol inhaler.  Patient was given return precautions. Patient stable for  discharge at this time.  Patient verbalized understanding of plan.  Discussed smoking cessation with patient and was they were offerred resources to help stop.  Total time was 5 min CPT code 16109.   This chart was dictated using voice recognition software.  Despite best efforts to proofread,  errors can occur which can change the documentation meaning.         Final Clinical Impression(s) / ED Diagnoses Final diagnoses:  Acute cough    Rx / DC Orders ED Discharge Orders          Ordered    amoxicillin-clavulanate (AUGMENTIN) 875-125 MG tablet  Every 12 hours        06/03/23 1224    doxycycline (VIBRAMYCIN) 100 MG capsule  2 times daily        06/03/23 1224    albuterol (VENTOLIN HFA) 108 (90 Base) MCG/ACT inhaler  Every 6 hours PRN        06/03/23 1224              Netta Corrigan, PA-C 06/03/23 1228    Coral Spikes, DO 06/03/23 1458

## 2023-06-03 NOTE — ED Triage Notes (Signed)
Pt sent by PCP this morning for low oxygen saturation. Family unsure what reading was. Pt has been around a granddaughter that has been sick. Pt has productive cough and denies any lung disease. Per family covid and flu negative at office. Per family pt has had some confusion since February 1 and is getting worse along with the cough.

## 2023-06-03 NOTE — Discharge Instructions (Addendum)
Please follow-up with the care provider in regards recent ER visit.  Today your labs and imaging are reassuring and after our discussion we will try outpatient therapy.  He did have some wheezing and so he may have some underlying COPD that needs to be evaluated by primary care provider but in the meantime I have prescribed you an albuterol inhaler to help with any shortness of breath he might have at home along with antibiotics to cover for any COPD exacerbation or pneumonia.  If symptoms change or worsen please return to the ER.

## 2023-06-18 DIAGNOSIS — H35432 Paving stone degeneration of retina, left eye: Secondary | ICD-10-CM | POA: Diagnosis not present

## 2023-06-18 DIAGNOSIS — H2513 Age-related nuclear cataract, bilateral: Secondary | ICD-10-CM | POA: Diagnosis not present

## 2023-06-18 DIAGNOSIS — H35033 Hypertensive retinopathy, bilateral: Secondary | ICD-10-CM | POA: Diagnosis not present

## 2023-06-18 DIAGNOSIS — H3521 Other non-diabetic proliferative retinopathy, right eye: Secondary | ICD-10-CM | POA: Diagnosis not present

## 2023-06-18 DIAGNOSIS — Q141 Congenital malformation of retina: Secondary | ICD-10-CM | POA: Diagnosis not present

## 2023-06-18 DIAGNOSIS — H43813 Vitreous degeneration, bilateral: Secondary | ICD-10-CM | POA: Diagnosis not present

## 2023-06-18 DIAGNOSIS — H35371 Puckering of macula, right eye: Secondary | ICD-10-CM | POA: Diagnosis not present

## 2023-06-18 DIAGNOSIS — H33051 Total retinal detachment, right eye: Secondary | ICD-10-CM | POA: Diagnosis not present

## 2023-06-19 DIAGNOSIS — E6609 Other obesity due to excess calories: Secondary | ICD-10-CM | POA: Diagnosis not present

## 2023-06-19 DIAGNOSIS — I10 Essential (primary) hypertension: Secondary | ICD-10-CM | POA: Diagnosis not present

## 2023-06-19 DIAGNOSIS — K219 Gastro-esophageal reflux disease without esophagitis: Secondary | ICD-10-CM | POA: Diagnosis not present

## 2023-06-19 DIAGNOSIS — M1991 Primary osteoarthritis, unspecified site: Secondary | ICD-10-CM | POA: Diagnosis not present

## 2023-06-19 DIAGNOSIS — E063 Autoimmune thyroiditis: Secondary | ICD-10-CM | POA: Diagnosis not present

## 2023-06-22 DIAGNOSIS — I10 Essential (primary) hypertension: Secondary | ICD-10-CM | POA: Diagnosis not present

## 2023-06-22 DIAGNOSIS — E663 Overweight: Secondary | ICD-10-CM | POA: Diagnosis not present

## 2023-06-22 DIAGNOSIS — E063 Autoimmune thyroiditis: Secondary | ICD-10-CM | POA: Diagnosis not present

## 2023-06-22 DIAGNOSIS — Z6828 Body mass index (BMI) 28.0-28.9, adult: Secondary | ICD-10-CM | POA: Diagnosis not present

## 2023-06-22 DIAGNOSIS — F419 Anxiety disorder, unspecified: Secondary | ICD-10-CM | POA: Diagnosis not present

## 2023-06-24 DIAGNOSIS — H3521 Other non-diabetic proliferative retinopathy, right eye: Secondary | ICD-10-CM | POA: Diagnosis not present

## 2023-06-24 DIAGNOSIS — H33011 Retinal detachment with single break, right eye: Secondary | ICD-10-CM | POA: Diagnosis not present

## 2023-06-24 DIAGNOSIS — H35371 Puckering of macula, right eye: Secondary | ICD-10-CM | POA: Diagnosis not present

## 2023-06-24 DIAGNOSIS — H3341 Traction detachment of retina, right eye: Secondary | ICD-10-CM | POA: Diagnosis not present

## 2023-07-02 DIAGNOSIS — H3521 Other non-diabetic proliferative retinopathy, right eye: Secondary | ICD-10-CM | POA: Diagnosis not present

## 2023-07-02 DIAGNOSIS — Z9889 Other specified postprocedural states: Secondary | ICD-10-CM | POA: Diagnosis not present

## 2023-07-02 DIAGNOSIS — H35371 Puckering of macula, right eye: Secondary | ICD-10-CM | POA: Diagnosis not present

## 2023-08-06 DIAGNOSIS — H3521 Other non-diabetic proliferative retinopathy, right eye: Secondary | ICD-10-CM | POA: Diagnosis not present

## 2023-08-06 DIAGNOSIS — H35371 Puckering of macula, right eye: Secondary | ICD-10-CM | POA: Diagnosis not present

## 2023-08-06 DIAGNOSIS — Z9889 Other specified postprocedural states: Secondary | ICD-10-CM | POA: Diagnosis not present

## 2023-09-02 DIAGNOSIS — Z9889 Other specified postprocedural states: Secondary | ICD-10-CM | POA: Diagnosis not present

## 2023-09-02 DIAGNOSIS — H35371 Puckering of macula, right eye: Secondary | ICD-10-CM | POA: Diagnosis not present

## 2023-09-02 DIAGNOSIS — H3521 Other non-diabetic proliferative retinopathy, right eye: Secondary | ICD-10-CM | POA: Diagnosis not present

## 2023-09-17 ENCOUNTER — Other Ambulatory Visit: Payer: Self-pay | Admitting: Internal Medicine

## 2023-09-17 DIAGNOSIS — H18413 Arcus senilis, bilateral: Secondary | ICD-10-CM | POA: Diagnosis not present

## 2023-09-17 DIAGNOSIS — Z1231 Encounter for screening mammogram for malignant neoplasm of breast: Secondary | ICD-10-CM

## 2023-09-17 DIAGNOSIS — H25043 Posterior subcapsular polar age-related cataract, bilateral: Secondary | ICD-10-CM | POA: Diagnosis not present

## 2023-09-17 DIAGNOSIS — H25013 Cortical age-related cataract, bilateral: Secondary | ICD-10-CM | POA: Diagnosis not present

## 2023-09-17 DIAGNOSIS — H35432 Paving stone degeneration of retina, left eye: Secondary | ICD-10-CM | POA: Diagnosis not present

## 2023-09-17 DIAGNOSIS — H2511 Age-related nuclear cataract, right eye: Secondary | ICD-10-CM | POA: Diagnosis not present

## 2023-09-17 DIAGNOSIS — H2513 Age-related nuclear cataract, bilateral: Secondary | ICD-10-CM | POA: Diagnosis not present

## 2023-09-18 DIAGNOSIS — E663 Overweight: Secondary | ICD-10-CM | POA: Diagnosis not present

## 2023-09-18 DIAGNOSIS — K580 Irritable bowel syndrome with diarrhea: Secondary | ICD-10-CM | POA: Diagnosis not present

## 2023-09-18 DIAGNOSIS — Z6829 Body mass index (BMI) 29.0-29.9, adult: Secondary | ICD-10-CM | POA: Diagnosis not present

## 2023-09-18 DIAGNOSIS — G894 Chronic pain syndrome: Secondary | ICD-10-CM | POA: Diagnosis not present

## 2023-09-18 DIAGNOSIS — I10 Essential (primary) hypertension: Secondary | ICD-10-CM | POA: Diagnosis not present

## 2023-09-18 DIAGNOSIS — F5101 Primary insomnia: Secondary | ICD-10-CM | POA: Diagnosis not present

## 2023-09-18 DIAGNOSIS — F419 Anxiety disorder, unspecified: Secondary | ICD-10-CM | POA: Diagnosis not present

## 2023-09-23 ENCOUNTER — Inpatient Hospital Stay: Admission: RE | Admit: 2023-09-23 | Source: Ambulatory Visit

## 2023-10-14 DIAGNOSIS — Z8669 Personal history of other diseases of the nervous system and sense organs: Secondary | ICD-10-CM | POA: Diagnosis not present

## 2023-10-14 DIAGNOSIS — H2511 Age-related nuclear cataract, right eye: Secondary | ICD-10-CM | POA: Diagnosis not present

## 2023-10-14 DIAGNOSIS — Z48810 Encounter for surgical aftercare following surgery on the sense organs: Secondary | ICD-10-CM | POA: Diagnosis not present

## 2023-10-14 DIAGNOSIS — H35371 Puckering of macula, right eye: Secondary | ICD-10-CM | POA: Diagnosis not present

## 2023-10-14 DIAGNOSIS — T85398A Other mechanical complication of other ocular prosthetic devices, implants and grafts, initial encounter: Secondary | ICD-10-CM | POA: Diagnosis not present

## 2023-10-14 DIAGNOSIS — H31001 Unspecified chorioretinal scars, right eye: Secondary | ICD-10-CM | POA: Diagnosis not present

## 2023-10-22 DIAGNOSIS — Z9889 Other specified postprocedural states: Secondary | ICD-10-CM | POA: Diagnosis not present

## 2023-10-22 DIAGNOSIS — H35371 Puckering of macula, right eye: Secondary | ICD-10-CM | POA: Diagnosis not present

## 2023-10-22 DIAGNOSIS — T85398A Other mechanical complication of other ocular prosthetic devices, implants and grafts, initial encounter: Secondary | ICD-10-CM | POA: Diagnosis not present

## 2023-10-22 DIAGNOSIS — H4311 Vitreous hemorrhage, right eye: Secondary | ICD-10-CM | POA: Diagnosis not present

## 2023-11-03 DIAGNOSIS — H2512 Age-related nuclear cataract, left eye: Secondary | ICD-10-CM | POA: Diagnosis not present

## 2023-11-11 DIAGNOSIS — H2512 Age-related nuclear cataract, left eye: Secondary | ICD-10-CM | POA: Diagnosis not present

## 2023-11-18 DIAGNOSIS — G894 Chronic pain syndrome: Secondary | ICD-10-CM | POA: Diagnosis not present

## 2023-11-18 DIAGNOSIS — F419 Anxiety disorder, unspecified: Secondary | ICD-10-CM | POA: Diagnosis not present

## 2023-11-18 DIAGNOSIS — I10 Essential (primary) hypertension: Secondary | ICD-10-CM | POA: Diagnosis not present

## 2023-11-19 DIAGNOSIS — H43392 Other vitreous opacities, left eye: Secondary | ICD-10-CM | POA: Diagnosis not present

## 2023-11-19 DIAGNOSIS — H35371 Puckering of macula, right eye: Secondary | ICD-10-CM | POA: Diagnosis not present

## 2023-11-19 DIAGNOSIS — H43812 Vitreous degeneration, left eye: Secondary | ICD-10-CM | POA: Diagnosis not present

## 2023-11-19 DIAGNOSIS — H4311 Vitreous hemorrhage, right eye: Secondary | ICD-10-CM | POA: Diagnosis not present

## 2023-11-19 DIAGNOSIS — Z9889 Other specified postprocedural states: Secondary | ICD-10-CM | POA: Diagnosis not present

## 2023-11-19 DIAGNOSIS — T85398A Other mechanical complication of other ocular prosthetic devices, implants and grafts, initial encounter: Secondary | ICD-10-CM | POA: Diagnosis not present

## 2023-11-19 DIAGNOSIS — H31091 Other chorioretinal scars, right eye: Secondary | ICD-10-CM | POA: Diagnosis not present

## 2023-12-07 ENCOUNTER — Ambulatory Visit: Admitting: Nurse Practitioner

## 2023-12-07 ENCOUNTER — Encounter: Payer: Self-pay | Admitting: Nurse Practitioner

## 2023-12-07 VITALS — BP 142/84 | HR 84 | Ht 60.0 in | Wt 168.0 lb

## 2023-12-07 DIAGNOSIS — F1320 Sedative, hypnotic or anxiolytic dependence, uncomplicated: Secondary | ICD-10-CM

## 2023-12-07 DIAGNOSIS — F5104 Psychophysiologic insomnia: Secondary | ICD-10-CM | POA: Diagnosis not present

## 2023-12-07 DIAGNOSIS — F419 Anxiety disorder, unspecified: Secondary | ICD-10-CM | POA: Diagnosis not present

## 2023-12-07 DIAGNOSIS — M539 Dorsopathy, unspecified: Secondary | ICD-10-CM | POA: Diagnosis not present
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
## 2023-12-07 NOTE — Assessment & Plan Note (Signed)
 Chronic back pain currently managed with NSAIDs. Previous use of opiates tapered due to concurrent benzodiazepine use leading to reduced control. Discussed recommendation for physical therapy. She declines at this time. Monitor closely for GI upset with chronic NSAID use.

## 2023-12-07 NOTE — Assessment & Plan Note (Signed)
 Long-standing anxiety with panic attacks currently occurring at least twice a month. Alprazolam  has been used for management since 1999, but a long-term management plan is needed. We discussed the need for a baseline medication to help reduce the incidents of panic attacks and daily anxiety and the use of alprazolam  and similar medications only for rescue during a panic attack that is not controlled. Previous trials of Prozac and Zoloft were ineffective, although, it is not clear when Zoloft was tried or how long it was taken. She is extremely  apprehensive about changing medications due to past experiences. We discussed the great need for improved management for safety and quality of life purposes. At this time she is not in agreement to the plan to start a daily SSRI or SNRI or to taper her alprazolam  dose. At the current dosages, it is not safe to continue without a taper plan in place. I have recommended psychiatry input on further management.  - Referral to psychiatry to review the initiation of a daily medication for anxiety management and help to safely taper alprazolam .  - Strongly discourage alprazolam  use and recommend a taper schedule with close monitoring to reduce the risk of potential seizures and other withdrawal symptoms.

## 2023-12-07 NOTE — Assessment & Plan Note (Signed)
 Chronic insomnia with difficulty sleeping despite Xanax  and seroquel . Previous failure of temazepam, Ambien, and Belsomra. We discussed at length that Xanax  may contribute to sleep disturbances and other serious consequences. Potential for improved sleep with anxiety management and tapering off Xanax . She is concerned about the impact of tapering on sleep quality. - Discuss with psychiatry potential treatments for insomnia in conjunction with anxiety management.

## 2023-12-07 NOTE — Assessment & Plan Note (Signed)
 Chronic use of Xanax  since 1999, currently taking 2-4 mg daily. It is difficult to determine how much she is taking daily as she reports she has been self-tapering down to 2mg  over a few weeks, but when discussion of how much medication should be remaining, she reports that she may not have been tapering for a few weeks, but maybe a week. This raises significant concerns for the actual dosing that is being taking and how to safely taper. Previous tapering attempts resulted in withdrawal symptoms, including vomiting and a seizure. Previous tapering appeared to be rapid over a few weeks. It is not clear what additional management was put in place during this time.  I have told her that her current dose is unsafe, particularly at her age, with risks of respiratory depression (even mores in the setting of potential COPD), falls, cognitive impairment, and death. I have strongly recommended that for her safety she consider a slow taper off of the medication completely.  She is concerned about withdrawal symptoms and the impact on sleep and anxiety management. She has admitted that her sleep is not managed currently on 2-3mg  of alprazolam  and 300mg  of seroquel , therefore continuing management at this point is both unsafe and ineffective. It is not within my scope to safely continue this pattern.  We discussed at length that her medication can be tapered slowly over a period of a few months, but I would not be writing for 3-4mg  daily. I will consider writing for 2mg  a day with the option for her to use one of the additional remaining pills from this prescription for breath through anxiety symptoms (no more than 2 times a week). If she is currently taking the medication as reported, her last prescription of 120 pills picked up on 11/18/2023 should have at least 30 remaining pills by the end of the month, which would provide for her to have extra pills for break through symptoms for 2 months at a minimum (if no more than  2 per week were used). I explained that this would be more than enough time to allow for a slow taper down to the no more than 2mg  daily. She is adamant that she will become sick with a taper and is not willing to try this.  She has an appointment with neurology later this week and plans to speak with her about continuing the current dosing. I have recommended that she have this conversation with her neurologist to determine if she feels that this is an appropriate management for prevention of withdrawal seizures from her standpoint. I did express that if the neurologist would like her to continue, she will need to continue the medication.  I have encouraged her to go home and count her remaining pills to determine how many she would have if taking 2 per day to help get a clear picture of how many break through tablets would be available. I have made it clear that if she chooses to continue on her current dosing of 3-4mg  a day, she will need to seek another provider to manage this. I feel her current dosing carries too many risks for anxiety management alone and I do not prescribe more than 1mg  benzodiazepine use daily, especially without additional therapies to manage underlying symptoms. I have placed an urgent referral to psychiatry for management assistance in tapering and starting a daily medication for anxiety.   - Refer to psychiatry for management of benzodiazepine taper and start of baseline anxiety medication for daily use  as well as therapy for management. - Consider contacting previous PCP for a temporary prescription to bridge until psychiatry appointment. - Discuss with neurology about the recommendations for ongoing management from their standpoint. Please include concerns with memory and mood changes that have been noted.

## 2023-12-07 NOTE — Patient Instructions (Addendum)
 Go ahead and count out the pills that you have left.   I can write for a maximum of 2mg  of zanax a day and will sent this in for you on the 30th as long as you can stay on the 2mg  and continue to taper.     Managing Anxiety, Adult After being diagnosed with anxiety, you may be relieved to know why you have felt or behaved a certain way. You may also feel overwhelmed about the treatment ahead and what it will mean for your life. With care and support, you can manage your anxiety. How to manage lifestyle changes  Understanding the difference between stress and anxiety Although stress can play a role in anxiety, it is not the same as anxiety. Stress is your body's reaction to life changes and events, both good and bad. Stress is often caused by something external, such as a deadline, test, or competition. It normally goes away after the event has ended and will last just a few hours. But, stress can be ongoing and can lead to more than just stress. Anxiety is caused by something internal, such as imagining a terrible outcome or worrying that something will go wrong that will greatly upset you. Anxiety often does not go away even after the event is over, and it can become a long-term (chronic) worry. Lowering stress and anxiety Talk with your health care provider or a counselor to learn more about lowering anxiety and stress. They may suggest tension-reduction techniques, such as: Music. Spend time creating or listening to music that you enjoy and that inspires you. Mindfulness-based meditation. Practice being aware of your normal breaths while not trying to control your breathing. It can be done while sitting or walking. Centering prayer. Focus on a word, phrase, or sacred image that means something to you and brings you peace. Deep breathing. Expand your stomach and inhale slowly through your nose. Hold your breath for 3-5 seconds. Then breathe out slowly, letting your stomach muscles  relax. Self-talk. Learn to notice and spot thought patterns that lead to anxiety reactions. Change those patterns to thoughts that feel peaceful. Muscle relaxation. Take time to tense muscles and then relax them. Choose a tension-reduction technique that fits your lifestyle and personality. These techniques take time and practice. Set aside 5-15 minutes a day to do them. Specialized therapists can offer counseling and training in these techniques. The training to help with anxiety may be covered by some insurance plans. Other things you can do to manage stress and anxiety include: Keeping a stress diary. This can help you learn what triggers your reaction and then learn ways to manage your response. Thinking about how you react to certain situations. You may not be able to control everything, but you can control your response. Making time for activities that help you relax and not feeling guilty about spending your time in this way. Doing visual imagery. This involves imagining or creating mental pictures to help you relax. Practicing yoga. Through yoga poses, you can lower tension and relax.  Medicines Medicines for anxiety include: Antidepressant medicines. These are usually prescribed for long-term daily control. Anti-anxiety medicines. These may be added in severe cases, especially when panic attacks occur. When used together, medicines, psychotherapy, and tension-reduction techniques may be the most effective treatment. Relationships Relationships can play a big part in helping you recover. Spend more time connecting with trusted friends and family members. Think about going to couples counseling if you have a partner, taking family education  classes, or going to family therapy. Therapy can help you and others better understand your anxiety. How to recognize changes in your anxiety Everyone responds differently to treatment for anxiety. Recovery from anxiety happens when symptoms lessen and  stop interfering with your daily life at home or work. This may mean that you will start to: Have better concentration and focus. Worry will interfere less in your daily thinking. Sleep better. Be less irritable. Have more energy. Have improved memory. Try to recognize when your condition is getting worse. Contact your provider if your symptoms interfere with home or work and you feel like your condition is not improving. Follow these instructions at home: Activity Exercise. Adults should: Exercise for at least 150 minutes each week. The exercise should increase your heart rate and make you sweat (moderate-intensity exercise). Do strengthening exercises at least twice a week. Get the right amount and quality of sleep. Most adults need 7-9 hours of sleep each night. Lifestyle  Eat a healthy diet that includes plenty of vegetables, fruits, whole grains, low-fat dairy products, and lean protein. Do not eat a lot of foods that are high in fats, added sugars, or salt (sodium). Make choices that simplify your life. Do not use any products that contain nicotine or tobacco. These products include cigarettes, chewing tobacco, and vaping devices, such as e-cigarettes. If you need help quitting, ask your provider. Avoid caffeine, alcohol, and certain over-the-counter cold medicines. These may make you feel worse. Ask your pharmacist which medicines to avoid. General instructions Take over-the-counter and prescription medicines only as told by your provider. Keep all follow-up visits. This is to make sure you are managing your anxiety well or if you need more support. Where to find support You can get help and support from: Self-help groups. Online and [employer]. A trusted spiritual leader. Couples counseling. Family education classes. Family therapy. Where to find more information You may find that joining a support group helps you deal with your anxiety. The following sources can  help you find counselors or support groups near you: Mental Health America: mentalhealthamerica.net Anxiety and Depression Association of [place] (ADAA): adaa.org The [employer] on Mental Illness (NAMI): nami.org Contact a health care provider if: You have a hard time staying focused or finishing tasks. You spend many hours a day feeling worried about everyday life. You are very tired because you cannot stop worrying. You start to have headaches or often feel tense. You have chronic nausea or diarrhea. Get help right away if: Your heart feels like it is racing. You have shortness of breath. You have thoughts of hurting yourself or others. Get help right away if you feel like you may hurt yourself or others, or have thoughts about taking your own life. Go to your nearest emergency room or: Call 911. Call the National Suicide Prevention Lifeline at 831-154-8234 or 988. This is open 24 hours a day. Text the Crisis Text Line at (718)828-4715. This information is not intended to replace advice given to you by your health care provider. Make sure you discuss any questions you have with your health care provider. Document Revised: 01/14/2022 Document Reviewed: 07/29/2020 Elsevier Patient Education  2024 [employer].

## 2023-12-09 ENCOUNTER — Encounter: Admitting: Family Medicine

## 2023-12-10 ENCOUNTER — Encounter: Payer: Self-pay | Admitting: Adult Health

## 2023-12-10 ENCOUNTER — Ambulatory Visit: Payer: PPO | Admitting: Adult Health

## 2023-12-10 VITALS — BP 182/83 | HR 67 | Ht <= 58 in | Wt 164.6 lb

## 2023-12-10 DIAGNOSIS — R569 Unspecified convulsions: Secondary | ICD-10-CM | POA: Diagnosis not present

## 2023-12-10 DIAGNOSIS — F5104 Psychophysiologic insomnia: Secondary | ICD-10-CM

## 2023-12-10 MED ORDER — LEVETIRACETAM 500 MG PO TABS
ORAL_TABLET | ORAL | 3 refills | Status: AC
Start: 2023-12-10 — End: ?

## 2023-12-10 NOTE — Progress Notes (Signed)
 PATIENT: Rita Stephens DOB: Dec 10, 1955  REASON FOR VISIT: follow up HISTORY FROM: patient   Chief Complaint  Patient presents with   Follow-up    Rm 20, alone. No seizures. But is Taking xanax  2mg  daily having withdrawl, states seeing new NP to take off all at once. Speak to you about seroquel  and this.  We donot manage xanax .     HISTORY OF PRESENT ILLNESS: Today 12/10/23:  Rita Stephens is a 68 y.o. female with a history of seizures. Returns today for follow-up.  She remains on Keppra .  Denies any seizure events.  At the last visit she reported that her insomnia had improved.  She had initially been referred to our sleep lab and psychiatry both of which deferred on.  She states that she had a follow-up with new primary care Sarah early this week.  She states that she is upset because she was told that she needed to titrate off of Xanax .  She states that she cannot go off of Xanax .  She states that she has been referred to psychiatry but has not gotten an appointment.  She also states that she is on Seroquel  has been taking 100 mg at bedtime.  She does report that 2 weeks ago her daughter found her facedown in the floor.  She is not sure what happened.  She has followed up with PCP.  After speaking with her primary care provider it was relayed to her that there was some concern about her medication.   12/10/22: Rita Stephens is a 68 y.o. female with a history of Seizures and chronic insomnia. Returns today for follow-up. Denies seizure events. Continues on Keppra .   In reference to insomnia- at the last visit she was referred for sleep evaluation and to psychiatry. Our sleep lab tried to contact her but was unable to reach her. She also has not seen psychiatry. Reports that she is sleeping better now, She returns today for follow-up.    05/08/22: Rita Stephens is a 68 y.o. female with a history of seizures and chronic insomnia. Returns today for follow-up.  She reports that she has not  had any seizure events.  Remains on Keppra  500 mg in the morning and at 1000 mg at bedtime.  She reports that she continues to struggle with insomnia.  Only gets about 4 hours of sleep each night.  She states that she now has security cameras in her home and has found that she has been sleepwalking.  This happens several times a week.  On average she only gets about 4 hours of sleep each night.  She does report that she has been told that she snores.  She reports has daytime sleepiness and she does not sleep well at night.  In the past she was referred for sleep evaluation but never followed through with a sleep study.  Last sleep study was in 2014.  Has never seen psychiatry for chronic insomnia.  No longer taking Seroquel     04/10/21: Rita Stephens Is a 68 year old female with a history of seizures. She returns today for follow-up. Continue Keppra  500 mg and 1000 mg at bedtime. Denies any seizure like event. Reports that the last seizure was at the beginning of the year. Continue seroquel  300 mg at bedtime for insomnia. She struggles with insomnia despite medication. Returns today for follow-up.   HISTORY Rita Stephens is a 68 year old right-handed white female with a history of seizures.  The patient  has been on Zonegran  since 2016.  She states that over the last several years every time she takes her medication she gets sweats about 30 minutes afterwards.  The patient ran out of her Zonegran  a week or so ago, when she was off her medication she had no problems but when she restarted the drug the sweats returned.  She comes in today wishing to come off of the Zonegran  and switch to another antiepileptic medication.  She is still having some headaches, she has about 1 headache a week that may last all day, she remains on Ajovy .  REVIEW OF SYSTEMS: Out of a complete 14 system review of symptoms, the patient complains only of the following symptoms, and all other reviewed systems are negative.  See  HPI  ALLERGIES: Allergies  Allergen Reactions   Amitriptyline Itching    Allergy identified by daughter   Actifed Cold-Allergy [Chlorpheniramine-Phenyleph Er] Other (See Comments)    ALTERS MENTAL STATUS   Prednisone     Can't stay awake   Sudafed [Pseudoephedrine Hcl] Other (See Comments)    Climbs walls   Triptans     Increased confusion and falls   Zonegran  [Zonisamide ]     Sweats   Other Palpitations    Topamax    HOME MEDICATIONS: Outpatient Medications Prior to Visit  Medication Sig Dispense Refill   ALPRAZolam  (XANAX ) 1 MG tablet Take 3 mg by mouth at bedtime. (Patient taking differently: Take 3 mg by mouth at bedtime. Pt. Is taking up to four times a day,)     ibuprofen  (ADVIL ,MOTRIN ) 200 MG tablet Take 400-800 mg by mouth as needed for moderate pain.     levETIRAcetam  (KEPPRA ) 500 MG tablet TAKE ONE TABLET BY MOUTH IN THE MORNING AND TWO TABLETS IN THE EVENING 270 tablet 3   levothyroxine  (SYNTHROID ) 112 MCG tablet Take 112 mcg by mouth daily.     pantoprazole  (PROTONIX ) 40 MG tablet Take 40 mg by mouth daily.     Probiotic Product (PROBIOTIC-10 PO) Take by mouth.     QUEtiapine  (SEROQUEL ) 100 MG tablet Take 100 mg by mouth.     albuterol  (VENTOLIN  HFA) 108 (90 Base) MCG/ACT inhaler Inhale 1-2 puffs into the lungs every 6 (six) hours as needed for wheezing or shortness of breath. (Patient not taking: Reported on 12/10/2023) 1 each 0   amLODipine (NORVASC) 5 MG tablet Take 10 mg by mouth daily.     APPLE CIDER VINEGAR PO Take by mouth.     aspirin  EC 81 MG EC tablet Take 1 tablet (81 mg total) by mouth daily.     BIOTIN PO Take by mouth.     cyclobenzaprine (FLEXERIL) 10 MG tablet Take 10 mg by mouth 3 (three) times daily as needed for muscle spasms. (Patient not taking: Reported on 12/10/2023)     No facility-administered medications prior to visit.    PAST MEDICAL HISTORY: Past Medical History:  Diagnosis Date   Anxiety    Chronic insomnia 07/27/2018   Chronic  kidney disease    Chronic pain    Common migraine with intractable migraine 07/27/2018   Essential hypertension    Folate deficiency 05/22/2016   GERD (gastroesophageal reflux disease)    History of cardiac catheterization    Normal coronaries November 2016   History of pneumonia    Hypothyroidism    Migraine    Ovarian cyst    Pneumonia    Spinal stenosis    Takotsubo syndrome    November 2016  Thrombocytosis 01/24/2016    PAST SURGICAL HISTORY: Past Surgical History:  Procedure Laterality Date   BIOPSY  03/22/2019   Procedure: BIOPSY;  Surgeon: Harvey Margo CROME, MD;  Location: [facility];  Service: Endoscopy;;   CARDIAC CATHETERIZATION N/A 02/23/2015   Procedure: Left Heart Cath and Coronary Angiography;  Surgeon: Candyce GORMAN Reek, MD;  Location[facility] INVASIVE CV LAB;  Service: Cardiovascular;  Laterality: N/A;   CATARACT EXTRACTION Bilateral    CHOLECYSTECTOMY     DOPPLER ECHOCARDIOGRAPHY     ESOPHAGOGASTRODUODENOSCOPY (EGD) WITH PROPOFOL  N/A 03/22/2019   Procedure: ESOPHAGOGASTRODUODENOSCOPY (EGD) WITH PROPOFOL ;  Surgeon: Harvey Margo CROME, MD;  Location: [facility];  Service: Endoscopy;  Laterality: N/A;  2:45pm   GUM SURGERY     Removed cyst   LEEP     RETINAL DETACHMENT REPAIR W/ SCLERAL BUCKLE LE Right    SAVORY DILATION N/A 03/22/2019   Procedure: SAVORY DILATION;  Surgeon: Harvey Margo CROME, MD;  Location: [facility];  Service: Endoscopy;  Laterality: N/A;    FAMILY HISTORY: Family History  Problem Relation Age of Onset   Hypertension Father    Breast cancer Mother        metastatic to brain   Migraines Sister    Migraines Brother    Heart attack Maternal Grandfather    Seizures Neg Hx    Colon cancer Neg Hx    Colon polyps Neg Hx     SOCIAL HISTORY: Social History   Socioeconomic History   Marital status: Married    Spouse name: Richard   Number of children: 1   Years of education: HS   Highest education level: Not on file  Occupational  History   Occupation: retired  Tobacco Use   Smoking status: Every Day    Current packs/day: 0.50    Average packs/day: 0.5 packs/day for 30.0 years (15.0 ttl pk-yrs)    Types: Cigarettes   Smokeless tobacco: Never  Substance and Sexual Activity   Alcohol use: Not Currently    Alcohol/week: 0.0 standard drinks of alcohol   Drug use: No   Sexual activity: Not on file  Other Topics Concern   Not on file  Social History Narrative   She lives with husband and daughter.  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   Patient does not drink caffeine.   Patient is right handed.    SPENDS FREE TIME: WITH GRAND KIDS, READ(CH[employer]), [place]URCH, [place], SINGS.   Social Drivers of Corporate investment banker Strain: Not on file  Food Insecurity: Not on file  Transportation Needs: Not on file  Physical Activity: Not on file  Stress: Not on file  Social Connections: Not on file  Intimate Partner Violence: Not on file      PHYSICAL EXAM  Vitals:   12/10/23 1354  BP: (!) 182/83  Pulse: 67  Weight: 164 lb 9.6 oz (74.7 kg)  Height: 4' 10 (1.473 m)    Body mass index is 34.4 kg/m.  Generalized: Well developed, in no acute distress   Neurological examination  Mentation: Alert oriented to time, place, history taking. Follows all commands speech and language fluent Cranial nerve II-XII: Pupils were equal round reactive to light. Extraocular movements were full, visual field were full on confrontational test. Facial sensation and strength were normal.  Head turning and shoulder shrug  were normal and symmetric. Motor: The motor testing reveals 5 over 5 strength of all 4  extremities. Good symmetric motor tone is noted throughout.  Sensory: Sensory testing is intact to soft touch on all 4 extremities. No evidence of extinction is noted.  Coordination: Cerebellar testing reveals good finger-nose-finger and heel-to-shin bilaterally.  Gait and station: Gait is  normal.   DIAGNOSTIC DATA (LABS, IMAGING, TESTING) - I reviewed patient records, labs, notes, testing and imaging myself where available.  Lab Results  Component Value Date   WBC 7.8 06/03/2023   HGB 12.6 06/03/2023   HCT 38.2 06/03/2023   MCV 92.0 06/03/2023   PLT 533 (H) 06/03/2023      Component Value Date/Time   NA 133 (L) 06/03/2023 0941   K 3.9 06/03/2023 0941   CL 98 06/03/2023 0941   CO2 24 06/03/2023 0941   GLUCOSE 112 (H) 06/03/2023 0941   BUN 8 06/03/2023 0941   CREATININE 0.81 06/03/2023 0941   CALCIUM  8.9 06/03/2023 0941   PROT 7.3 06/03/2023 0941   ALBUMIN 3.5 06/03/2023 0941   AST 21 06/03/2023 0941   ALT 36 06/03/2023 0941   ALKPHOS 99 06/03/2023 0941   BILITOT 0.4 06/03/2023 0941   GFRNONAA >60 06/03/2023 0941   GFRAA >60 03/26/2017 1402   Lab Results  Component Value Date   CHOL 208 (H) 02/23/2015   HDL 43 02/23/2015   LDLCALC 146 (H) 02/23/2015   TRIG 95 02/23/2015   CHOLHDL 4.8 02/23/2015   Lab Results  Component Value Date   HGBA1C 5.7 05/04/2013   Lab Results  Component Value Date   VITAMINB12 913 03/26/2017   Lab Results  Component Value Date   TSH 0.639 02/24/2015      ASSESSMENT AND PLAN 68 y.o. year old female  has a past medical history of Anxiety, Chronic insomnia (07/27/2018), Chronic kidney disease, Chronic pain, Common migraine with intractable migraine (07/27/2018), Essential hypertension, Folate deficiency (05/22/2016), GERD (gastroesophageal reflux disease), History of cardiac catheterization, History of pneumonia, Hypothyroidism, Migraine, Ovarian cyst, Pneumonia, Spinal stenosis, Takotsubo syndrome, and Thrombocytosis (01/24/2016). here with:  Seizures Insomnia  Continue Keppra  500 mg in the AM and 1000 mg in the PM Advised that I would discuss Xanax  and Seroquel  with her PCP.  Did encourage her to schedule an appointment with psychiatry as they can better manage her anxiety which probably contributes to insomnia. She returns  today for follow-up.  Addedum: I spoke to the patient's PCP.  She advised that she will continue the patient on 2 tablets of Xanax  a day until she can see psychiatry.  She is also amenable to refill  her Seroquel .  I called patient and made her aware.  Also advised that she can call their office to see where the psychiatry referral was sent so  they can call the psychiatry office to see if she can get a sooner appointment.  Patient verbalized understanding.     Duwaine Russell, MSN, NP-C 12/10/2023, 2:10 PM [facility] [address], [address] [place], [place] [address] [phone]  The patient's condition requires frequent monitoring and adjustments in the treatment plan, reflecting the ongoing complexity of care.  This provider is the continuing focal point for all needed services for this condition.

## 2023-12-25 ENCOUNTER — Other Ambulatory Visit: Payer: Self-pay | Admitting: Nurse Practitioner

## 2023-12-25 ENCOUNTER — Telehealth: Payer: Self-pay | Admitting: Nurse Practitioner

## 2023-12-25 NOTE — Telephone Encounter (Signed)
 Copied from CRM [id number]. Topic: Appointments - Transfer of Care >> Dec 25, 2023 10:56 AM Donna BRAVO wrote: Pt is requesting to transfer FROM: Camie Doing NP Pt is requesting to transfer TO: Dr Vita Reason for requested transfer: Prefer to see a doctor MD It is the responsibility of the team the patient would like to transfer to Dr. Vita to reach out to the patient if for any reason this transfer is not acceptable.

## 2023-12-25 NOTE — Telephone Encounter (Signed)
 Last apt 12/07/23

## 2023-12-25 NOTE — Telephone Encounter (Signed)
 I called patient, she already had an appointment for Monday with Dr Vita.  I did relay to her message from Dr Vita about weaning off xanax .  She stated that the last time they weaned her off too fast and she was vomiting for 3 weeks and she has a detached retina and can not be throwing up. Patient expressed concerns about needing refill on xanax . I relayed to her again the plan is to wean her off the xanax . Patient stated she will need refill next week and I told her that she could discuss that at her appointment. She also stated that neurologist told her that Camie was going to refill her xanax  previously. I told her again about plan to wean her off and that she could discuss at her appointment.

## 2023-12-25 NOTE — Telephone Encounter (Signed)
 Copied from CRM [id number]. Topic: Clinical - Medication Refill >> Dec 25, 2023 10:41 AM Donna BRAVO wrote: Medication:  ALPRAZolam  (XANAX ) 1 MG tablet  Has the patient contacted their pharmacy? No Patient states the pharmacy has her call the provider  This is the patient's preferred pharmacy:   [facility] - [place], [place] - [address] [place] [address] Phone: [phone] Fax: [phone]  Is this the correct pharmacy for this prescription? Yes If no, delete pharmacy and type the correct one.   Has the prescription been filled recently? Yes  Is the patient out of the medication? No  Has the patient been seen for an appointment in the last year OR does the patient have an upcoming appointment? Yes  Can we respond through MyChart? Yes  Agent: Please be advised that Rx refills may take up to 3 business days. We ask that you follow-up with your pharmacy.

## 2023-12-28 ENCOUNTER — Encounter: Payer: Self-pay | Admitting: Family Medicine

## 2023-12-28 ENCOUNTER — Ambulatory Visit (INDEPENDENT_AMBULATORY_CARE_PROVIDER_SITE_OTHER): Admitting: Family Medicine

## 2023-12-28 VITALS — BP 134/84 | HR 74 | Wt 165.0 lb

## 2023-12-28 DIAGNOSIS — F1310 Sedative, hypnotic or anxiolytic abuse, uncomplicated: Secondary | ICD-10-CM

## 2023-12-28 MED ORDER — ALPRAZOLAM 1 MG PO TABS: 1.0000 mg | ORAL_TABLET | Freq: Every evening | ORAL | 0 refills | Status: DC | PRN

## 2023-12-28 NOTE — Progress Notes (Signed)
 Name: Rita Stephens   Date of Visit: 12/28/23   Date of last visit with me: Visit date not found   CHIEF COMPLAINT:  Chief Complaint  Patient presents with   Transitions Of Care    Transfer of care.        HPI:  Discussed the use of AI scribe software for clinical note transcription with the patient, who gave verbal consent to proceed.  History of Present Illness   Rita Stephens is a 68 year old female who presents for medication refill and management of Xanax  dosage.  She has been using Xanax  since the 1990s. Approximately five years ago, an attempt to taper off the medication resulted in significant withdrawal symptoms, including vomiting, a weight loss of thirty pounds, and severe anxiety described as 'a million people was talking' in her brain. Consequently, she resumed Xanax  and discontinued oxycodone , which she was taking for back pain.  Recently, she has been taking two milligrams of Xanax  at night, reduced from a previous dose of four milligrams. This reduction occurred over the past month, with her current regimen being two milligrams nightly for about three weeks. However, she has experienced side effects such as diarrhea, hot flashes, and vomiting, which are concerning due to her history of retinal surgery.  She is concerned about managing panic attacks and anxiety, especially since her husband passed away and she is now living alone. 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 She is worried about the potential for withdrawal symptoms, particularly vomiting, which she associates with previous attempts to reduce her Xanax  dosage.         OBJECTIVE:       12/28/2023   11:17 AM  Depression screen PHQ 2/9  Decreased Interest 0  Down, Depressed, Hopeless 0  PHQ - 2 Score 0     BP Readings from Last 3 Encounters:  12/28/23 134/84  12/10/23 (!) 182/83  12/07/23 (!) 142/84    BP 134/84   Pulse 74    Wt 165 lb (74.8 kg)   SpO2 98%   BMI 34.49 kg/m    Physical Exam          Physical Exam Constitutional:      General: She is not in acute distress.    Appearance: Normal appearance. She is normal weight. She is not ill-appearing, toxic-appearing or diaphoretic.  HENT:     Head: Normocephalic and atraumatic.  Neurological:     General: No focal deficit present.     Mental Status: She is alert and oriented to person, place, and time. Mental status is at baseline.     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 Previous taper attempts caused withdrawal symptoms. Current regimen reduced from 4 mg. Plan to continue tapering to prevent withdrawal and complications. - Prescribed 45 Xanax  pills for the next month. - Instructed to take 2 mg on some nights and 1 mg on alternate nights to taper. - Scheduled follow-up in 3-4 weeks to reassess tapering plan.  Generalized anxiety disorder Current Xanax  management is not ideal due to dependency. Discussed transitioning to Lexapro for better long-term management without Xanax  risks. - Discuss starting Lexapro at next visit if Xanax  taper is tolerated.  Candy Ziegler A. Vita MD [facility]

## 2024-01-04 DIAGNOSIS — F331 Major depressive disorder, recurrent, moderate: Secondary | ICD-10-CM | POA: Diagnosis not present

## 2024-01-04 DIAGNOSIS — F1320 Sedative, hypnotic or anxiolytic dependence, uncomplicated: Secondary | ICD-10-CM | POA: Diagnosis not present

## 2024-01-04 DIAGNOSIS — F411 Generalized anxiety disorder: Secondary | ICD-10-CM | POA: Diagnosis not present

## 2024-01-04 DIAGNOSIS — F5101 Primary insomnia: Secondary | ICD-10-CM | POA: Diagnosis not present

## 2024-01-05 ENCOUNTER — Ambulatory Visit: Payer: Self-pay | Admitting: Nurse Practitioner

## 2024-01-18 ENCOUNTER — Ambulatory Visit: Admitting: Family Medicine

## 2024-01-18 ENCOUNTER — Encounter: Payer: Self-pay | Admitting: Family Medicine

## 2024-01-18 VITALS — BP 126/86 | HR 78 | Wt 165.8 lb

## 2024-01-18 DIAGNOSIS — M62830 Muscle spasm of back: Secondary | ICD-10-CM

## 2024-01-18 DIAGNOSIS — F1310 Sedative, hypnotic or anxiolytic abuse, uncomplicated: Secondary | ICD-10-CM

## 2024-01-18 MED ORDER — CYCLOBENZAPRINE HCL 10 MG PO TABS: 10.0000 mg | ORAL_TABLET | Freq: Three times a day (TID) | ORAL | 0 refills | Status: DC | PRN

## 2024-01-18 MED ORDER — ALPRAZOLAM 2 MG PO TABS: 2.0000 mg | ORAL_TABLET | Freq: Every evening | ORAL | 0 refills | Status: AC | PRN

## 2024-01-18 NOTE — Progress Notes (Signed)
   Name: Rita Stephens   Date of Visit: 01/18/24   Date of last visit with me: 12/28/2023   CHIEF COMPLAINT:  Chief Complaint  Patient presents with   Follow-up    3 weeks follow up on anxiety.        HPI:  Discussed the use of AI scribe software for clinical note transcription with the patient, who gave verbal consent to proceed.  History of Present Illness  Rita Stephens is a 68 year old female who presents with medication management issues related to anxiety and irritable bowel syndrome.  She has been taking Xanax  for anxiety management for approximately thirty years. Recently, she attempted to reduce her dosage from two tablets to one tablet, which resulted in side effects such as hot flashes and vomiting. She is currently taking two tablets per night and requires a refill to maintain this dosage.  She reports frequent diarrhea. She uses Flexeril to manage muscle spasms and abdominal cramps, and it also alleviates her back spasms. She is seeking a refill for Flexeril to continue managing her symptoms.  Her pharmacy informed her that a prescription was called in for thirty days with forty-five pills, but she requires sixty tablets to maintain her current dosage of two tablets per night. She is seeking clarification and adjustment of her prescription to ensure she has enough medication.    OBJECTIVE:       12/28/2023   11:17 AM  Depression screen PHQ 2/9  Decreased Interest 0  Down, Depressed, Hopeless 0  PHQ - 2 Score 0     BP Readings from Last 3 Encounters:  01/18/24 126/86  12/28/23 134/84  12/10/23 (!) 182/83    BP 126/86   Pulse 78   Wt 165 lb 12.8 oz (75.2 kg)   SpO2 98%   BMI 34.65 kg/m    Physical Exam   Physical Exam Constitutional:      Appearance: Normal appearance.  Neurological:     General: No focal deficit present.     Mental Status: She is alert and oriented to person, place, and time. Mental status is at baseline.  Psychiatric:         Mood and Affect: Mood normal.        Behavior: Behavior normal.     ASSESSMENT/PLAN:   Assessment & Plan Xanax  use disorder, mild, abuse (HCC)  Back spasm    Assessment and Plan Assessment & Plan Assessment and Plan    Generalized anxiety disorder Managed with Xanax . Recent taper attempt caused side effects. Multimedia programmer suggested a six-month tapering plan, which was disputed. - Prescribe Xanax  2 mg tablets, 30 tablets for one month supply. - Multimedia programmer to manage Xanax  tapering going forward. - No more refills will occur from our end regarding xanax  but we will manage all other medical issues.   Chronic back muscle spasms Managed with Flexeril. Flexeril ineffective for diarrhea-related cramps, possibly due to IBS. - Prescribe Flexeril for back spasms. - Discuss alternative medications for diarrhea if needed.  General Health Maintenance Needs Medicare Wellness visit to update preventive screenings. - Schedule Medicare Wellness visit. - Ensure mammogram, DEXA scan, and colonoscopy are up to date.           Jamieka Royle A. Vita MD [facility]

## 2024-01-21 DIAGNOSIS — H43812 Vitreous degeneration, left eye: Secondary | ICD-10-CM | POA: Diagnosis not present

## 2024-01-21 DIAGNOSIS — H31091 Other chorioretinal scars, right eye: Secondary | ICD-10-CM | POA: Diagnosis not present

## 2024-01-21 DIAGNOSIS — H35371 Puckering of macula, right eye: Secondary | ICD-10-CM | POA: Diagnosis not present

## 2024-01-21 DIAGNOSIS — Q141 Congenital malformation of retina: Secondary | ICD-10-CM | POA: Diagnosis not present

## 2024-01-21 DIAGNOSIS — H4311 Vitreous hemorrhage, right eye: Secondary | ICD-10-CM | POA: Diagnosis not present

## 2024-02-01 DIAGNOSIS — F331 Major depressive disorder, recurrent, moderate: Secondary | ICD-10-CM | POA: Diagnosis not present

## 2024-02-01 DIAGNOSIS — F411 Generalized anxiety disorder: Secondary | ICD-10-CM | POA: Diagnosis not present

## 2024-02-01 DIAGNOSIS — F1320 Sedative, hypnotic or anxiolytic dependence, uncomplicated: Secondary | ICD-10-CM | POA: Diagnosis not present

## 2024-02-01 DIAGNOSIS — F5101 Primary insomnia: Secondary | ICD-10-CM | POA: Diagnosis not present

## 2024-02-08 DIAGNOSIS — F1320 Sedative, hypnotic or anxiolytic dependence, uncomplicated: Secondary | ICD-10-CM | POA: Diagnosis not present

## 2024-02-08 DIAGNOSIS — F411 Generalized anxiety disorder: Secondary | ICD-10-CM | POA: Diagnosis not present

## 2024-02-16 ENCOUNTER — Other Ambulatory Visit: Payer: Self-pay | Admitting: Family Medicine

## 2024-02-18 ENCOUNTER — Encounter: Payer: Self-pay | Admitting: Family Medicine

## 2024-02-18 ENCOUNTER — Ambulatory Visit (INDEPENDENT_AMBULATORY_CARE_PROVIDER_SITE_OTHER): Admitting: Family Medicine

## 2024-02-18 ENCOUNTER — Telehealth: Payer: Self-pay

## 2024-02-18 VITALS — BP 122/78 | HR 66 | Ht 60.0 in | Wt 169.0 lb

## 2024-02-18 DIAGNOSIS — F1310 Sedative, hypnotic or anxiolytic abuse, uncomplicated: Secondary | ICD-10-CM

## 2024-02-18 DIAGNOSIS — F5104 Psychophysiologic insomnia: Secondary | ICD-10-CM | POA: Diagnosis not present

## 2024-02-18 DIAGNOSIS — E66811 Obesity, class 1: Secondary | ICD-10-CM

## 2024-02-18 DIAGNOSIS — Z1231 Encounter for screening mammogram for malignant neoplasm of breast: Secondary | ICD-10-CM

## 2024-02-18 DIAGNOSIS — Z780 Asymptomatic menopausal state: Secondary | ICD-10-CM | POA: Diagnosis not present

## 2024-02-18 DIAGNOSIS — Z0000 Encounter for general adult medical examination without abnormal findings: Secondary | ICD-10-CM

## 2024-02-18 DIAGNOSIS — Z1211 Encounter for screening for malignant neoplasm of colon: Secondary | ICD-10-CM

## 2024-02-18 DIAGNOSIS — F419 Anxiety disorder, unspecified: Secondary | ICD-10-CM

## 2024-02-18 DIAGNOSIS — Z13220 Encounter for screening for lipoid disorders: Secondary | ICD-10-CM

## 2024-02-18 DIAGNOSIS — F1320 Sedative, hypnotic or anxiolytic dependence, uncomplicated: Secondary | ICD-10-CM | POA: Diagnosis not present

## 2024-02-18 LAB — POCT GLYCOSYLATED HEMOGLOBIN (HGB A1C): Hemoglobin A1C: 5.7 % — AB (ref 4.0–5.6)

## 2024-02-18 NOTE — Telephone Encounter (Signed)
 Copied from CRM #[id number]. Topic: General - Call Back - No Documentation >> Feb 18, 2024  3:45 PM Roselie BROCKS wrote: Reason for CRM: Patient returning call to reilly but does not not show any notes on a call . Patient requests a return call asap  Returned patients called advised that she walked out before getting her labs done. Got her scheduled for Monday.

## 2024-02-18 NOTE — Progress Notes (Signed)
   Name: Rita Stephens   Date of Visit: 02/18/24   Date of last visit with me: 02/16/2024   CHIEF COMPLAINT:  Chief Complaint  Patient presents with   Annual Exam    Awv.        HPI:  Discussed the use of AI scribe software for clinical note transcription with the patient, who gave verbal consent to proceed.  History of Present Illness   Rita Stephens is a 68 year old female who presents for routine follow-up and preventive care.  She experiences ongoing sleep issues, describing her sleep quality as variable. She feels tired and lacks energy, with no energy. She is unsure about snoring.  Her last colonoscopy was performed in 2015 or 2016. She has not had a bone density scan. She stopped menstruating between ages 89 and 2.  She desires to lose weight and has previously attempted to access bariatric services. Her daughter has successfully lost weight using medications like Ozempic and Wegovy. She denies having diabetes but agrees to have her A1c checked as it has been a while since the last test.         OBJECTIVE:       02/18/2024    2:11 PM  Depression screen PHQ 2/9  Decreased Interest 1  Down, Depressed, Hopeless 0  PHQ - 2 Score 1     BP Readings from Last 3 Encounters:  02/18/24 122/78  01/18/24 126/86  12/28/23 134/84    BP 122/78   Pulse 66   Ht 5' (1.524 m)   Wt 169 lb (76.7 kg)   SpO2 98%   BMI 33.01 kg/m    Physical Exam          Physical Exam  ASSESSMENT/PLAN:   Assessment & Plan Medicare annual wellness visit, subsequent  Annual physical exam  Xanax  use disorder, mild, abuse (HCC)  Anxiety  Chronic insomnia  Benzodiazepine dependence (HCC)  Screening for lipoid disorders  Visit for screening mammogram  Post-menopausal  Postmenopausal estrogen deficiency  Encounter for screening colonoscopy  Obesity (BMI 30.0-34.9)    Assessment and Plan    Adult Wellness Visit Routine wellness visit with normal blood pressure.  Discussed weight loss and potential bariatric clinic referral.  -  I reviewed and addressed the Medicare-specific components of today's visit, including preventive services eligibility, screening recommendations, and health maintenance items as applicable.  -- Full history and exam completed today in addition to [facility] Wellness Visit. Reviewed interval concerns, chronic conditions, and preventive care needs. Physical exam performed. Counseling provided on lifestyle, screenings, vaccines, and routine health maintenance. - Order mammogram and coordinate with [facility]. - Order bone density scan for fracture risk assessment. - Order colonoscopy due to last one in 2015 or 2016. - Perform A1c test for diabetes monitoring. - Encouraged phone availability for scheduling. - Plan follow-up in six months.  Insomnia Chronic insomnia with variable sleep quality and low energy.         Rita Stephens A. Vita MD [facility]

## 2024-02-22 ENCOUNTER — Other Ambulatory Visit

## 2024-02-22 DIAGNOSIS — F1320 Sedative, hypnotic or anxiolytic dependence, uncomplicated: Secondary | ICD-10-CM | POA: Diagnosis not present

## 2024-02-22 DIAGNOSIS — F419 Anxiety disorder, unspecified: Secondary | ICD-10-CM | POA: Diagnosis not present

## 2024-02-22 DIAGNOSIS — Z13220 Encounter for screening for lipoid disorders: Secondary | ICD-10-CM | POA: Diagnosis not present

## 2024-02-22 DIAGNOSIS — F5104 Psychophysiologic insomnia: Secondary | ICD-10-CM | POA: Diagnosis not present

## 2024-02-22 DIAGNOSIS — F1310 Sedative, hypnotic or anxiolytic abuse, uncomplicated: Secondary | ICD-10-CM | POA: Diagnosis not present

## 2024-02-22 LAB — LIPID PANEL

## 2024-02-23 LAB — COMPREHENSIVE METABOLIC PANEL WITH GFR
ALT: 18 IU/L (ref 0–32)
AST: 15 IU/L (ref 0–40)
Albumin: 4.2 g/dL (ref 3.9–4.9)
Alkaline Phosphatase: 102 IU/L (ref 49–135)
BUN/Creatinine Ratio: 16 (ref 12–28)
BUN: 13 mg/dL (ref 8–27)
Bilirubin Total: <0.2 mg/dL (ref 0.0–1.2)
CO2: 23 mmol/L (ref 20–29)
Calcium: 9.4 mg/dL (ref 8.7–10.3)
Chloride: 92 mmol/L — AB (ref 96–106)
Creatinine, Ser: 0.81 mg/dL (ref 0.57–1.00)
Globulin, Total: 1.9 g/dL (ref 1.5–4.5)
Glucose: 81 mg/dL (ref 70–99)
Potassium: 5.2 mmol/L (ref 3.5–5.2)
Sodium: 128 mmol/L — AB (ref 134–144)
Total Protein: 6.1 g/dL (ref 6.0–8.5)
eGFR: 80 mL/min/1.73 (ref >59)

## 2024-02-23 LAB — CBC WITH DIFFERENTIAL/PLATELET
Basophils Absolute: 0.1 x10E3/uL (ref 0.0–0.2)
Basos: 1 %
EOS (ABSOLUTE): 0.2 x10E3/uL (ref 0.0–0.4)
Eos: 3 %
Hematocrit: 39 % (ref 34.0–46.6)
Hemoglobin: 13.5 g/dL (ref 11.1–15.9)
Immature Grans (Abs): 0 x10E3/uL (ref 0.0–0.1)
Immature Granulocytes: 0 %
Lymphocytes Absolute: 1.8 x10E3/uL (ref 0.7–3.1)
Lymphs: 24 %
MCH: 32.4 pg (ref 26.6–33.0)
MCHC: 34.6 g/dL (ref 31.5–35.7)
MCV: 94 fL (ref 79–97)
Monocytes Absolute: 0.5 x10E3/uL (ref 0.1–0.9)
Monocytes: 6 %
Neutrophils Absolute: 5.1 x10E3/uL (ref 1.4–7.0)
Neutrophils: 65 %
Platelets: 392 x10E3/uL (ref 150–450)
RBC: 4.17 x10E6/uL (ref 3.77–5.28)
RDW: 12.6 % (ref 11.7–15.4)
WBC: 7.7 x10E3/uL (ref 3.4–10.8)

## 2024-02-23 LAB — VITAMIN D 25 HYDROXY (VIT D DEFICIENCY, FRACTURES): Vit D, 25-Hydroxy: 60.7 ng/mL (ref 30.0–100.0)

## 2024-02-23 LAB — LIPID PANEL
Cholesterol, Total: 218 mg/dL — AB (ref 100–199)
HDL: 46 mg/dL (ref >39)
LDL CALC COMMENT:: 4.7 ratio — AB (ref 0.0–4.4)
LDL Chol Calc (NIH): 139 mg/dL — AB (ref 0–99)
Triglycerides: 184 mg/dL — AB (ref 0–149)
VLDL Cholesterol Cal: 33 mg/dL (ref 5–40)

## 2024-02-23 LAB — TSH: TSH: 0.916 u[IU]/mL (ref 0.450–4.500)

## 2024-02-24 ENCOUNTER — Ambulatory Visit: Payer: Self-pay | Admitting: Family Medicine

## 2024-02-24 ENCOUNTER — Encounter: Admitting: Nurse Practitioner

## 2024-02-29 DIAGNOSIS — F1320 Sedative, hypnotic or anxiolytic dependence, uncomplicated: Secondary | ICD-10-CM | POA: Diagnosis not present

## 2024-02-29 DIAGNOSIS — F330 Major depressive disorder, recurrent, mild: Secondary | ICD-10-CM | POA: Diagnosis not present

## 2024-02-29 DIAGNOSIS — F5101 Primary insomnia: Secondary | ICD-10-CM | POA: Diagnosis not present

## 2024-02-29 DIAGNOSIS — F411 Generalized anxiety disorder: Secondary | ICD-10-CM | POA: Diagnosis not present

## 2024-03-10 ENCOUNTER — Ambulatory Visit
Admission: RE | Admit: 2024-03-10 | Discharge: 2024-03-10 | Disposition: A | Discharge status: Home / Self Care | Source: Ambulatory Visit | Attending: Family Medicine | Admitting: Family Medicine

## 2024-03-10 DIAGNOSIS — Z1231 Encounter for screening mammogram for malignant neoplasm of breast: Secondary | ICD-10-CM

## 2024-03-23 ENCOUNTER — Ambulatory Visit: Admitting: Family Medicine

## 2024-03-23 ENCOUNTER — Encounter: Payer: Self-pay | Admitting: Family Medicine

## 2024-03-23 VITALS — BP 128/82 | HR 68 | Wt 164.0 lb

## 2024-03-23 DIAGNOSIS — E222 Syndrome of inappropriate secretion of antidiuretic hormone: Secondary | ICD-10-CM

## 2024-03-23 DIAGNOSIS — E871 Hypo-osmolality and hyponatremia: Secondary | ICD-10-CM

## 2024-03-23 MED ORDER — SODIUM CHLORIDE 1 G PO TABS: 1.0000 g | ORAL_TABLET | Freq: Two times a day (BID) | ORAL | 1 refills | Status: AC

## 2024-03-23 NOTE — Progress Notes (Signed)
 Name: Rita Stephens   Date of Visit: 03/23/24   Date of last visit with me: 02/18/2024   CHIEF COMPLAINT:  Chief Complaint  Patient presents with   other    6 week f/u on blood work doing pretty well,        HPI:  Discussed the use of AI scribe software for clinical note transcription with the patient, who gave verbal consent to proceed.  History of Present Illness   Rita Stephens is a 68 year old female who presents for evaluation of hyponatremia.  She is concerned about low sodium levels, identified during recent lab work, with a sodium level recorded at 128 mEq/L, below the normal range of 134 mEq/L. She has a history of low sodium levels, previously reaching as low as 121 mEq/L in 2016, at which time a kidney specialist told her she had 'brand new kidney failure.' At that time, she was treated with prednisone, which led to significant weight gain, after which she discontinued the medication.  She has been on Seroquel  for four to five years and recently started Zoloft one to two months ago. Her sodium levels have been affected by medications in the past, including blood pressure medications, which she stopped taking six weeks ago.  She uses Seroquel  as a sleep aid, as other medications like Ambien and melatonin were ineffective or caused allergic reactions. She is currently using Zoloft to help wean off Xanax , which previously led to a seizure in 2016. 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  Her current medications include Seroquel  and Zoloft. She expresses concern about her heart condition and weight, noting that she is heavier than she has been in the past.         OBJECTIVE:       02/18/2024    2:11 PM  Depression screen PHQ 2/9  Decreased Interest 1  Down, Depressed, Hopeless 0  PHQ - 2 Score 1     BP Readings from Last 3 Encounters:   03/23/24 128/82  02/18/24 122/78  01/18/24 126/86    BP 128/82   Pulse 68   Wt 164 lb (74.4 kg)   BMI 32.03 kg/m    Physical Exam          Physical Exam Constitutional:      Appearance: Normal appearance.  Neurological:     General: No focal deficit present.     Mental Status: She is alert and oriented to person, place, and time. Mental status is at baseline.     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 Sodium level at 128 mEq/L. Current medications effective for mental health, discontinuation not preferred. Risk of severe hyponatremia if untreated. - Prescribed sodium chloride  tablets, 1 gram, twice daily. - Repeat sodium level in one month to assess response. - Continue Seroquel  and Zoloft.      Hyponatremia - Discussed with patient that I believe we may need to eventually wean off of both the Zoloft and Seroquel  given how well over 70 minutes. - Patient states that she would like to keep these medications on for now as we are weaning her off her benzo usage. -  We will go ahead and do the sodium chloride  tablets in the meantime.   Rita Tates A. Vita MD [facility]

## 2024-04-26 ENCOUNTER — Ambulatory Visit: Admitting: Family Medicine

## 2024-04-26 VITALS — BP 122/76 | HR 78 | Wt 168.4 lb

## 2024-04-26 DIAGNOSIS — I482 Chronic atrial fibrillation, unspecified: Secondary | ICD-10-CM | POA: Diagnosis not present

## 2024-04-26 DIAGNOSIS — M62830 Muscle spasm of back: Secondary | ICD-10-CM

## 2024-04-26 DIAGNOSIS — M545 Low back pain, unspecified: Secondary | ICD-10-CM | POA: Diagnosis not present

## 2024-04-26 DIAGNOSIS — R7303 Prediabetes: Secondary | ICD-10-CM | POA: Diagnosis not present

## 2024-04-26 DIAGNOSIS — E871 Hypo-osmolality and hyponatremia: Secondary | ICD-10-CM

## 2024-04-26 DIAGNOSIS — E222 Syndrome of inappropriate secretion of antidiuretic hormone: Secondary | ICD-10-CM

## 2024-04-26 MED ORDER — CYCLOBENZAPRINE HCL 10 MG PO TABS: 10.0000 mg | ORAL_TABLET | Freq: Three times a day (TID) | ORAL | 0 refills | Status: AC | PRN

## 2024-04-26 MED ORDER — TIRZEPATIDE 2.5 MG/0.5ML ~~LOC~~ SOAJ: 2.5000 mg | SUBCUTANEOUS | 1 refills | Status: AC

## 2024-04-26 NOTE — Progress Notes (Signed)
 "  Name: Rita Stephens   Date of Visit: 04/26/2024   Date of last visit with me: 03/23/2024   CHIEF COMPLAINT:  Chief Complaint  Patient presents with   Follow-up    1 month follow up.  Might be allergic to new medication sodium chloride .        HPI:  Discussed the use of AI scribe software for clinical note transcription with the patient, who gave verbal consent to proceed.  History of Present Illness   Rita Stephens is a 69 year old female who presents with medication side effects and back pain.  She experiences blisters on her feet and peeling skin, which she associates with a new medication prescribed for her low sodium levels. The blisters resemble sunburn with 'whelks'. Despite these side effects, she continues taking two pills a day, which she believes contribute to her symptoms. She also experiences hot flashes and has difficulty sleeping.  She has a history of chronic low sodium and takes medications including quetiapine , sertraline, and Keppra . She recently started sertraline as she transitions off Xanax . She feels slightly more energetic and was able to engage in activities such as running with her sister during a recent visit, which involved a long drive.  She has a history of broken heart syndrome and atrial fibrillation, with a scheduled cardiologist appointment in February. She expresses interest in a GLP-1 medication for weight loss, noting her daughter is also taking it.  She reports chronic back pain, attributed to kyphosis, a bulging disc, and degenerative disc disease diagnosed in 2012. She experiences localized pain without numbness or tingling down the legs. She manages the pain with over-the-counter medications like Tylenol  and ibuprofen .         OBJECTIVE:       02/18/2024    2:11 PM  Depression screen PHQ 2/9  Decreased Interest 1  Down, Depressed, Hopeless 0  PHQ - 2 Score 1     BP Readings from Last 3 Encounters:  04/26/24 122/76  03/23/24 128/82   02/18/24 122/78    BP 122/76   Pulse 78   Wt 168 lb 6.4 oz (76.4 kg)   SpO2 94%   BMI 32.89 kg/m    Physical Exam          Physical Exam Constitutional:      Appearance: Normal appearance.  Neurological:     General: No focal deficit present.     Mental Status: She is alert and oriented to person, place, and time. Mental status is at baseline.     ASSESSMENT/PLAN:   Assessment & Plan Hyponatremia  SIADH (syndrome of inappropriate ADH production)  Chronic atrial fibrillation (HCC)  Prediabetes  Lumbar pain  Back spasm    Assessment and Plan    Hyponatremia due to SIADH Chronic hyponatremia likely secondary to SIADH, exacerbated by quetiapine  and sertraline. Balancing sodium intake is challenging due to potential impact on blood pressure. - Reduced sodium supplementation to one pill per day. - Ordered labs to reassess sodium levels. - Monitor blood pressure and adjust treatment as necessary.  Chronic atrial fibrillation Recent episode of broken heart syndrome. Cardiologist follow-up scheduled for February. Insurance coverage for GLP-1 medication is uncertain due to lack of diabetes diagnosis. - Sent prescription for GLP-1 medication and attempted to obtain insurance approval.  Chronic lumbar pain with degenerative disc disease Chronic lumbar pain due to degenerative disc disease, kyphosis, and bulging disc. No neurological symptoms. Pain managed conservatively. - Recommended Tylenol  or ibuprofen  for  pain management. - Encouraged walking and exercise to maintain mobility and manage pain.         Rita Stephens A. Vita MD [facility] "

## 2024-04-27 ENCOUNTER — Ambulatory Visit: Payer: Self-pay | Admitting: Family Medicine

## 2024-04-27 LAB — BASIC METABOLIC PANEL WITH GFR
BUN/Creatinine Ratio: 8 — ABNORMAL LOW (ref 12–28)
BUN: 6 mg/dL — ABNORMAL LOW (ref 8–27)
CO2: 21 mmol/L (ref 20–29)
Calcium: 9.3 mg/dL (ref 8.7–10.3)
Chloride: 97 mmol/L (ref 96–106)
Creatinine, Ser: 0.76 mg/dL (ref 0.57–1.00)
Glucose: 103 mg/dL — ABNORMAL HIGH (ref 70–99)
Potassium: 4.1 mmol/L (ref 3.5–5.2)
Sodium: 133 mmol/L — ABNORMAL LOW (ref 134–144)
eGFR: 86 mL/min/1.73

## 2024-05-09 ENCOUNTER — Encounter (INDEPENDENT_AMBULATORY_CARE_PROVIDER_SITE_OTHER): Payer: Self-pay | Admitting: *Deleted

## 2024-06-17 ENCOUNTER — Ambulatory Visit: Admitting: Nurse Practitioner

## 2024-07-27 ENCOUNTER — Ambulatory Visit: Admitting: Neurology

## 2024-08-18 ENCOUNTER — Ambulatory Visit: Admitting: Family Medicine

## 2024-12-29 ENCOUNTER — Ambulatory Visit (HOSPITAL_BASED_OUTPATIENT_CLINIC_OR_DEPARTMENT_OTHER)
